# Patient Record
Sex: Male | Born: 1955
Health system: Southern US, Community
[De-identification: ages and names within clinical notes are randomized; demographics above are authoritative.]

## PROBLEM LIST (undated history)

## (undated) DIAGNOSIS — I255 Ischemic cardiomyopathy: Secondary | ICD-10-CM

## (undated) DIAGNOSIS — E785 Hyperlipidemia, unspecified: Secondary | ICD-10-CM

## (undated) DIAGNOSIS — Z95810 Presence of automatic (implantable) cardiac defibrillator: Secondary | ICD-10-CM

## (undated) DIAGNOSIS — I10 Essential (primary) hypertension: Secondary | ICD-10-CM

## (undated) DIAGNOSIS — H547 Unspecified visual loss: Secondary | ICD-10-CM

## (undated) DIAGNOSIS — I5022 Chronic systolic (congestive) heart failure: Secondary | ICD-10-CM

## (undated) DIAGNOSIS — I2510 Atherosclerotic heart disease of native coronary artery without angina pectoris: Secondary | ICD-10-CM

## (undated) DIAGNOSIS — F32A Depression, unspecified: Secondary | ICD-10-CM

## (undated) DIAGNOSIS — F329 Major depressive disorder, single episode, unspecified: Secondary | ICD-10-CM

## (undated) DIAGNOSIS — F819 Developmental disorder of scholastic skills, unspecified: Secondary | ICD-10-CM

## (undated) HISTORY — DX: Essential (primary) hypertension: I10

## (undated) HISTORY — DX: Ischemic cardiomyopathy: I25.5

## (undated) HISTORY — DX: Major depressive disorder, single episode, unspecified: F32.9

## (undated) HISTORY — PX: HAND SURGERY: SHX662

## (undated) HISTORY — DX: Atherosclerotic heart disease of native coronary artery without angina pectoris: I25.10

## (undated) HISTORY — DX: Depression, unspecified: F32.A

## (undated) HISTORY — DX: Hyperlipidemia, unspecified: E78.5

## (undated) HISTORY — DX: Chronic systolic (congestive) heart failure: I50.22

## (undated) HISTORY — DX: Unspecified visual loss: H54.7

## (undated) HISTORY — DX: Presence of automatic (implantable) cardiac defibrillator: Z95.810

## (undated) HISTORY — DX: Developmental disorder of scholastic skills, unspecified: F81.9

---

## 2009-03-07 HISTORY — PX: OTHER SURGICAL HISTORY: SHX169

## 2009-04-03 ENCOUNTER — Ambulatory Visit: Payer: Self-pay | Admitting: Cardiovascular Disease

## 2009-04-03 ENCOUNTER — Inpatient Hospital Stay (HOSPITAL_COMMUNITY): Admission: EM | Admit: 2009-04-03 | Discharge: 2009-04-07 | Payer: Self-pay | Admitting: Cardiology

## 2009-04-04 ENCOUNTER — Encounter: Payer: Self-pay | Admitting: Cardiology

## 2009-04-16 ENCOUNTER — Encounter: Payer: Self-pay | Admitting: Cardiology

## 2009-04-17 ENCOUNTER — Ambulatory Visit: Payer: Self-pay | Admitting: Cardiology

## 2009-04-17 DIAGNOSIS — E1110 Type 2 diabetes mellitus with ketoacidosis without coma: Secondary | ICD-10-CM | POA: Insufficient documentation

## 2009-04-17 DIAGNOSIS — I255 Ischemic cardiomyopathy: Secondary | ICD-10-CM

## 2009-04-19 LAB — CONVERTED CEMR LAB
BUN: 20 mg/dL (ref 6–23)
CO2: 28 meq/L (ref 19–32)
Calcium: 9.1 mg/dL (ref 8.4–10.5)
Chloride: 102 meq/L (ref 96–112)
Creatinine, Ser: 1 mg/dL (ref 0.4–1.5)
GFR calc non Af Amer: 82.93 mL/min (ref >60)
Glucose, Bld: 90 mg/dL (ref 70–99)
Potassium: 4.7 meq/L (ref 3.5–5.1)
Sodium: 136 meq/L (ref 135–145)

## 2009-04-23 ENCOUNTER — Telehealth (INDEPENDENT_AMBULATORY_CARE_PROVIDER_SITE_OTHER): Payer: Self-pay | Admitting: *Deleted

## 2009-04-24 ENCOUNTER — Ambulatory Visit: Payer: Self-pay | Admitting: Cardiology

## 2009-04-25 ENCOUNTER — Telehealth: Payer: Self-pay | Admitting: Cardiology

## 2009-05-05 ENCOUNTER — Encounter: Payer: Self-pay | Admitting: Cardiology

## 2009-05-06 ENCOUNTER — Ambulatory Visit: Payer: Self-pay

## 2009-05-06 ENCOUNTER — Ambulatory Visit: Payer: Self-pay | Admitting: Cardiology

## 2009-05-06 DIAGNOSIS — I10 Essential (primary) hypertension: Secondary | ICD-10-CM

## 2009-05-14 ENCOUNTER — Telehealth (INDEPENDENT_AMBULATORY_CARE_PROVIDER_SITE_OTHER): Payer: Self-pay | Admitting: *Deleted

## 2009-05-14 LAB — CONVERTED CEMR LAB
BUN: 15 mg/dL (ref 6–23)
CO2: 29 meq/L (ref 19–32)
Calcium: 9.4 mg/dL (ref 8.4–10.5)
Chloride: 107 meq/L (ref 96–112)
Creatinine, Ser: 0.9 mg/dL (ref 0.4–1.5)
GFR calc non Af Amer: 93.63 mL/min (ref >60)
Glucose, Bld: 134 mg/dL — ABNORMAL HIGH (ref 70–99)
Potassium: 4.6 meq/L (ref 3.5–5.1)
Sodium: 140 meq/L (ref 135–145)

## 2009-05-20 ENCOUNTER — Telehealth: Payer: Self-pay | Admitting: Cardiology

## 2009-05-20 ENCOUNTER — Ambulatory Visit: Payer: Self-pay | Admitting: Cardiology

## 2009-05-20 DIAGNOSIS — E785 Hyperlipidemia, unspecified: Secondary | ICD-10-CM

## 2009-05-30 ENCOUNTER — Encounter: Payer: Self-pay | Admitting: Cardiology

## 2009-06-06 ENCOUNTER — Ambulatory Visit: Payer: Self-pay | Admitting: Cardiology

## 2009-06-06 ENCOUNTER — Encounter: Payer: Self-pay | Admitting: Cardiology

## 2009-06-06 ENCOUNTER — Telehealth (INDEPENDENT_AMBULATORY_CARE_PROVIDER_SITE_OTHER): Payer: Self-pay | Admitting: *Deleted

## 2009-06-06 DIAGNOSIS — I2510 Atherosclerotic heart disease of native coronary artery without angina pectoris: Secondary | ICD-10-CM

## 2009-06-06 DIAGNOSIS — I5022 Chronic systolic (congestive) heart failure: Secondary | ICD-10-CM

## 2009-06-11 LAB — CONVERTED CEMR LAB
Alkaline Phosphatase: 43 units/L (ref 39–117)
BUN: 17 mg/dL (ref 6–23)
Bilirubin, Direct: 0 mg/dL (ref 0.0–0.3)
CO2: 28 meq/L (ref 19–32)
Chloride: 102 meq/L (ref 96–112)
Creatinine, Ser: 0.9 mg/dL (ref 0.4–1.5)
Glucose, Bld: 105 mg/dL — ABNORMAL HIGH (ref 70–99)
LDL Cholesterol: 35 mg/dL (ref 0–99)
Total Bilirubin: 0.7 mg/dL (ref 0.3–1.2)
Total CHOL/HDL Ratio: 2

## 2009-06-27 ENCOUNTER — Telehealth (INDEPENDENT_AMBULATORY_CARE_PROVIDER_SITE_OTHER): Payer: Self-pay | Admitting: *Deleted

## 2009-07-04 ENCOUNTER — Encounter: Payer: Self-pay | Admitting: Cardiology

## 2009-07-10 ENCOUNTER — Telehealth: Payer: Self-pay | Admitting: Cardiology

## 2009-07-17 ENCOUNTER — Telehealth (INDEPENDENT_AMBULATORY_CARE_PROVIDER_SITE_OTHER): Payer: Self-pay | Admitting: *Deleted

## 2009-07-18 ENCOUNTER — Encounter: Payer: Self-pay | Admitting: Cardiology

## 2009-07-22 ENCOUNTER — Telehealth: Payer: Self-pay | Admitting: Cardiology

## 2009-07-22 ENCOUNTER — Encounter: Payer: Self-pay | Admitting: Cardiology

## 2009-07-22 ENCOUNTER — Telehealth (INDEPENDENT_AMBULATORY_CARE_PROVIDER_SITE_OTHER): Payer: Self-pay | Admitting: *Deleted

## 2009-08-14 ENCOUNTER — Ambulatory Visit: Payer: Self-pay | Admitting: Cardiology

## 2009-09-20 ENCOUNTER — Telehealth (INDEPENDENT_AMBULATORY_CARE_PROVIDER_SITE_OTHER): Payer: Self-pay | Admitting: *Deleted

## 2009-10-10 ENCOUNTER — Telehealth: Payer: Self-pay | Admitting: Cardiology

## 2009-10-14 ENCOUNTER — Telehealth: Payer: Self-pay | Admitting: Cardiology

## 2009-10-14 ENCOUNTER — Encounter: Payer: Self-pay | Admitting: Cardiology

## 2009-10-17 ENCOUNTER — Telehealth: Payer: Self-pay | Admitting: Cardiology

## 2009-11-07 ENCOUNTER — Encounter: Payer: Self-pay | Admitting: Cardiology

## 2009-11-15 ENCOUNTER — Telehealth: Payer: Self-pay | Admitting: Cardiology

## 2009-11-25 ENCOUNTER — Telehealth (INDEPENDENT_AMBULATORY_CARE_PROVIDER_SITE_OTHER): Payer: Self-pay | Admitting: *Deleted

## 2009-11-25 ENCOUNTER — Ambulatory Visit: Payer: Self-pay | Admitting: Cardiology

## 2009-12-02 LAB — CONVERTED CEMR LAB
Bilirubin, Direct: 0.1 mg/dL (ref 0.0–0.3)
Cholesterol: 92 mg/dL (ref 0–200)
LDL Cholesterol: 29 mg/dL (ref 0–99)
Total Bilirubin: 0.6 mg/dL (ref 0.3–1.2)
Total CHOL/HDL Ratio: 2
Total Protein: 7 g/dL (ref 6.0–8.3)
VLDL: 19.2 mg/dL (ref 0.0–40.0)

## 2009-12-11 ENCOUNTER — Telehealth (INDEPENDENT_AMBULATORY_CARE_PROVIDER_SITE_OTHER): Payer: Self-pay | Admitting: *Deleted

## 2009-12-12 ENCOUNTER — Encounter: Payer: Self-pay | Admitting: Cardiology

## 2009-12-16 ENCOUNTER — Telehealth: Payer: Self-pay | Admitting: Cardiology

## 2009-12-18 ENCOUNTER — Encounter: Payer: Self-pay | Admitting: Cardiology

## 2010-02-24 ENCOUNTER — Telehealth (INDEPENDENT_AMBULATORY_CARE_PROVIDER_SITE_OTHER): Payer: Self-pay | Admitting: *Deleted

## 2010-02-24 ENCOUNTER — Ambulatory Visit: Payer: Self-pay | Admitting: Cardiology

## 2010-02-27 ENCOUNTER — Telehealth: Payer: Self-pay | Admitting: Cardiology

## 2010-03-13 ENCOUNTER — Telehealth: Payer: Self-pay | Admitting: Cardiology

## 2010-03-14 ENCOUNTER — Ambulatory Visit: Payer: Self-pay | Admitting: Cardiology

## 2010-03-14 LAB — CONVERTED CEMR LAB
BUN: 26 mg/dL — ABNORMAL HIGH (ref 6–23)
Chloride: 106 meq/L (ref 96–112)
Potassium: 4.6 meq/L (ref 3.5–5.1)
Pro B Natriuretic peptide (BNP): 45.4 pg/mL (ref 0.0–100.0)

## 2010-03-31 ENCOUNTER — Telehealth: Payer: Self-pay | Admitting: Cardiology

## 2010-04-21 ENCOUNTER — Telehealth: Payer: Self-pay | Admitting: Cardiology

## 2010-05-05 ENCOUNTER — Telehealth: Payer: Self-pay | Admitting: Cardiology

## 2010-05-29 ENCOUNTER — Encounter: Payer: Self-pay | Admitting: Cardiology

## 2010-05-29 ENCOUNTER — Ambulatory Visit (HOSPITAL_COMMUNITY): Admission: RE | Admit: 2010-05-29 | Discharge: 2010-05-29 | Payer: Self-pay | Admitting: Cardiology

## 2010-05-29 ENCOUNTER — Ambulatory Visit: Payer: Self-pay | Admitting: Internal Medicine

## 2010-05-29 ENCOUNTER — Ambulatory Visit: Payer: Self-pay | Admitting: Cardiology

## 2010-05-29 ENCOUNTER — Ambulatory Visit: Payer: Self-pay

## 2010-06-11 ENCOUNTER — Encounter (INDEPENDENT_AMBULATORY_CARE_PROVIDER_SITE_OTHER): Payer: Self-pay | Admitting: *Deleted

## 2010-06-17 ENCOUNTER — Encounter: Payer: Self-pay | Admitting: Cardiology

## 2010-06-23 ENCOUNTER — Telehealth: Payer: Self-pay | Admitting: Cardiology

## 2010-06-23 ENCOUNTER — Ambulatory Visit: Payer: Self-pay | Admitting: Cardiovascular Disease

## 2010-06-23 ENCOUNTER — Ambulatory Visit (HOSPITAL_COMMUNITY): Admission: RE | Admit: 2010-06-23 | Discharge: 2010-06-23 | Payer: Self-pay | Admitting: Cardiology

## 2010-06-27 ENCOUNTER — Ambulatory Visit: Payer: Self-pay | Admitting: Internal Medicine

## 2010-06-30 ENCOUNTER — Telehealth: Payer: Self-pay | Admitting: Internal Medicine

## 2010-07-01 ENCOUNTER — Encounter: Payer: Self-pay | Admitting: Cardiology

## 2010-07-01 ENCOUNTER — Encounter: Payer: Self-pay | Admitting: Internal Medicine

## 2010-07-01 ENCOUNTER — Ambulatory Visit (HOSPITAL_BASED_OUTPATIENT_CLINIC_OR_DEPARTMENT_OTHER): Admission: RE | Admit: 2010-07-01 | Discharge: 2010-07-01 | Payer: Self-pay | Admitting: Cardiology

## 2010-07-02 ENCOUNTER — Encounter: Payer: Self-pay | Admitting: Internal Medicine

## 2010-07-08 ENCOUNTER — Ambulatory Visit: Payer: Self-pay | Admitting: Internal Medicine

## 2010-07-09 ENCOUNTER — Ambulatory Visit: Payer: Self-pay | Admitting: Pulmonary Disease

## 2010-07-11 ENCOUNTER — Telehealth: Payer: Self-pay | Admitting: Cardiology

## 2010-07-11 ENCOUNTER — Ambulatory Visit: Payer: Self-pay | Admitting: Internal Medicine

## 2010-07-11 LAB — CONVERTED CEMR LAB
Basophils Relative: 0.3 % (ref 0.0–3.0)
CO2: 26 meq/L (ref 19–32)
Chloride: 102 meq/L (ref 96–112)
Creatinine, Ser: 0.8 mg/dL (ref 0.4–1.5)
Eosinophils Absolute: 0.2 10*3/uL (ref 0.0–0.7)
Hemoglobin: 13.9 g/dL (ref 13.0–17.0)
Lymphs Abs: 2.1 10*3/uL (ref 0.7–4.0)
MCHC: 34.2 g/dL (ref 30.0–36.0)
MCV: 96.4 fL (ref 78.0–100.0)
Monocytes Absolute: 0.5 10*3/uL (ref 0.1–1.0)
Neutro Abs: 3.4 10*3/uL (ref 1.4–7.7)
RBC: 4.22 M/uL (ref 4.22–5.81)

## 2010-07-14 ENCOUNTER — Telehealth: Payer: Self-pay | Admitting: Cardiology

## 2010-07-14 DIAGNOSIS — G4730 Sleep apnea, unspecified: Secondary | ICD-10-CM | POA: Insufficient documentation

## 2010-07-18 ENCOUNTER — Ambulatory Visit (HOSPITAL_COMMUNITY): Admission: RE | Admit: 2010-07-18 | Discharge: 2010-07-19 | Payer: Self-pay | Admitting: Internal Medicine

## 2010-07-18 ENCOUNTER — Ambulatory Visit: Payer: Self-pay | Admitting: Internal Medicine

## 2010-07-18 HISTORY — PX: OTHER SURGICAL HISTORY: SHX169

## 2010-07-22 ENCOUNTER — Telehealth: Payer: Self-pay | Admitting: Cardiology

## 2010-07-23 ENCOUNTER — Encounter: Payer: Self-pay | Admitting: Internal Medicine

## 2010-07-24 ENCOUNTER — Telehealth (INDEPENDENT_AMBULATORY_CARE_PROVIDER_SITE_OTHER): Payer: Self-pay | Admitting: *Deleted

## 2010-07-30 ENCOUNTER — Encounter: Payer: Self-pay | Admitting: Internal Medicine

## 2010-07-30 ENCOUNTER — Ambulatory Visit: Payer: Self-pay

## 2010-08-13 ENCOUNTER — Ambulatory Visit: Payer: Self-pay | Admitting: Pulmonary Disease

## 2010-08-13 DIAGNOSIS — I219 Acute myocardial infarction, unspecified: Secondary | ICD-10-CM | POA: Insufficient documentation

## 2010-08-13 DIAGNOSIS — G4733 Obstructive sleep apnea (adult) (pediatric): Secondary | ICD-10-CM

## 2010-08-28 ENCOUNTER — Telehealth: Payer: Self-pay | Admitting: Internal Medicine

## 2010-09-12 ENCOUNTER — Encounter: Payer: Self-pay | Admitting: Pulmonary Disease

## 2010-09-17 ENCOUNTER — Ambulatory Visit
Admission: RE | Admit: 2010-09-17 | Discharge: 2010-09-17 | Payer: Self-pay | Source: Home / Self Care | Attending: Cardiology | Admitting: Cardiology

## 2010-09-17 ENCOUNTER — Other Ambulatory Visit: Payer: Self-pay | Admitting: Cardiology

## 2010-09-17 ENCOUNTER — Encounter: Payer: Self-pay | Admitting: Cardiology

## 2010-09-17 ENCOUNTER — Encounter: Payer: Self-pay | Admitting: Internal Medicine

## 2010-09-17 LAB — BASIC METABOLIC PANEL
BUN: 16 mg/dL (ref 6–23)
CO2: 28 mEq/L (ref 19–32)
Calcium: 9.1 mg/dL (ref 8.4–10.5)
Chloride: 104 mEq/L (ref 96–112)
Creatinine, Ser: 0.7 mg/dL (ref 0.4–1.5)
GFR: 124.49 mL/min (ref >60.00)
Glucose, Bld: 164 mg/dL — ABNORMAL HIGH (ref 70–99)
Potassium: 4.8 mEq/L (ref 3.5–5.1)
Sodium: 140 mEq/L (ref 135–145)

## 2010-09-17 LAB — BRAIN NATRIURETIC PEPTIDE: Pro B Natriuretic peptide (BNP): 39.6 pg/mL (ref 0.0–100.0)

## 2010-09-25 ENCOUNTER — Telehealth (INDEPENDENT_AMBULATORY_CARE_PROVIDER_SITE_OTHER): Payer: Self-pay | Admitting: *Deleted

## 2010-09-25 ENCOUNTER — Telehealth: Payer: Self-pay | Admitting: Cardiology

## 2010-10-06 ENCOUNTER — Ambulatory Visit
Admission: RE | Admit: 2010-10-06 | Discharge: 2010-10-06 | Payer: Self-pay | Source: Home / Self Care | Attending: Pulmonary Disease | Admitting: Pulmonary Disease

## 2010-10-06 ENCOUNTER — Telehealth (INDEPENDENT_AMBULATORY_CARE_PROVIDER_SITE_OTHER): Payer: Self-pay | Admitting: *Deleted

## 2010-10-07 NOTE — Progress Notes (Signed)
Summary: req to speak to nurse  Phone Note Call from Patient Call back at [phone]   Caller: Spouse Reason for Call: Talk to Nurse Summary of Call: req to speak to nurse Initial call taken by: Migdalia Dk,  October 14, 2009 10:17 AM  Follow-up for Phone Call        aware Plavix has been ordered thru [facility] and should be here soon. Follow-up by: Charolotte Capuchin, RN,  October 14, 2009 10:46 AM

## 2010-10-07 NOTE — Assessment & Plan Note (Signed)
Summary: 3 month rov echo at 9:30   Visit Type:  3 months follow up Primary Latasia Silberstein:  Dr. Andrey Campanile, [facility])  CC:  Some chest pains.  History of Present Illness: 55 yo presents for followup after anterior MI and development of ischemic cardiomyopathy.  Patient came to [facility] 7/10 with an acute anterior MI.  He had a totally occluded proximal LAD.  This was opened with a Xience DES. Repeat echo today showed EF 35-40% with apical and anterior/anterolatera/anteroseptal hypokinesis.  He is stable symptomatically.  He has some exertional dyspnea that tends to be brought on by carrying a heavy bag of groceries into his house or walking more than about 1/4 mile and does not occur with less significant exertion.  He has occasional quite atypical chest pain.   He does report fatigue, daytime sleepiness, and snoring.  He gasps and stops breathing on occasion in his sleep.    Labs (9/10): BNP 103, HDL 44, LDL 35 Labs (10/10): K 4.1, creatinine 0.75 Labs (3/11): LDL 29, HDL 44, LFTs normal Labs (1/02): K 4.7, creatinine 0.88 Labs (7/11): K 4.6, creatinine 0.7, BNP 45  ECG: NSR, old ASMI  Current Medications (verified): 1)  Aspirin 325 Mg Tabs (Aspirin) .... Once Daily 2)  Coreg 12.5 Mg Tabs (Carvedilol) .... One and One-Half Twice A Day 3)  Plavix 75 Mg Tabs (Clopidogrel Bisulfate) .... Take One Tablet Once Daily 4)  Enalapril Maleate 10 Mg Tabs (Enalapril Maleate) .... One Tablet Twice A Day 5)  Furosemide 40 Mg Tabs (Furosemide) .... (1/2) Every Day 6)  Glipizide 10 Mg Tabs (Glipizide) .... Take One Tablet Two Times A Day 7)  Nitroglycerin 0.4 Mg Subl (Nitroglycerin) .... As Needed 8)  Crestor 40 Mg Tabs (Rosuvastatin Calcium) .... One Daily 9)  Spironolactone 25 Mg Tabs (Spironolactone) .... Once Daily 10)  Metformin Hcl 500 Mg Tabs (Metformin Hcl) .... 2 Daily 11)  Flaxseed Oil 1200 Mg Caps (Flaxseed (Linseed)) .... Take One Capsule Once Daily 12)  Sertraline Hcl 50 Mg Tabs  (Sertraline Hcl) .... Take One Tablet At Bedtime  Allergies (verified): No Known Drug Allergies  Past History:  Past Medical History: 1.  CAD: Anterior STEMI 04/03/09.  3.0 x 15 mm Xience DES to totally occluded proximal LAD.  No obstructive disease elsewhere.   2.  Ischemic CMP: Echo (04/04/09) with EF 25-30%, anteroseptal, anterior, anterolateral, and apical akinesis.  Followup echo (9/10) with EF 35-40%, apical and anteroseptal hypokinesis, mild MR, grade II diastolic dysfunction.  3.  Diabetes mellitus 4.  Learning disability 5.  Hyperlipidemia: myalgias with Lipitor 6.  Depression: Seeing a psychiatrist in [place] 7.  Suspect OSA  Family History: Reviewed history from 04/24/2009 and no changes required. Father died with a history of diabetes, but no history of coronary artery disease.  Mother had some heart problems-unspecified Brother died of an MI at age 83  Social History: Reviewed history from 08/14/2009 and no changes required. Tobacco Use - No.  Alcohol Use - no Drug Use - no Lives in [place].   Review of Systems       All systems reviewed and negative except as per HPI.   Vital Signs:  Patient profile:   55 year old male Height:      68 inches Weight:      219 pounds BMI:     33.42 Pulse rate:   55 / minute Pulse rhythm:   regular Resp:     18 per minute BP sitting:  112 / 80  (left arm) Cuff size:   large  Vitals Entered By: Vikki Ports (May 29, 2010 10:30 AM)  Physical Exam  General:  Well developed, well nourished, in no acute distress. Neck:  Neck thick, no JVD. No masses, thyromegaly or abnormal cervical nodes. Lungs:  Clear bilaterally to auscultation and percussion. Heart:  Non-displaced PMI, chest non-tender; regular rate and rhythm, S1, S2 without murmurs, rubs or gallops. Carotid upstroke normal, no bruit.  Pedals normal pulses. No edema, no varicosities. Abdomen:  Bowel sounds positive; abdomen soft and non-tender without masses,  organomegaly, or hernias noted. No hepatosplenomegaly. Extremities:  No clubbing or cyanosis. Neurologic:  Alert and oriented x 3. Psych:  Normal affect.   Impression & Recommendations:  Problem # 1:  CHRONIC SYSTOLIC HEART FAILURE (ICD-428.22) Euvolemic with NYHA class II symptoms.  Continue current Lasix, Coreg, and spironolactone.  Will increase enalapril to 15 mg two times a day.  BMET in 2 wks.  EF on echo today was 35-40%.  He is borderline for ICD.  He now has insurance so will be able to get coverage for cardiac MRI.  Will arrange MRI to quantify EF, if < 35% will suggest ICD.   Problem # 2:  CORONARY ATHEROSCLEROSIS NATIVE CORONARY ARTERY (ICD-414.01) Atypical chest pain only.  - Continue ASA, Plavix, ACEI, statin, Coreg.  - Lipids/LFTs when he gets his BMET in 2 wks.  Goal LDL < 70.   Problem # 3:  POSSIBLE OSA Patient reports daytime sleepiness and fatigue.  His wife states that he snores loudly, gasps, and stops breathing while asleep.  Will arrange for sleep study.   Other Orders: Cardiac MRI (Cardiac MRI) Misc. Referral (Misc. Ref)  Patient Instructions: 1)  Your physician has recommended you make the following change in your medication:  2)  Increase Enalapril to 15mg  twice a day--this will be one and one-half 10mg  tablets twice a day. 3)  Your physician recommends that you return for a FASTING lipid profile/liver profile/BMP in 2 weeks---414.01  428.22-you have the order. 4)  Your physician has recommended that you have a sleep study.  This test records several body functions during sleep, including:  brain activity, eye movement, oxygen and carbon dioxide blood levels, heart rate and rhythm, breathing rate and rhythm, the flow of air through your mouth and nose, snoring, body muscle movements, and chest and belly movement. 5)  Your physician has requested that you have a cardiac MRI.  Cardiac MRI uses a computer to create images of your heart as it's beating, producing  both still and moving pictures of your heart and major blood vessels. For further information please visit  [email or web address]  Please follow the instruction sheet given to you today for more information. 6)  Your physician wants you to follow-up in: 4 months with Dr Shirlee Latch.  You will receive a reminder letter in the mail two months in advance. If you don't receive a letter, please call our office to schedule the follow-up appointment. Prescriptions: ENALAPRIL MALEATE 10 MG TABS (ENALAPRIL MALEATE) one and one- half  tablet twice a day  #90 x 6   Entered by:   Katina Dung, RN, BSN   Authorized by:   Marca Ancona, MD   Signed by:   Katina Dung, RN, BSN on 05/29/2010   Method used:   Electronically to        [facility]* (retail)       [address]  [place], [place]  [address]       Ph: [phone]       Fax: [phone]   RxID:   [id number]

## 2010-10-07 NOTE — Progress Notes (Signed)
Summary: discuss meds  Phone Note Call from Patient Call back at Home Phone [phone]   Caller: Patient Reason for Call: Talk to Nurse Details for Reason: Discuss meds.  Summary of Call: Will have dental surg. in the near future.  Need to discuss steps to come off blood thinners. Initial call taken by: Omar Person,  July 11, 2010 10:52 AM     Appended Document: discuss meds May stop Plavix peri-op, then restart.  Continue ASA 81 mg daily if possible.   Appended Document: discuss meds I talked with pt by telephone and gave him Dr Alford Highland recommendations--pt verbalized understanding

## 2010-10-07 NOTE — Progress Notes (Signed)
  pt signed ROI while in Office for records to be faxed to [facility] Health & Human Services sent to [employer]. Cala Bradford Mesiemore  February 24, 2010 9:51 AM

## 2010-10-07 NOTE — Assessment & Plan Note (Signed)
Summary: eval for icd per mclean/sl   Visit Type:  Initial Consult Referring Provider:  Dr Shirlee Latch Primary Provider:  Dr. Andrey Campanile, [facility])   History of Present Illness: Zachary Berry is a pleasant 55 yo WM with a h/o CAD s/p PCI, Ischemic CM (EF 34%) and NYHA Class II/III CHF who presents for EP consultation regarding risk stratefication for sudden death.  He reports being in good health until 7/10 when he had an acute anterior MI.  He had a totally occluded proximal LAD.  This was opened with a Xience DES. Repeat echo subsequently showed EF 35-40% with apical and anterior/anterolatera/anteroseptal hypokinesis.  MRI 06/23/10 revealed full thickness scar involving distal anterior wall, septum and apex with 2/3 rd thickness scar involving the mid and basal anterior wall.  His EF by MRI is 34%. Despite his ischemic CM, he appears to be doing reasonably well.  He does reports exertional dyspnea that tends to be brought on by carrying a heavy bag of groceries into his house or walking more than about 1/4 mile and does not occur with less significant exertion.  He has occasional quite atypical chest pain.   He does report fatigue, daytime sleepiness, and snoring.  He gasps and stops breathing on occasion in his sleep.  He has a sleep study scheduled.      Current Medications (verified): 1)  Aspirin 325 Mg Tabs (Aspirin) .... Once Daily 2)  Coreg 12.5 Mg Tabs (Carvedilol) .... One and One-Half Twice A Day 3)  Plavix 75 Mg Tabs (Clopidogrel Bisulfate) .... Take One Tablet Once Daily 4)  Enalapril Maleate 10 Mg Tabs (Enalapril Maleate) .... One and One- Half  Tablet Twice A Day 5)  Furosemide 40 Mg Tabs (Furosemide) .... (1/2) Every Day 6)  Glipizide 10 Mg Tabs (Glipizide) .... Take One Tablet Two Times A Day 7)  Nitroglycerin 0.4 Mg Subl (Nitroglycerin) .... As Needed 8)  Crestor 40 Mg Tabs (Rosuvastatin Calcium) .... One Daily 9)  Spironolactone 25 Mg Tabs (Spironolactone) .... Once  Daily 10)  Metformin Hcl 500 Mg Tabs (Metformin Hcl) .... 2 Daily 11)  Flaxseed Oil 1200 Mg Caps (Flaxseed (Linseed)) .... Take One Capsule Once Daily 12)  Sertraline Hcl 50 Mg Tabs (Sertraline Hcl) .... Take One Tablet At Bedtime  Allergies (verified): No Known Drug Allergies  Past History:  Past Medical History: 1.  CAD: Anterior STEMI 04/03/09.  3.0 x 15 mm Xience DES to totally occluded proximal LAD.  No obstructive disease elsewhere.   2.  Ischemic CMP: Echo (04/04/09) with EF 25-30%, anteroseptal, anterior, anterolateral, and apical akinesis.  Followup echo (9/10) with EF 35-40%, apical and anteroseptal hypokinesis, mild Zachary, grade II diastolic dysfunction.  3.  Diabetes mellitus 4.  Learning disability 5.  Hyperlipidemia: myalgias with Lipitor 6.  Depression: Seeing a psychiatrist in [place] 7.  Suspect OSA (awaiting sleep study) 8.  Very poor vision  Past Surgical History: PCI LAD 7/10 Hand Surgery  Family History: Father died with a history of diabetes, but no history of coronary artery disease.  Mother had some heart problems-unspecified Brother died of an MI at age 38.  Another brother had CABG at age 57  Social History: Tobacco Use - No.  Alcohol Use - no Drug Use - no Lives in [place].   Disabled due  CAD.  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  Review of Systems       All systems are reviewed and negative except  as listed in the HPI.   Vital Signs:  Patient profile:   55 year old male Height:      68 inches Weight:      225 pounds BMI:     34.33 Pulse rate:   65 / minute BP sitting:   102 / 60  (left arm)  Vitals Entered By: Laurance Flatten CMA (June 27, 2010 2:19 PM)  Physical Exam  General:  Well developed, well nourished, in no acute distress. Head:  normocephalic and atraumatic Eyes:  PERRLA/EOM intact; conjunctiva and lids normal. Mouth:  Teeth, gums and palate normal. Oral mucosa normal. Neck:   supple, JVP 8cm Lungs:  Clear bilaterally to auscultation and percussion. Heart:  Non-displaced PMI, chest non-tender; regular rate and rhythm, S1, S2 without murmurs, rubs or gallops. Carotid upstroke normal, no bruit. Normal abdominal aortic size, no bruits. Femorals normal pulses, no bruits. Pedals normal pulses. No edema, no varicosities. Abdomen:  Bowel sounds positive; abdomen soft and non-tender without masses, organomegaly, or hernias noted. No hepatosplenomegaly. Msk:  Back normal, normal gait. Muscle strength and tone normal. Pulses:  pulses normal in all 4 extremities Extremities:  No clubbing or cyanosis. Neurologic:  Alert and oriented x 3. Skin:  Intact without lesions or rashes. Cervical Nodes:  no significant adenopathy Psych:  Normal affect.   EKG  Procedure date:  05/29/2010  Findings:      sinus bradycardia 55 bpm, PR 172, QRS 94, QTc 410, anterior infarction  Impression & Recommendations:  Problem # 1:  CHRONIC SYSTOLIC HEART FAILURE (ICD-428.22) The patient has an ischemic CM (EF 34%), NYHA Class II/III CHF, and CAD.  He meets MADIT II/ SCD-HeFT criteria for ICD implantation for primary prevention of sudden death.  Risks, benefits, alternatives to ICD implantation were discussed in detail with the patient today.  He understands that the risks include but are not limited to bleeding, infection, pneumothorax, perforation, tamponade, vascular damage, renal failure, MI, stroke, death, inappropriate shocks, and lead dislodgement.  The patient wishes to further contemplate ICD implantation and will contact our office if he decides to proceed.  He is left handed and wishes to be able to shoot a gun with the butt of the gun over his L side.  Consideration would therefore be given to R sided device placement.  Given sinus bradycardia, I would plan dual chamber ICD implantation if he chooses to proceed.  Problem # 2:  ISCHEMIC CARDIOMYOPATHY (ICD-414.8) stable no changes  today  Problem # 3:  HYPERTENSION (ICD-401.9) stable  Patient Instructions: 1)  The patient will contact our office if he decided to have an ICD implanted.  He will otherwise continue to follow-up with Dr Shirlee Latch.

## 2010-10-07 NOTE — Progress Notes (Signed)
Summary: Plavix ready for pickup   Phone Note Outgoing Call   Call placed by: Katina Dung, RN, BSN,  April 21, 2010 10:22 AM Call placed to: Patient Summary of Call: Plavix ready for pick-up   Follow-up for Phone Call        received Plavix 75mg   #90 tablets from [facility]--pt aware at front desk ready for pick-up Katina Dung, RN, BSN  April 21, 2010 10:23 AM

## 2010-10-07 NOTE — Progress Notes (Signed)
Summary: deined for disability  Phone Note Call from Patient Call back at Home Phone [phone]   Caller: Patient Reason for Call: Talk to Nurse Summary of Call: per pt called, pt wanted anne to know he was  denied for his disability.  Initial call taken by: Lorne Skeens,  February 27, 2010 3:54 PM  Follow-up for Phone Call        left message to call back. Dossie Arbour, RN, BSN  February 27, 2010 3:58 PM  returning call, Migdalia Dk  February 27, 2010 4:02 PM   I spoke with the pt. He just wanted Thurston Hole to know he was denied for Social Security Disability. I explained to the pt we would forward this message to [place]. Follow-up by: Sherri Rad, RN, BSN,  February 27, 2010 4:14 PM  Additional Follow-up for Phone Call Additional follow up Details #1::        talked with pt-he was denied for Social Security disability  but is still waiting to hear about Medicaid and completed paperwork and sent into [facility] billing for help with his medical bills

## 2010-10-07 NOTE — Medication Information (Signed)
Summary: Plavix  Plavix   Imported By: Marylou Mccoy 10/31/2009 16:58:09  _____________________________________________________________________  External Attachment:    Type:   Image     Comment:   External Document

## 2010-10-07 NOTE — Procedures (Signed)
Summary: wound ck 10 day/mt   Current Medications (verified): 1)  Aspirin 325 Mg Tabs (Aspirin) .... Once Daily 2)  Coreg 12.5 Mg Tabs (Carvedilol) .... One and One-Half Twice A Day 3)  Plavix 75 Mg Tabs (Clopidogrel Bisulfate) .... Take One Tablet Once Daily 4)  Enalapril Maleate 10 Mg Tabs (Enalapril Maleate) .... One and One- Half  Tablet Twice A Day 5)  Furosemide 40 Mg Tabs (Furosemide) .... (1/2) Every Day 6)  Glipizide 10 Mg Tabs (Glipizide) .... Take One Tablet Two Times A Day 7)  Nitroglycerin 0.4 Mg Subl (Nitroglycerin) .... As Needed 8)  Crestor 40 Mg Tabs (Rosuvastatin Calcium) .... One Daily 9)  Spironolactone 25 Mg Tabs (Spironolactone) .... Once Daily 10)  Metformin Hcl1000 Mg Tabs (Metformin Hcl) .... One By Mouth Two Times A Day 11)  Flaxseed Oil 1200 Mg Caps (Flaxseed (Linseed)) .... Take One Capsule Once Daily 12)  Sertraline Hcl 50 Mg Tabs (Sertraline Hcl) .... Take One Tablet At Bedtime  Allergies (verified): No Known Drug Allergies   ICD Specifications Following MD:  Hillis Range, MD     ICD Vendor:  [employer]     ICD Model Number:  [id number]     ICD Serial Number:  [id number] H ICD DOI:  07/18/2010     ICD Implanting MD:  Hillis Range, MD  Lead 1:    Location: RA     DOI: 07/18/2010     Model #: 4098     Serial #: [id number]     Status: active Lead 2:    Location: RV     DOI: 07/18/2010     Model #: 6213     Serial #: [id number]     Status: active  Indications::  ICM   ICD Follow Up Remote Check?  No Battery Voltage:  3.21 V     Charge Time:  8.4 seconds     Underlying rhythm:  SR ICD Dependent:  No       ICD Device Measurements Atrium:  Amplitude: 3.6 mV, Impedance: 513 ohms, Threshold: 0.5 V at 0.4 msec Right Ventricle:  Amplitude: 15.6 mV, Impedance: 532 ohms, Threshold: 0.75 V at 0.4 msec Shock Impedance: 54/64 ohms   Episodes MS Episodes:  0     Percent Mode Switch:  0     Coumadin:  No Shock:  0     ATP:  0     Nonsustained:  0     Atrial Pacing:   19.7%     Ventricular Pacing:  0.2%  Brady Parameters Mode DDD+     Lower Rate Limit:  60     Upper Rate Limit 130 PAV 180     Sensed AV Delay:  150  Tachy Zones VF:  200     Next Cardiology Appt Due:  10/20/2010 Tech Comments:  Steri strips removed, no redness or edema noted.   No parameter changes.  Device funtion normal.  ROV 10/20/10 with Dr. Elsie Saas, LPN  July 30, 2010 4:37 PM

## 2010-10-07 NOTE — Cardiovascular Report (Signed)
Summary: Pre Op Orders   Pre Op Orders   Imported By: Roderic Ovens 07/21/2010 14:31:59  _____________________________________________________________________  External Attachment:    Type:   Image     Comment:   External Document

## 2010-10-07 NOTE — Progress Notes (Signed)
Summary: Plavix from [facility]  Phone Note Outgoing Call   Call placed by: Katina Dung, RN, BSN,  October 17, 2009 11:19 AM Call placed to: Patient Summary of Call: plavix   Follow-up for Phone Call        LM with Mariana Kaufman for pt to let him know Plavix 75mg  #90 from [facility] left at front desk for him to pickup Katina Dung, RN, BSN  October 17, 2009 11:20 AM

## 2010-10-07 NOTE — Assessment & Plan Note (Signed)
Summary: 3 month rov/sl   Primary Provider:  Dr. Andrey Campanile, [facility])  CC:  3 month rov.  Pt needs reorder of Plavix and needs paperwork updated for Health services..  History of Present Illness: 55 yo presents for followup after anterior MI and development of ischemic cardiomyopathy.  Patient came to [facility] 7/10 with an acute anterior MI.  He had a totally occluded proximal LAD.  This was opened with a Xience DES. Repeat echo in 9/10 showed EF 35-40% with apical and anteroseptal hypokinesis.  He has finished cardiac rehab in [place].  He is stable symptomatically and actually is having less chest pain now.  He gets some mild left-sided chest tightness radiating to his back maybe once a week (less than before).  This tends to be brought on by carrying a heavy bag of groceries into his house and does not occur with less significant exertion.  No chest pain at rest.  He is short of breath after walking up a flight of steps or after walking 1/4 mile.  He is using a treadmill for exercise for 10 minutes every other day.    Labs (9/10): BNP 103, HDL 44, LDL 35 Labs (10/10): K 4.1, creatinine 0.75 Labs (3/11): LDL 29, HDL 44, LFTs normal Labs (1/61): K 4.7, creatinine 0.88  Current Medications (verified): 1)  Aspirin 325 Mg Tabs (Aspirin) .... Once Daily 2)  Coreg 12.5 Mg Tabs (Carvedilol) .... One Tablet Two Times A Day 3)  Plavix 75 Mg Tabs (Clopidogrel Bisulfate) .... Take One Tablet Once Daily 4)  Enalapril Maleate 5 Mg Tabs (Enalapril Maleate) .... One and One-Half Tablets Twice A Day 5)  Furosemide 40 Mg Tabs (Furosemide) .... (1/2) Every Day 6)  Glipizide 10 Mg Tabs (Glipizide) .... Take One Tablet Two Times A Day 7)  Nitroglycerin 0.4 Mg Subl (Nitroglycerin) .... As Needed 8)  Crestor 40 Mg Tabs (Rosuvastatin Calcium) .... One Daily 9)  Spironolactone 25 Mg Tabs (Spironolactone) .... Once Daily 10)  Metformin Hcl 500 Mg Tabs (Metformin Hcl) .... 2 Daily 11)  Flaxseed Oil  1200 Mg Caps (Flaxseed (Linseed)) .... Take One Capsule Once Daily 12)  Sertraline Hcl 50 Mg Tabs (Sertraline Hcl) .... Take One Tablet At Bedtime-Pt Not Taking Though Directed. 13)  Bactrim Ds 800-160 Mg Tabs (Sulfamethoxazole-Trimethoprim) .... Take One Tablet Two Times A Day  Allergies (verified): No Known Drug Allergies  Past History:  Past Medical History: 1.  CAD: Anterior STEMI 04/03/09.  3.0 x 15 mm Xience DES to totally occluded proximal LAD.  No obstructive disease elsewhere.   2.  Ischemic CMP: Echo (04/04/09) with EF 25-30%, anteroseptal, anterior, anterolateral, and apical akinesis.  Followup echo (9/10) with EF 35-40%, apical and anteroseptal hypokinesis, mild MR, grade II diastolic dysfunction.  3.  Diabetes mellitus 4.  Learning disability 5.  Hyperlipidemia: myalgias with Lipitor 6.  Depression: Seeing a psychiatrist in [place]  Family History: Reviewed history from 04/24/2009 and no changes required. Father died with a history of diabetes, but no history of coronary artery disease.  Mother had some heart problems-unspecified Brother died of an MI at age 63  Social History: Reviewed history from 08/14/2009 and no changes required. Tobacco Use - No.  Alcohol Use - no Drug Use - no Lives in [place].   Review of Systems       All systems reviewed and negative except as per HPI.   Vital Signs:  Patient profile:   55 year old male Height:  68 inches Weight:      218 pounds BMI:     33.27 Pulse rhythm:   regular BP sitting:   130 / 82  (left arm) Cuff size:   regular  Vitals Entered By: Judithe Modest CMA (February 24, 2010 8:50 AM)  Physical Exam  General:  Well developed, well nourished, in no acute distress. Neck:  Neck supple, no JVD. No masses, thyromegaly or abnormal cervical nodes. Lungs:  Clear bilaterally to auscultation and percussion. Heart:  Non-displaced PMI, chest non-tender; regular rate and rhythm, S1, S2 without murmurs, rubs or gallops.  Carotid upstroke normal, no bruit.  Pedals normal pulses. No edema, no varicosities. Abdomen:  Bowel sounds positive; abdomen soft and non-tender without masses, organomegaly, or hernias noted. No hepatosplenomegaly. Extremities:  No clubbing or cyanosis. Neurologic:  Alert and oriented x 3. Psych:  Normal affect.   Impression & Recommendations:  Problem # 1:  CORONARY ATHEROSCLEROSIS NATIVE CORONARY ARTERY (ICD-414.01) Patient has mild anginal-type pain with more heavy exertion.  This is actually improved from the past.  I will be increasing his Coreg today to raise his anginal threshold.  - Continue ASA, Plavix, ACEI, statin - Increasing Coreg to 18.75 mg two times a day - If pain pattern worsens, will need myoview.   Problem # 2:  CHRONIC SYSTOLIC HEART FAILURE (ICD-428.22) Euvolemic with NYHA class II-III symptoms.  He feels a little better since last medication increase at prior appointment.  Today, I will increase enalapril to 10 mg two times a day and Coreg to 18.75 mg two times a day.  Continue spironolactone.  BMET and BNP in 2 wks.  He will followup in 3 months with echo before followup to reassess EF (? ICD candidate).  Will not do a cardiac MRI due to lack of insurance coverage.   Problem # 3:  HYPERLIPIDEMIA-MIXED (ICD-272.4)  Lipids at goal in 3/11. Needs repeat at next appointment.   His updated medication list for this problem includes:    Crestor 40 Mg Tabs (Rosuvastatin calcium) ..... One daily  Other Orders: Echocardiogram (Echo)  Patient Instructions: 1)  Your physician has recommended you make the following change in your medication:  2)  Increase Enalapril to 10mg  twice a day 3)  Increase Coreg(carvedilol) to 18.75mg  twice a day--this will be one and one-half of a 12.5mg  tablet twice a day 4)  Your physician recommends that you have lab in 2 weeks--BMP/BNP-you have the order. 5)  Your physician has requested that you have an echocardiogram.  Echocardiography is  a painless test that uses sound waves to create images of your heart. It provides your doctor with information about the size and shape of your heart and how well your heart's chambers and valves are working.  This procedure takes approximately one hour. There are no restrictions for this procedure. IN 3 MONTHS-you have have this done the day you see Dr Shirlee Latch in 3 months 6)  Your physician recommends that you schedule a follow-up appointment in: 3 months with Dr Shirlee Latch. Prescriptions: ENALAPRIL MALEATE 10 MG TABS (ENALAPRIL MALEATE) one tablet twice a day  #60 x 11   Entered by:   Katina Dung, RN, BSN   Authorized by:   Marca Ancona, MD   Signed by:   Katina Dung, RN, BSN on 02/24/2010   Method used:   Electronically to        [facility]* (retail)       [address]  [place], [place]  [address]       Ph: [phone]       Fax: [phone]   RxID:   [id number] COREG 12.5 MG TABS (CARVEDILOL) one and one-half twice a day  #90 x 11   Entered by:   Katina Dung, RN, BSN   Authorized by:   Marca Ancona, MD   Signed by:   Katina Dung, RN, BSN on 02/24/2010   Method used:   Electronically to        [facility]* (retail)       [address]       [place], [place]  [address]       Ph: [phone]       Fax: [phone]   RxID:   [id number]

## 2010-10-07 NOTE — Letter (Signed)
Summary: Disability Determination Services  Disability Determination Services   Imported By: Kassie Mends 12/06/2009 09:27:08  _____________________________________________________________________  External Attachment:    Type:   Image     Comment:   External Document

## 2010-10-07 NOTE — Progress Notes (Signed)
Summary: question about defib  Phone Note Call from Patient Call back at Home Phone [phone]   Caller: Patient Summary of Call: Pt have questions about defib Initial call taken by: Judie Grieve,  July 24, 2010 3:54 PM  Follow-up for Phone Call        pt was concerned about bruising where icd was implanted on 07-18-10.  pt was reassured and verified device check on 07-30-10 @ 1630. pt aware. Vella Kohler  July 24, 2010 4:47 PM

## 2010-10-07 NOTE — Progress Notes (Signed)
Summary: verify medication  Phone Note From Pharmacy Call back at [phone]   Caller: [facility]* Summary of Call: Pharmacy calling to verify medication Initial call taken by: Judie Grieve,  February 27, 2010 2:21 PM  Follow-up for Phone Call        I called and spoke with Scarlette at the pharmacy. She needed to clarify both the enalapril and carvedilol instructions. The pt had told her he thought he should be taking 4 tabs total a day on the carvediolol, and he requesed an old refill on the enalapril 5 mg tabs after we sent in his new rx for the 10mg  tabs. I have updated the pharmacy on the what the pt should currently be taking for both. Follow-up by: Sherri Rad, RN, BSN,  February 27, 2010 3:30 PM

## 2010-10-07 NOTE — Progress Notes (Signed)
Summary: lab work  Phone Note Call from Patient Call back at [facility] [phone]   Caller: Patient Reason for Call: Talk to Nurse Summary of Call: request to speak to nurse about blood work Initial call taken by: Migdalia Dk,  March 13, 2010 10:29 AM  Follow-up for Phone Call        talked with pt by telephone--pt will come to [facility] for BMP/BNP tomorrow

## 2010-10-07 NOTE — Progress Notes (Signed)
Summary: question about ICD Implatation  Phone Note Call from Patient Call back at Home Phone [phone]   Caller: Patient Summary of Call: Pt question about a ICD implatation Initial call taken by: Judie Grieve,  June 30, 2010 3:48 PM  Follow-up for Phone Call        07/11/10 labs pre procedure at 9:00 Dennis Bast, RN, BSN  June 30, 2010 5:29 PM ICD impalnt on 07/18/10 Dennis Bast, RN, BSN  June 30, 2010 5:30 PM

## 2010-10-07 NOTE — Letter (Signed)
Summary: order for BMP/BNP in 2 weeks  [facility], Main Office  [address]   [place], [place] [address]   Phone: [phone]  Fax: [phone]        February 24, 2010 MRN: [id number]    Zachary Berry [address] [place], [place]  [address] DOB [birth year]   BMP and BNP in 2 weeks---414.01 428.22 v58.69 please fax results to [phone] Dr Marca Ancona        Sincerely,     [name]  This letter has been electronically signed by your physician.

## 2010-10-07 NOTE — Medication Information (Signed)
Summary: [employer] Prescription Refill  [employer] Prescription Refill   Imported By: Roderic Ovens 01/07/2010 11:50:37  _____________________________________________________________________  External Attachment:    Type:   Image     Comment:   External Document

## 2010-10-07 NOTE — Progress Notes (Signed)
Summary: Pt needs his Plavix ordered  Phone Note Call from Patient Call back at Home Phone [phone]   Caller: Patient Summary of Call: Pt needs his Plavix ordered. Initial call taken by: Judie Grieve,  October 10, 2009 1:59 PM  Follow-up for Phone Call       Follow-up by: Judithe Modest CMA,  October 11, 2009 9:04 AM    Prescriptions: PLAVIX 75 MG TABS (CLOPIDOGREL BISULFATE) take one tablet once daily  #30 x 6   Entered by:   Judithe Modest CMA   Authorized by:   Marca Ancona, MD   Signed by:   Judithe Modest CMA on 10/11/2009   Method used:   Electronically to        [facility]* (retail)       [address]       [place], [place]  [address]       Ph: [phone]       Fax: [phone]   RxID:   [id number]   Appended Document: Pt needs his Plavix ordered Plavix ordered from [facility].  Medication will arrive in 7-10 business days.

## 2010-10-07 NOTE — Progress Notes (Signed)
Summary: reorder Plavix from [facility]  Phone Note Outgoing Call   Call placed by: Katina Dung, RN, BSN,  December 16, 2009 5:14 PM Call placed to: Patient Summary of Call: reorder Plavix  Follow-up for Phone Call        pt requesting reorder Plavix from [facility]--I reordered Plavix today from [facility] [phone] Susie--will be here  in 7-10 days     Appended Document: reorder Plavix from [facility] Plavix 75mg  90 days received from [facility]--pt aware med here and ready to be picked up

## 2010-10-07 NOTE — Letter (Signed)
Summary: [facility] - Walk-In Patient Form  [facility] - Walk-In Patient Form   Imported By: Marylou Mccoy 02/12/2010 13:53:08  _____________________________________________________________________  External Attachment:    Type:   Image     Comment:   External Document

## 2010-10-07 NOTE — Progress Notes (Signed)
  Pt Signed ROI.Marland KitchenMarland KitchenSent to [place] for Completion [facility]  November 25, 2009 10:21 AM

## 2010-10-07 NOTE — Letter (Signed)
Summary: Appointment - Cardiac MRI  [facility], Main Office  [address]   [place], [place] [address]   Phone: [phone]  Fax: [phone]      June 11, 2010 MRN: [id number]   Zachary Berry [address] [place], [place]  [address]   Dear Mr. Doorn,   We have scheduled the above patient for an appointment for a Cardiac MRI on 06-23-2010 at 9:00 a.m.  Please refer to the below information for the location and instructions for this test:  Location:     [facility]       [address]       [place], [place]  [address] Instructions:    Wilmon Arms at [facility] Outpatient Registration 45 minutes prior to your appointment time.  This will ensure you are in the Radiology Department 30 minutes prior to your appointment.    There are no restrictions for this test you may eat and take medications as usual.  If you need to reschedule this appointment please call at the number listed above.  Sincerely,      Lorne Skeens  [facility] Scheduling Team

## 2010-10-07 NOTE — Progress Notes (Signed)
  Recieved Request for Records from DDS forwarded to [facility]. Cala Bradford Mesiemore  September 20, 2009 1:40 PM

## 2010-10-07 NOTE — Progress Notes (Signed)
Summary: RX REFILL  Phone Note Refill Request   Refills Requested: Medication #1:  PLAVIX 75 MG TABS take one tablet once daily [facility] DRUG# [phone]   Method Requested: Telephone to Pharmacy Initial call taken by: Roe Coombs,  July 22, 2010 11:41 AM  Follow-up for Phone Call       Follow-up by: Marrion Coy, CNA,  July 22, 2010 12:32 PM    Prescriptions: PLAVIX 75 MG TABS (CLOPIDOGREL BISULFATE) take one tablet once daily  #30 x 6   Entered by:   Marrion Coy, CNA   Authorized by:   Marca Ancona, MD   Signed by:   Marrion Coy, CNA on 07/22/2010   Method used:   Electronically to        [facility]* (retail)       [address]       [place], [place]  [address]       Ph: [phone]       Fax: [phone]   RxID:   [id number]

## 2010-10-07 NOTE — Medication Information (Signed)
Summary: Tax adviser   Imported By: Kassie Mends 09/23/2009 08:22:22  _____________________________________________________________________  External Attachment:    Type:   Image     Comment:   External Document

## 2010-10-07 NOTE — Assessment & Plan Note (Signed)
Summary: PER CHECK OUT/SF   Primary Provider:  Dr. Andrey Campanile, [facility]  CC:  follow up/ pt states he is feeling tired and needs something for depression.  He finds that sometimes he still has some chest discomfort.Marland Kitchen  History of Present Illness: 55 yo presents for followup after anterior MI and development of ischemic cardiomyopathy.  Patient came to [facility] 7/10 with an acute anterior MI.  He had a totally occluded proximal LAD.  This was opened with a Xience DES. Repeat echo in 9/10 showed EF 35-40% with apical and anteroseptal hypokinesis.  He has finished cardiac rehab in [place].  He is walking for 25-30 minutes a day.  Mild shortness of breath by the end of his walk and if he carries a heavy load (walking up front steps with groceries).  He has had some on and off pain in his left upper back and left lateral chest, not related to exertion. Gets this pain daily. It is mild.  He had myalgias with Lipitor and has switched to Crestor.  Finally, patient reports feeling depressed most of the time.  He is going to see a psychiatrist in the next week.   Labs (9/10): BNP 103, HDL 44, LDL 35 Labs (10/10): K 4.1, creatinine 0.75 Labs (3/11): LDL 29, HDL 44, LFTs normal  Current Medications (verified): 1)  Aspirin 325 Mg Tabs (Aspirin) .... Once Daily 2)  Coreg 12.5 Mg Tabs (Carvedilol) .... One Tablet Two Times A Day 3)  Plavix 75 Mg Tabs (Clopidogrel Bisulfate) .... Take One Tablet Once Daily 4)  Enalapril Maleate 5 Mg Tabs (Enalapril Maleate) .... Take One Tablet Two Times A Day 5)  Furosemide 40 Mg Tabs (Furosemide) .... (1/2) Every Day 6)  Glipizide 10 Mg Tabs (Glipizide) .... Take One Tablet Two Times A Day 7)  Nitroglycerin 0.4 Mg Subl (Nitroglycerin) .... As Needed 8)  Crestor 40 Mg Tabs (Rosuvastatin Calcium) .... One Daily 9)  Spironolactone 25 Mg Tabs (Spironolactone) .... Once Daily 10)  Metformin Hcl 500 Mg Tabs (Metformin Hcl) .... 2 Daily  Allergies (verified): No Known Drug  Allergies  Past History:  Past Medical History: 1.  CAD: Anterior STEMI 04/03/09.  3.0 x 15 mm Xience DES to totally occluded proximal LAD.  No obstructive disease elsewhere.   2.  Ischemic CMP: Echo (04/04/09) with EF 25-30%, anteroseptal, anterior, anterolateral, and apical akinesis.  Followup echo (9/10) with EF 35-40%, apical and anteroseptal hypokinesis, mild MR, grade II diastolic dysfunction.  3.  Diabetes mellitus 4.  Learning disability 5.  Hyperlipidemia: myalgias with Lipitor  Family History: Reviewed history from 04/24/2009 and no changes required. Father died with a history of diabetes, but no history of coronary artery disease.  Mother had some heart problems-unspecified Brother died of an MI at age 3  Social History: Reviewed history from 08/14/2009 and no changes required. Tobacco Use - No.  Alcohol Use - no Drug Use - no Lives in [place].   Review of Systems       All systems reviewed and negative except as per HPI.   Vital Signs:  Patient profile:   55 year old male Height:      68 inches Weight:      219 pounds BMI:     33.42 O2 Sat:      97 % on Room air Pulse rate:   62 / minute Pulse rhythm:   regular BP sitting:   114 / 76  (left arm) Cuff size:   large  Vitals Entered By: Judithe Modest CMA (November 25, 2009 9:03 AM)  O2 Flow:  Room air  Physical Exam  General:  Well developed, well nourished, in no acute distress. Neck:  Neck supple, no JVD. No masses, thyromegaly or abnormal cervical nodes. Lungs:  Clear bilaterally to auscultation and percussion. Heart:  Non-displaced PMI, chest non-tender; regular rate and rhythm, S1, S2 without murmurs, rubs or gallops. Carotid upstroke normal, no bruit.  Pedals normal pulses. No edema, no varicosities. Abdomen:  Bowel sounds positive; abdomen soft and non-tender without masses, organomegaly, or hernias noted. No hepatosplenomegaly. Extremities:  No clubbing or cyanosis. Neurologic:  Alert and oriented x  3. Psych:  Normal affect.   Impression & Recommendations:  Problem # 1:  CORONARY ATHEROSCLEROSIS NATIVE CORONARY ARTERY (ICD-414.01) Has some atypical back and chest pain, never exertional.  Continue ASA, Plavix, statin, beta blocker, ACEI.    Problem # 2:  CHRONIC SYSTOLIC HEART FAILURE (ICD-428.22) Euvolemic on exam.  NYHA class II symptoms.  Increase enalapril to 7.5 mg bid with BMET in 2 wks.  Continue current doses of spironolactone and Coreg.  When he gets his Medicaid coverage, I would like him to have a cardiac MRI to quantitate cardiac function as he is on the borderline for ICD (EF 35-40% on last echo).   Problem # 3:  HYPERLIPIDEMIA-MIXED (ICD-272.4) Tolerating Crestor without myalgias. Excellent lipids.   Other Orders: TLB-Lipid Panel (80061-LIPID) TLB-Hepatic/Liver Function Pnl (80076-HEPATIC)  Patient Instructions: 1)  Your physician has recommended you make the following change in your medication:  2)  Increase Enalapril to 7.5mg  twice a day--this will be one and one-half 5mg  tablets twice a day 3)  Your physician recommends that you have  a FASTING lipid profile/liver profiel today 428.22 414.01  4)  Lab in 2 weeks--BMP -you have the order --please ask them to fax the results to [phone]  5)  Your physician recommends that you schedule a follow-up appointment in: 3 months with Dr Marca Ancona Prescriptions: ENALAPRIL MALEATE 5 MG TABS (ENALAPRIL MALEATE) one and one-half tablets twice a day  #90 x 6   Entered by:   Katina Dung, RN, BSN   Authorized by:   Marca Ancona, MD   Signed by:   Katina Dung, RN, BSN on 11/25/2009   Method used:   Electronically to        [facility]* (retail)       [address]       [place], [place]  [address]       Ph: [phone]       Fax: [phone]   RxID:   [id number]

## 2010-10-07 NOTE — Progress Notes (Signed)
Summary: Disability was denied  Phone Note Call from Patient Call back at Home Phone [phone]   Caller: Patient Summary of Call: Pt disability was denied  only wants to talk to  [facility] Initial call taken by: Judie Grieve,  November 15, 2009 10:16 AM  Follow-up for Phone Call        pt wife calling, told her that her husband had already called Migdalia Dk  November 19, 2009 4:41 PM  LMVM for pt Katina Dung, RN, BSN  November 19, 2009 7:23 PM  talked with patient--disability denied--pt has appealed disablity--waiting to get date for appeal to be heard

## 2010-10-07 NOTE — Miscellaneous (Signed)
Summary: Device preload  Clinical Lists Changes  Observations: Added new observation of ICD INDICATN: ICM (07/23/2010 11:53) Added new observation of ICDLEADSTAT2: active (07/23/2010 11:53) Added new observation of ICDLEADSER2: [id number] (07/23/2010 11:53) Added new observation of ICDLEADMOD2: 4098  (07/23/2010 11:53) Added new observation of ICDLEADLOC2: RV  (07/23/2010 11:53) Added new observation of ICDLEADSTAT1: active  (07/23/2010 11:53) Added new observation of ICDLEADSER1: [id number]  (07/23/2010 11:53) Added new observation of ICDLEADMOD1: 5076  (07/23/2010 11:53) Added new observation of ICDLEADLOC1: RA  (07/23/2010 11:53) Added new observation of ICD IMP MD: Hillis Range, MD  (07/23/2010 11:53) Added new observation of ICDLEADDOI2: 07/18/2010  (07/23/2010 11:53) Added new observation of ICDLEADDOI1: 07/18/2010  (07/23/2010 11:53) Added new observation of ICD IMPL DTE: 07/18/2010  (07/23/2010 11:53) Added new observation of ICD SERL#: [id number] H  (07/23/2010 11:53) Added new observation of ICD MODL#: [id number]  (07/23/2010 11:53) Added new observation of ICDMANUFACTR: [employer]  (07/23/2010 11:53) Added new observation of ICD MD: Hillis Range, MD  (07/23/2010 11:53)       ICD Specifications Following MD:  Hillis Range, MD     ICD Vendor:  [employer]     ICD Model Number:  [id number]     ICD Serial Number:  [id number] ICD DOI:  07/18/2010     ICD Implanting MD:  Hillis Range, MD  Lead 1:    Location: RA     DOI: 07/18/2010     Model #: [id number]     Serial #: [id number]     Status: active Lead 2:    Location: RV     DOI: 07/18/2010     Model #: 3664     Serial #: [id number]     Status: active  Indications::  ICM

## 2010-10-07 NOTE — Progress Notes (Signed)
Summary: TEST RESULTS  Phone Note Call from Patient Call back at Home Phone [phone]   Caller: Patient Reason for Call: Lab or Test Results Summary of Call: PT RETURNING [facility] RE TEST RESULTS. Initial call taken by: Roe Coombs,  June 23, 2010 11:14 AM  Follow-up for Phone Call        pt given lab results, he states he drinks a lot of V8 tom. juice and he will cut back Meredith Staggers, RN  June 23, 2010 11:21 AM

## 2010-10-07 NOTE — Progress Notes (Signed)
  Recieved Request form DDS forwarded to [facility]  December 11, 2009 2:34 PM    Appended Document:  Recieved 2nd request from DDS forwarded to [facility]

## 2010-10-07 NOTE — Progress Notes (Signed)
Summary: pt received his medicare/ medicade  Phone Note Call from Patient Call back at [facility] Phone [phone]   Caller: Patient Reason for Call: Talk to Nurse Summary of Call: pt was told to call when he receive his  medicare/medicade.  Initial call taken by: Lorne Skeens,  May 05, 2010 10:19 AM  Follow-up for Phone Call        left message for pt that he should get a call from billing to get insurance information into our system

## 2010-10-07 NOTE — Letter (Signed)
Summary: Implantable Device Instructions  Architectural technologist, Main Office  [address]   [place], [place] [address]   Phone: [phone]  Fax: [phone]      Implantable Device Instructions  You are scheduled for:  _____ Implantable Cardioverter Defibrillator   on 07/08/10 with Dr. Johney Frame.  1.  Please arrive at the Short Stay Center at [facility] at 12:30pm on the day of your procedure.  2.  Do not eat or drink after midnight  the night before your procedure.  3.  Complete lab work on 07/11/10 at 9:00am. .  You do not have to be fasting.  4.  Do NOT take these medications for the morning of procedure:  Furosemide, Glipizide,and Metformin.    5.  Plan for an overnight stay.  Bring your insurance cards and a list of your medications.  6.  Wash your chest and neck with antibacterial soap (any brand) the evening before and the morning of your procedure.  Rinse well.  7.  Education material received:               ICD _____            *If you have ANY questions after you get home, please call the office [phone]. Anselm Pancoast  *Every attempt is made to prevent procedures from being rescheduled.  Due to the nauture of Electrophysiology, rescheduling can happen.  The physician is always aware and directs the staff when this occurs.    Appended Document: Implantable Device Instructions Check in at 10:30am for a 12:30 procedure  pt aware of time  wrong time on instruction sheet

## 2010-10-07 NOTE — Progress Notes (Signed)
Summary: referral to pulmonary  Phone Note Outgoing Call   Call placed by: Katina Dung, RN, BSN,  July 14, 2010 2:30 PM Call placed to: Patient Summary of Call: sleep study results  Follow-up for Phone Call        Dr Shirlee Latch reviewed sleep study done 07/01/10--he recommended referral to pulmonary for CPAP--I discussed with pt and he verbalized understanding  New Problems: SLEEP APNEA (ICD-780.57)   New Problems: SLEEP APNEA (ICD-780.57)

## 2010-10-07 NOTE — Progress Notes (Signed)
Summary: assitance for plavix/pt rtn your call  Phone Note Call from Patient Call back at Home Phone [phone]   Caller: Patient  Reason for Call: Talk to Nurse Summary of Call: per pt calling, pt out of plavix. pt get assistance through the drug company.  Initial call taken by: Lorne Skeens,  March 31, 2010 12:57 PM  Follow-up for Phone Call        according to [facility] the pt application is expired. pt aware he will need to bring proof of income so we can resend application Deliah Goody, RN  March 31, 2010 2:39 PM--LMTCB Katina Dung, RN, BSN  April 01, 2010 11:54 AM   pt rtn you call you can reach him at the same number Omer Jack  April 01, 2010 12:06 PM  talked with pt by telephone--he requested that I send him the application for Plavix completed by Dr Lanier Clam he would send the application with his proof of income into [place]-he states he has 20 or so Plavix --he verbalizes the importance of not running out of Plavix Katina Dung, RN, BSN  April 01, 2010 1:49 PM  completed application(except for pt portion and income documentation)  mailed to pt Katina Dung, RN, BSN  April 01, 2010 6:49 PM

## 2010-10-07 NOTE — Progress Notes (Signed)
  Walk in Patient Form Recieved " Results Are from Diabetes Mgnt" forwarded to [facility]  November 25, 2009 9:23 AM

## 2010-10-07 NOTE — Progress Notes (Signed)
Summary: PT ONLY HAS MEDCAID  Phone Note Call from Patient Call back at Home Phone [phone]   Caller: Patient Summary of Call: PT RETURNING CALL PT ONLY HAVE MEDCAID Initial call taken by: Judie Grieve,  May 05, 2010 4:58 PM  Follow-up for Phone Call        I talked with Steward Drone in pro fee billing--she will call pt and get info from him

## 2010-10-09 NOTE — Assessment & Plan Note (Signed)
Summary: per check out   Visit Type:  Follow-up Referring Provider:  Marca Ancona MD Primary Provider:  Dr. Katrinka Blazing, [facility])  CC:  some shortness of breath.  History of Present Illness: 55 yo with history of CAD and ischemic CMP returns for followup after recent ICD placement.  Lately, patient has been stable symptomatically. No significant chest pain.  He gets short of breath after walking about 100 yards.  He is walking some on a treadmill for exercise.  He is using CPAP and thinks that it helps.  His depression seems to have improved somewhat with sertraline.    Optivol was checked, showing that thoracic impedance was trending down but was still well below threshold.   Las (10/11): LDL 32, HDL 42 Labs (11/11): K 4.4, creatinine 0.8  Current Medications (verified): 1)  Aspirin 325 Mg Tabs (Aspirin) .... Once Daily 2)  Coreg 12.5 Mg Tabs (Carvedilol) .... One and One-Half Twice A Day 3)  Plavix 75 Mg Tabs (Clopidogrel Bisulfate) .... Take One Tablet Once Daily 4)  Enalapril Maleate 10 Mg Tabs (Enalapril Maleate) .... One and One- Half  Tablet Twice A Day 5)  Furosemide 40 Mg Tabs (Furosemide) .... (1/2) Every Day 6)  Glipizide 10 Mg Tabs (Glipizide) .... Take One Tablet Two Times A Day 7)  Nitroglycerin 0.4 Mg Subl (Nitroglycerin) .... As Needed 8)  Crestor 40 Mg Tabs (Rosuvastatin Calcium) .... One Daily 9)  Spironolactone 25 Mg Tabs (Spironolactone) .... Once Daily 10)  Metformin Hcl1000 Mg Tabs (Metformin Hcl) .... One By Mouth Two Times A Day 11)  Flaxseed Oil 1200 Mg Caps (Flaxseed (Linseed)) .... Take One Capsule Once Daily 12)  Sertraline Hcl 100 Mg Tabs (Sertraline Hcl) .... 2 Once Daily  Allergies (verified): No Known Drug Allergies  Past History:  Past Medical History: 1.  CAD: Anterior STEMI 04/03/09.  3.0 x 15 mm Xience DES to totally occluded proximal LAD.  No obstructive disease elsewhere.   2.  Ischemic CMP: Echo (04/04/09) with EF 25-30%,  anteroseptal, anterior, anterolateral, and apical akinesis.  Followup echo (9/10) with EF 35-40%, apical and anteroseptal hypokinesis, mild MR, grade II diastolic dysfunction.  Cardiac MRI (10/11): EF 34%, apical anterior, apical septal, and true apex full thickness scar, basal to mid anterior wall with 2/3 thickness scar.  Medtronic dual chamber ICD with Optivol was placed 11/11.  3.  Diabetes mellitus 4.  Learning disability 5.  Hyperlipidemia: myalgias with Lipitor 6.  Depression: Seeing a psychiatrist in [place] 7.  Suspect OSA (awaiting sleep study) 8.  Very poor vision Myocardial Infarction  Family History: Reviewed history from 08/13/2010 and no changes required. Father died with a history of diabetes,  but no history of coronary artery disease.   Mother had some heart problems-unspecified Brother died of an MI at age 45.   Another brother had CABG at age 65 stomach cancer--uncle  Social History: Reviewed history from 08/13/2010 and no changes required. Tobacco Use - No.  Alcohol Use - no Drug Use - no Lives in [place].   Disabled due  CAD.  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  Review of Systems       All systems reviewed and negative except as per HPI.   Vital Signs:  Patient profile:   55 year old male Height:      68 inches Weight:      229 pounds BMI:     34.95 Pulse rate:  65 / minute BP sitting:   108 / 54  (left arm) Cuff size:   large  Vitals Entered By: Micki Riley CNA (September 17, 2010 9:21 AM)  Physical Exam  General:  obese male in nad Mouth:  post nasal drip.   Neck:  Neck thick, no JVD. No masses, thyromegaly or abnormal cervical nodes. Lungs:  Clear bilaterally to auscultation and percussion. Heart:  Non-displaced PMI, chest non-tender; regular rate and rhythm, S1, S2 without murmurs, rubs or gallops. Carotid upstroke normal, no bruit. Pedals normal pulses. No edema, no  varicosities. Abdomen:  Bowel sounds positive; abdomen soft and non-tender without masses, organomegaly, or hernias noted. No hepatosplenomegaly. Extremities:  No clubbing or cyanosis. Neurologic:  Alert and oriented x 3. Psych:  Normal affect.    ICD Specifications Following MD:  Hillis Range, MD     ICD Vendor:  Medtronic     ICD Model Number:  [id number]     ICD Serial Number:  [id number] ICD DOI:  07/18/2010     ICD Implanting MD:  Hillis Range, MD  Lead 1:    Location: RA     DOI: 07/18/2010     Model #: [id number]     Serial #: [id number]     Status: active Lead 2:    Location: RV     DOI: 07/18/2010     Model #: [id number]     Serial #: [id number]     Status: active  Indications::  ICM   ICD Follow Up ICD Dependent:  No      Episodes Coumadin:  No  Brady Parameters Mode DDD+     Lower Rate Limit:  60     Upper Rate Limit 130 PAV 180     Sensed AV Delay:  150  Tachy Zones VF:  200     Impression & Recommendations:  Problem # 1:  CORONARY ATHEROSCLEROSIS NATIVE CORONARY ARTERY (ICD-414.01) No ischemic symptoms.  S/p anterior MI.  Continue ASA, Plavix, beta blocker, ACEI, and statin.  Ok to decrease ASA dose to 81 mg daily.   Problem # 2:  CHRONIC SYSTOLIC HEART FAILURE (ICD-428.22) NYHA class II-III symptoms.  Optivol shows thoracic impedance is decreasing but is still well below the threshold.  Does not appear volume overloaded on exam.  Continue current dose of Lasix but needs to be very careful to limit salt intake.  Continue current Coreg, enalapril, and spironolactone.  BMET/BNP today.   Problem # 3:  OBSTRUCTIVE SLEEP APNEA (ICD-327.23) Continue CPAP.   Problem # 4:  HYPERLIPIDEMIA-MIXED (ICD-272.4) Goal LDL < 70.   Other Orders: EKG w/ Interpretation (93000) TLB-BMP (Basic Metabolic Panel-BMET) (80048-METABOL) TLB-BNP (B-Natriuretic Peptide) (83880-BNPR)  Patient Instructions: 1)  Labwork today: bmet/bnp (414.01;428.22). 2)  Decrease Aspirin to 81mg  once daily. 3)   Your physician wants you to follow-up in: 4 months.  You will receive a reminder letter in the mail two months in advance. If you don't receive a letter, please call our office to schedule the follow-up appointment.

## 2010-10-09 NOTE — Progress Notes (Signed)
Summary: rx refill  Phone Note Refill Request Message from:  Patient on August 28, 2010 1:46 PM  Refills Requested: Medication #1:  CRESTOR 40 MG TABS one daily  Method Requested: Telephone to Pharmacy Initial call taken by: Roe Coombs,  August 28, 2010 1:46 PM Caller: Patient    Prescriptions: CRESTOR 40 MG TABS (ROSUVASTATIN CALCIUM) one daily  #30 x 5   Entered by:   Laurance Flatten CMA   Authorized by:   Hillis Range, MD   Signed by:   Laurance Flatten CMA on 08/28/2010   Method used:   Electronically to        [facility] (retail)       [address]       [place], [place]  [address]       Ph: [phone]       Fax: [phone]   RxID:   [id number]

## 2010-10-09 NOTE — Procedures (Signed)
Summary: Cardiology Device Clinic   Current Medications (verified): 1)  Aspirin 325 Mg Tabs (Aspirin) .... Once Daily 2)  Coreg 12.5 Mg Tabs (Carvedilol) .... One and One-Half Twice A Day 3)  Plavix 75 Mg Tabs (Clopidogrel Bisulfate) .... Take One Tablet Once Daily 4)  Enalapril Maleate 10 Mg Tabs (Enalapril Maleate) .... One and One- Half  Tablet Twice A Day 5)  Furosemide 40 Mg Tabs (Furosemide) .... (1/2) Every Day 6)  Glipizide 10 Mg Tabs (Glipizide) .... Take One Tablet Two Times A Day 7)  Nitroglycerin 0.4 Mg Subl (Nitroglycerin) .... As Needed 8)  Crestor 40 Mg Tabs (Rosuvastatin Calcium) .... One Daily 9)  Spironolactone 25 Mg Tabs (Spironolactone) .... Once Daily 10)  Metformin Hcl1000 Mg Tabs (Metformin Hcl) .... One By Mouth Two Times A Day 11)  Flaxseed Oil 1200 Mg Caps (Flaxseed (Linseed)) .... Take One Capsule Once Daily 12)  Sertraline Hcl 100 Mg Tabs (Sertraline Hcl) .... 2 Once Daily  Allergies (verified): No Known Drug Allergies   ICD Specifications Following MD:  Hillis Range, MD     ICD Vendor:  Medtronic     ICD Model Number:  [id number]     ICD Serial Number:  [id number] ICD DOI:  07/18/2010     ICD Implanting MD:  Hillis Range, MD  Lead 1:    Location: RA     DOI: 07/18/2010     Model #: [id number]     Serial #: [id number]     Status: active Lead 2:    Location: RV     DOI: 07/18/2010     Model #: [id number]     Serial #: [id number]     Status: active  Indications::  ICM   ICD Follow Up Battery Voltage:  3.22 V     Charge Time:  8.4 seconds     Underlying rhythm:  SR ICD Dependent:  No       ICD Device Measurements Atrium:  Impedance: 532 ohms,  Right Ventricle:  Impedance: 570 ohms,  Shock Impedance: 55/67 ohms   Episodes MS Episodes:  0     Coumadin:  No Shock:  0     ATP:  0     Nonsustained:  0     Atrial Therapies:  0 Atrial Pacing:  29.4%     Ventricular Pacing:  <0.1%  Brady Parameters Mode MVP     Lower Rate Limit:  60     Upper Rate Limit 130 PAV 180      Sensed AV Delay:  150  Tachy Zones VF:  200     Next Cardiology Appt Due:  10/20/2010 Tech Comments:  INTERROGATION ONLY---PT SEEING DR MCLEAN.  NO EPISODES SINCE LAST CHECK.  OPTIVOL ELEVATED A LITTLE.  PT HAVING SOME SOB. DEMONSTRATED TONE FOR PT AND PT AWARE TO CALL OFFICE IF HEARD.   ROV 10-20-10 W/JA. Vella Kohler  September 17, 2010 9:59 AM

## 2010-10-09 NOTE — Assessment & Plan Note (Signed)
Summary: consult for moderate osa   Visit Type:  Initial Consult Copy to:  Marca Ancona MD Primary Raegyn Renda/Referring Emry Barbato:  Dr. Katrinka Blazing, [facility])  CC:  sleep consult. pt states he wakes up multiple times at night and snores. .  History of Present Illness: The pt is a 55y/o male who I have been asked to see for management of osa.  He recently had an npsg which showed moderate osa with AHI 21/hr and desat to as low as 84%.  He was titrated on cpap as part of a split night study, and found to have optimal pressure of 12cm with medium quattro ffm.  The pt has been noted to have loud snoring, as well as an abnormal breathing pattern during sleep.  He goes to bed at 9-75mn, and arises at 6am to start his day.  He has frequent awakenings, and is not rested in the am's upon arising.  He does get sleepy during the day with periods of inactivity such as watching tv, and does take naps during the day.  He denies any issues with sleepiness while driving.  His weight is up about 15 pounds over the last 2 years.   Current Medications (verified): 1)  Aspirin 325 Mg Tabs (Aspirin) .... Once Daily 2)  Coreg 12.5 Mg Tabs (Carvedilol) .... One and One-Half Twice A Day 3)  Plavix 75 Mg Tabs (Clopidogrel Bisulfate) .... Take One Tablet Once Daily 4)  Enalapril Maleate 10 Mg Tabs (Enalapril Maleate) .... One and One- Half  Tablet Twice A Day 5)  Furosemide 40 Mg Tabs (Furosemide) .... (1/2) Every Day 6)  Glipizide 10 Mg Tabs (Glipizide) .... Take One Tablet Two Times A Day 7)  Nitroglycerin 0.4 Mg Subl (Nitroglycerin) .... As Needed 8)  Crestor 40 Mg Tabs (Rosuvastatin Calcium) .... One Daily 9)  Spironolactone 25 Mg Tabs (Spironolactone) .... Once Daily 10)  Metformin Hcl1000 Mg Tabs (Metformin Hcl) .... One By Mouth Two Times A Day 11)  Flaxseed Oil 1200 Mg Caps (Flaxseed (Linseed)) .... Take One Capsule Once Daily 12)  Sertraline Hcl 100 Mg Tabs (Sertraline Hcl) .... 2 Once Daily  Allergies  (verified): No Known Drug Allergies  Past History:  Past Medical History: 1.  CAD: Anterior STEMI 04/03/09.  3.0 x 15 mm Xience DES to totally occluded proximal LAD.  No obstructive disease elsewhere.   2.  Ischemic CMP: Echo (04/04/09) with EF 25-30%, anteroseptal, anterior, anterolateral, and apical akinesis.  Followup echo (9/10) with EF 35-40%, apical and anteroseptal hypokinesis, mild MR, grade II diastolic dysfunction.  3.  Diabetes mellitus 4.  Learning disability 5.  Hyperlipidemia: myalgias with Lipitor 6.  Depression: Seeing a psychiatrist in [place] 7.  Suspect OSA (awaiting sleep study) 8.  Very poor vision Myocardial Infarction  Past Surgical History: PCI LAD 7/10 Hand Surgery stent placement defibillater  Family History: Reviewed history from 06/27/2010 and no changes required. Father died with a history of diabetes,  but no history of coronary artery disease.   Mother had some heart problems-unspecified Brother died of an MI at age 56.   Another brother had CABG at age 49 stomach cancer--uncle  Social History: Reviewed history from 06/27/2010 and no changes required. Tobacco Use - No.  Alcohol Use - no Drug Use - no Lives in [place].   Disabled due  CAD.  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  Review of Systems       The patient  complains of shortness of breath with activity, shortness of breath at rest, chest pain, irregular heartbeats, weight change, headaches, anxiety, depression, and joint stiffness or pain.  The patient denies productive cough, non-productive cough, coughing up blood, acid heartburn, indigestion, loss of appetite, abdominal pain, difficulty swallowing, sore throat, tooth/dental problems, nasal congestion/difficulty breathing through nose, sneezing, itching, ear ache, hand/feet swelling, rash, change in color of mucus, and fever.    Vital Signs:  Patient profile:   55 year old  male Height:      68 inches Weight:      232 pounds O2 Sat:      97 % on Room air Temp:     98.1 degrees F oral Pulse rate:   67 / minute BP sitting:   116 / 70  (left arm) Cuff size:   large  Vitals Entered By: Carver Fila (August 13, 2010 8:57 AM)  O2 Flow:  Room air CC: sleep consult. pt states he wakes up multiple times at night, snores.  Comments meds and allergies updated Phone number updated Carver Fila  August 13, 2010 8:55 AM    Physical Exam  General:  obese male in nad Eyes:  PERRLA and EOMI.   Nose:  patent without discharge Mouth:  no significant abnl of palate or uvula no exudates Neck:  large neck, no jvd, LN, TMG Lungs:  clear to auscultation Heart:  rrr, no mrg Abdomen:  soft and nontender, bs+ Extremities:  no significant edema, no cyanosis pulses intact distally Neurologic:  alert and oriented, moves all 4.   Impression & Recommendations:  Problem # 1:  OBSTRUCTIVE SLEEP APNEA (ICD-327.23) the pt has been found to have moderate osa by his sleep study, and is clearly symptomatic with disrupted sleep and EDS.  He also has underlying CV disease that may be adversely affected by his SDB.  I have had a long discussion with the pt about sleep apnea, including its impact on QOL and CV health.  Treatment options for this degree of osa can include surgery and dental appliance, but  I think cpap while working on weight loss gives him the best chance of success.  The pt is agreeable to trying cpap.  Medications Added to Medication List This Visit: 1)  Sertraline Hcl 100 Mg Tabs (Sertraline hcl) .... 2 once daily  Other Orders: Consultation Level IV (04540) DME Referral (DME)  Patient Instructions: 1)  will start on cpap...please call if issues with tolerance 2)  work on weight loss 3)  followup with me in 5 weeks.

## 2010-10-09 NOTE — Progress Notes (Signed)
Summary: Records Request  Faxed Phone Note (with clearance for Plavix) to Dr. Theora Gianotti Office ([phone]) per Zachary Berry  September 25, 2010 11:38 AM

## 2010-10-09 NOTE — Progress Notes (Signed)
Summary: Need surgical clearance for dental work  Phone Note Call from Patient Call back at [employer] [phone]   Caller: Patient Reason for Call: Talk to Nurse Summary of Call: need a call regarding a surgical clearance, cutting teeth out from gum pt is having to be put to sleep, call Dr.Brown at [phone] Initial call taken by: Judie Grieve,  September 25, 2010 8:21 AM     Appended Document: Need surgical clearance for dental work He is stable post-MI in 2010.  He can hold Plavix 5 days before procedure if needed.  No further cardiac testing needed.   Appended Document: Need surgical clearance for dental work I discussed with pt--pt states dental procedure is to be done in Dr Theora Gianotti office

## 2010-10-09 NOTE — Cardiovascular Report (Signed)
Summary: Office Visit   Office Visit   Imported By: Roderic Ovens 09/29/2010 11:02:09  _____________________________________________________________________  External Attachment:    Type:   Image     Comment:   External Document

## 2010-10-09 NOTE — Cardiovascular Report (Signed)
Summary: Office Visit   Office Visit   Imported By: Roderic Ovens 08/18/2010 13:19:37  _____________________________________________________________________  External Attachment:    Type:   Image     Comment:   External Document

## 2010-10-09 NOTE — Letter (Signed)
Summary: CMN/[facility]  CMN/[facility]   Imported By: Lester Ashley 09/19/2010 08:13:58  _____________________________________________________________________  External Attachment:    Type:   Image     Comment:   External Document

## 2010-10-15 NOTE — Progress Notes (Signed)
  Phone Note Other Incoming   Request: Send information Summary of Call: Patient completed a [facility] medical release for his Zachary Berry to receive copies of his records. Request forwarded to [facility].

## 2010-10-20 ENCOUNTER — Encounter (INDEPENDENT_AMBULATORY_CARE_PROVIDER_SITE_OTHER): Payer: Medicaid Other | Admitting: Internal Medicine

## 2010-10-20 ENCOUNTER — Encounter: Payer: Self-pay | Admitting: Internal Medicine

## 2010-10-20 DIAGNOSIS — I2510 Atherosclerotic heart disease of native coronary artery without angina pectoris: Secondary | ICD-10-CM

## 2010-10-20 DIAGNOSIS — I2589 Other forms of chronic ischemic heart disease: Secondary | ICD-10-CM

## 2010-10-20 DIAGNOSIS — I5022 Chronic systolic (congestive) heart failure: Secondary | ICD-10-CM

## 2010-10-23 NOTE — Assessment & Plan Note (Signed)
Summary: rov for osa   Copy to:  Marca Ancona MD Primary Provider/Referring Provider:  Dr. Katrinka Blazing, [facility])  CC:  f/u appt on OSA. Pt states he has not been wearing his cpap machine for 1 week d/t recent dental surgery.  Pt states prior to dental surgery and he had been wearing cpap machine every night.  States mask will occ leak. Marland Kitchen  History of Present Illness: the pt comes in today for f/u of his osa.  He was started on cpap at the last visit, and did wear complianly until he had to have major oral surgery.  He felt it did help his sleep and daytime alertness.  He had significant mouth opening with the nasal mask, so changed over to full face mask.  He was not aware he could turn up heater in order to improve his dryness.  Since having oral surgery, has not worn his cpap, but intends to restart once given the clearance by his surgeon.    Medications Prior to Update: 1)  Aspirin 81 Mg Tbec (Aspirin) .... Take One Tablet By Mouth Daily 2)  Coreg 12.5 Mg Tabs (Carvedilol) .... One and One-Half Twice A Day 3)  Plavix 75 Mg Tabs (Clopidogrel Bisulfate) .... Take One Tablet Once Daily 4)  Enalapril Maleate 10 Mg Tabs (Enalapril Maleate) .... One and One- Half  Tablet Twice A Day 5)  Furosemide 40 Mg Tabs (Furosemide) .... (1/2) Every Day 6)  Glipizide 10 Mg Tabs (Glipizide) .... Take One Tablet Two Times A Day 7)  Nitroglycerin 0.4 Mg Subl (Nitroglycerin) .... As Needed 8)  Crestor 40 Mg Tabs (Rosuvastatin Calcium) .... One Daily 9)  Spironolactone 25 Mg Tabs (Spironolactone) .... Once Daily 10)  Metformin Hcl1000 Mg Tabs (Metformin Hcl) .... One By Mouth Two Times A Day 11)  Flaxseed Oil 1200 Mg Caps (Flaxseed (Linseed)) .... Take One Capsule Once Daily 12)  Sertraline Hcl 100 Mg Tabs (Sertraline Hcl) .... 2 Once Daily  Allergies (verified): No Known Drug Allergies  Past History:  Past medical, surgical, family and social histories (including risk factors) reviewed, and no  changes noted (except as noted below).  Past Medical History: Reviewed history from 09/17/2010 and no changes required. 1.  CAD: Anterior STEMI 04/03/09.  3.0 x 15 mm Xience DES to totally occluded proximal LAD.  No obstructive disease elsewhere.   2.  Ischemic CMP: Echo (04/04/09) with EF 25-30%, anteroseptal, anterior, anterolateral, and apical akinesis.  Followup echo (9/10) with EF 35-40%, apical and anteroseptal hypokinesis, mild MR, grade II diastolic dysfunction.  Cardiac MRI (10/11): EF 34%, apical anterior, apical septal, and true apex full thickness scar, basal to mid anterior wall with 2/3 thickness scar.  Medtronic dual chamber ICD with Optivol was placed 11/11.  3.  Diabetes mellitus 4.  Learning disability 5.  Hyperlipidemia: myalgias with Lipitor 6.  Depression: Seeing a psychiatrist in [place] 7.  Suspect OSA (awaiting sleep study) 8.  Very poor vision Myocardial Infarction  Past Surgical History: Reviewed history from 08/13/2010 and no changes required. PCI LAD 7/10 Hand Surgery stent placement defibillater  Family History: Reviewed history from 08/13/2010 and no changes required. Father died with a history of diabetes,  but no history of coronary artery disease.   Mother had some heart problems-unspecified Brother died of an MI at age 65.   Another brother had CABG at age 71 stomach cancer--uncle  Social History: Reviewed history from 08/13/2010 and no changes required. Tobacco Use - No.  Alcohol  Use - no Drug Use - no Lives in [place].   Disabled due  CAD.  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  Review of Systems       The patient complains of shortness of breath with activity, shortness of breath at rest, indigestion, weight change, tooth/dental problems, headaches, and depression.  The patient denies productive cough, non-productive cough, coughing up blood, chest pain, irregular heartbeats, acid  heartburn, loss of appetite, abdominal pain, difficulty swallowing, sore throat, nasal congestion/difficulty breathing through nose, sneezing, itching, ear ache, anxiety, hand/feet swelling, joint stiffness or pain, rash, change in color of mucus, and fever.    Vital Signs:  Patient profile:   55 year old male Height:      68 inches Weight:      228.13 pounds BMI:     34.81 O2 Sat:      97 % on Room air Temp:     98.2 degrees F oral Pulse rate:   59 / minute BP sitting:   108 / 60  (right arm) Cuff size:   large  Vitals Entered By: Arman Filter LPN (October 06, 2010 9:16 AM)  O2 Flow:  Room air CC: f/u appt on OSA. Pt states he has not been wearing his cpap machine for 1 week d/t recent dental surgery.  Pt states prior to dental surgery, he had been wearing cpap machine every night.  States mask will occ leak.  Comments Medications reviewed with patient Arman Filter LPN  October 06, 2010 9:22 AM    Physical Exam  General:  obese male in nad Nose:  no skin breakdown or pressure necrosis from cpap mask bruising on face from recent dental surgery. Heart:  rrr Extremities:  no edema or cyanosis  Neurologic:  awake, but appears mildly sleepy. moves all 4.   Impression & Recommendations:  Problem # 1:  OBSTRUCTIVE SLEEP APNEA (ICD-327.23) the pt was doing well with cpap prior to his dental surgery.  He intends to restart once given the ok by his surgeon.  He felt he slept better, and had improved daytime alertness.  He had minor mask and humidity issues that he is currently working thru successfully.  I have explained that we have yet to optimize pressure for him, and would like to do this via the "auto mode" on his machine.   Care Plan:  At this point, will arrange for the patient's machine to be changed over to auto mode for 2 weeks to optimize their pressure.  I will review the downloaded data once sent by dme, and also evaluate for compliance, leaks, and residual osa.  I will  call the patient and dme to discuss the results, and have the patient's machine set appropriately.  This will serve as the pt's cpap pressure titration.  Other Orders: Est. Patient Level IV (81191) DME Referral (DME)  Patient Instructions: 1)  stick with a full face mask, not nasal.  This will give you the right pressure even if your mouth comes open during the night 2)  turn up the heater on your humidifier to get more moisture 3)  give your mouth another 2 weeks to heal before getting back on cpap. 4)  will send an order to your medical equipment company asking them to put your machine on the automatic mode so we can optimize pressure for you 5)  work on weight loss 6)  followup with me in 6mos, but will call  you with your optimal pressure.

## 2010-10-29 NOTE — Assessment & Plan Note (Signed)
Summary: 3 mth eph/mt/per after hours rs per pt call/mj/kwb   Visit Type:  Follow-up Referring Provider:  Marca Ancona MD Primary Provider:  Dr. Katrinka Blazing, [facility])  CC:   .  History of Present Illness: The patient presents today for routine electrophysiology followup. He reports doing very well since having his ICD implanted for primary prevention for sudden death. The patient denies symptoms of palpitations, chest pain, shortness of breath, orthopnea, PND, lower extremity edema, dizziness, presyncope, syncope, or neurologic sequela. The patient is tolerating medications without difficulties and is otherwise without complaint today.   Problems Prior to Update: 1)  Obstructive Sleep Apnea  (ICD-327.23) 2)  Hx of Myocardial Infarction  (ICD-410.90) 3)  Sleep Apnea  (ICD-780.57) 4)  Encounter For Long-term Use of Other Medications  (ICD-V58.69) 5)  Coronary Atherosclerosis Native Coronary Artery  (ICD-414.01) 6)  Chronic Systolic Heart Failure  (ICD-428.22) 7)  Hyperlipidemia-mixed  (ICD-272.4) 8)  Hypertension  (ICD-401.9) 9)  CHF  (ICD-428.0) 10)  Cad  (ICD-414.00) 11)  Ischemic Cardiomyopathy  (ICD-414.8) 12)  Diabetes Mellitus, Type II, Uncontrolled, With Ketoacidosis  (ICD-250.10)  Current Medications (verified): 1)  Aspirin 81 Mg Tbec (Aspirin) .... Take One Tablet By Mouth Daily 2)  Coreg 12.5 Mg Tabs (Carvedilol) .... One and One-Half Twice A Day 3)  Plavix 75 Mg Tabs (Clopidogrel Bisulfate) .... Take One Tablet Once Daily 4)  Enalapril Maleate 10 Mg Tabs (Enalapril Maleate) .... One and One- Half  Tablet Twice A Day 5)  Furosemide 40 Mg Tabs (Furosemide) .... (1/2) Every Day 6)  Glipizide 10 Mg Tabs (Glipizide) .... Take One Tablet Two Times A Day 7)  Nitroglycerin 0.4 Mg Subl (Nitroglycerin) .... As Needed 8)  Crestor 40 Mg Tabs (Rosuvastatin Calcium) .... One Daily 9)  Spironolactone 25 Mg Tabs (Spironolactone) .... Once Daily 10)  Metformin Hcl 1000 Mg Tabs  (Metformin Hcl) .... Take 1 Tablet By Mouth Two Times A Day 11)  Flaxseed Oil 1200 Mg Caps (Flaxseed (Linseed)) .... Take One Capsule Once Daily 12)  Sertraline Hcl 100 Mg Tabs (Sertraline Hcl) .... 2 Once Daily  Allergies (verified): No Known Drug Allergies  Past History:  Family History: Last updated: 15-Aug-2010 Father died with a history of diabetes,  but no history of coronary artery disease.   Mother had some heart problems-unspecified Brother died of an MI at age 54.   Another brother had CABG at age 49 stomach cancer--uncle  Past Medical History: Reviewed history from 09/17/2010 and no changes required. 1.  CAD: Anterior STEMI 04/03/09.  3.0 x 15 mm Xience DES to totally occluded proximal LAD.  No obstructive disease elsewhere.   2.  Ischemic CMP: Echo (04/04/09) with EF 25-30%, anteroseptal, anterior, anterolateral, and apical akinesis.  Followup echo (9/10) with EF 35-40%, apical and anteroseptal hypokinesis, mild MR, grade II diastolic dysfunction.  Cardiac MRI (10/11): EF 34%, apical anterior, apical septal, and true apex full thickness scar, basal to mid anterior wall with 2/3 thickness scar.  Medtronic dual chamber ICD with Optivol was placed 11/11.  3.  Diabetes mellitus 4.  Learning disability 5.  Hyperlipidemia: myalgias with Lipitor 6.  Depression: Seeing a psychiatrist in [place] 7.  Suspect OSA (awaiting sleep study) 8.  Very poor vision Myocardial Infarction  Past Surgical History: PCI LAD 7/10 Hand Surgery stent placement ICD implantation (MDT) for primary prevention by JA  Social History: Reviewed history from 08/15/10 and no changes required. Tobacco Use - No.  Alcohol Use - no Drug Use -  no Lives in [place].   Disabled due  CAD.  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  Vital Signs:  Patient profile:   55 year old male Height:      68 inches Weight:      230.50 pounds BMI:     35.17 Pulse  rate:   65 / minute Pulse rhythm:   regular Resp:     18 per minute BP sitting:   110 / 78  (left arm) Cuff size:   large  Vitals Entered By: Vikki Ports (October 20, 2010 9:47 AM)  Physical Exam  General:  Well developed, well nourished, in no acute distress. Head:  normocephalic and atraumatic Eyes:  PERRLA/EOM intact; conjunctiva and lids normal. Mouth:  Teeth, gums and palate normal. Oral mucosa normal. Neck:  supple Chest Wall:  R sided ICD pocket is well healed, non tender,  full ROM in the R arm Lungs:  Clear bilaterally to auscultation and percussion. Heart:  Non-displaced PMI, chest non-tender; regular rate and rhythm, S1, S2 without murmurs, rubs or gallops. Carotid upstroke normal, no bruit. Normal abdominal aortic size, no bruits. Femorals normal pulses, no bruits. Pedals normal pulses. No edema, no varicosities. Abdomen:  Bowel sounds positive; abdomen soft and non-tender without masses, organomegaly, or hernias noted. No hepatosplenomegaly. Msk:  Back normal, normal gait. Muscle strength and tone normal. Extremities:  No clubbing or cyanosis. Neurologic:  Alert and oriented x 3. Skin:  Intact without lesions or rashes. Psych:  Normal affect.   EKG  Procedure date:  10/20/2010  Findings:      sinus rhythm 64 bpm, septal infact, otherwise normal ekg   ICD Specifications Following MD:  Hillis Range, MD     ICD Vendor:  Medtronic     ICD Model Number:  [id number]     ICD Serial Number:  [id number] H ICD DOI:  07/18/2010     ICD Implanting MD:  Hillis Range, MD  Lead 1:    Location: RA     DOI: 07/18/2010     Model #: [id number]     Serial #: [id number]     Status: active Lead 2:    Location: RV     DOI: 07/18/2010     Model #: 6578     Serial #: [id number]     Status: active  Indications::  ICM   ICD Follow Up ICD Dependent:  No      Episodes Coumadin:  No  Brady Parameters Mode MVP     Lower Rate Limit:  60     Upper Rate Limit 130 PAV 180     Sensed AV Delay:   150  Tachy Zones VF:  200     MD Comments:  see scanned report  Impression & Recommendations:  Problem # 1:  CHRONIC SYSTOLIC HEART FAILURE (ICD-428.22) stable without significant volume overload on exam normal ICD function see scanned report  Problem # 2:  CORONARY ATHEROSCLEROSIS NATIVE CORONARY ARTERY (ICD-414.01) stable without symptoms of ischemia no changes today  Problem # 3:  HYPERTENSION (ICD-401.9) stable  Problem # 4:  HYPERLIPIDEMIA-MIXED (ICD-272.4) stable, per Dr Shirlee Latch His updated medication list for this problem includes:    Crestor 40 Mg Tabs (Rosuvastatin calcium) ..... One daily  Other Orders: EKG w/ Interpretation (93000)  Patient Instructions: 1)  Your physician recommends that you schedule a follow-up appointment in: 3 months with pacer clinic 2)  Your physician wants you to follow-up in:  12 months with Dr.Saryah Loper  You will receive a reminder letter in the mail two months in advance. If you don't receive a letter, please call our office to schedule the follow-up appointment.

## 2010-11-04 NOTE — Cardiovascular Report (Signed)
Summary: Office Visit   Office Visit   Imported By: Roderic Ovens 10/31/2010 15:15:31  _____________________________________________________________________  External Attachment:    Type:   Image     Comment:   External Document

## 2010-11-17 IMAGING — CR DG CHEST 2V
2 series · 2 of 2 positions shown · non-contrast
Comparison: 07/18/2010

CLINICAL DATA: Left ventricular dysfunction and pacemaker
placement.

CHEST - 2 VIEW

[w chest pa]
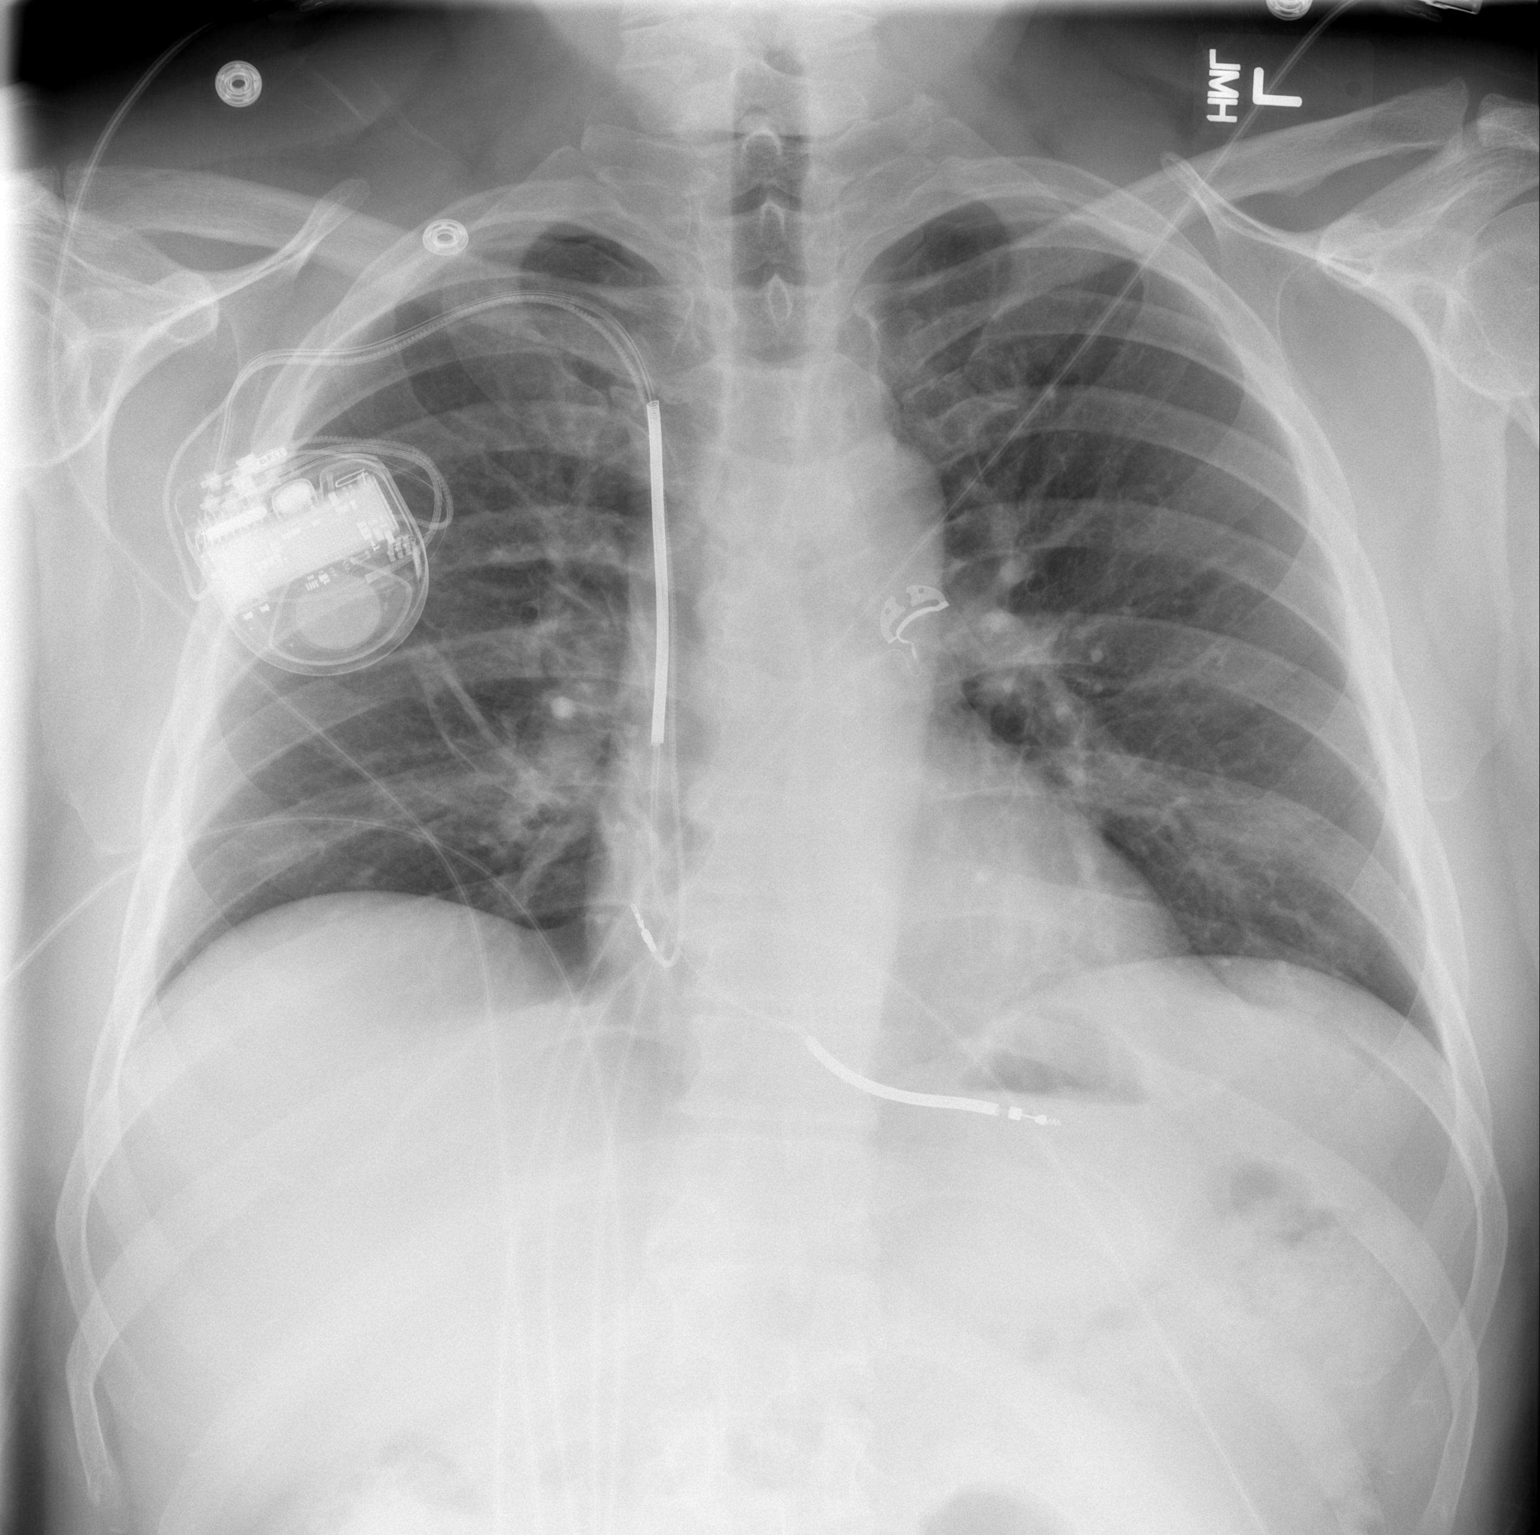

[w chest lat]
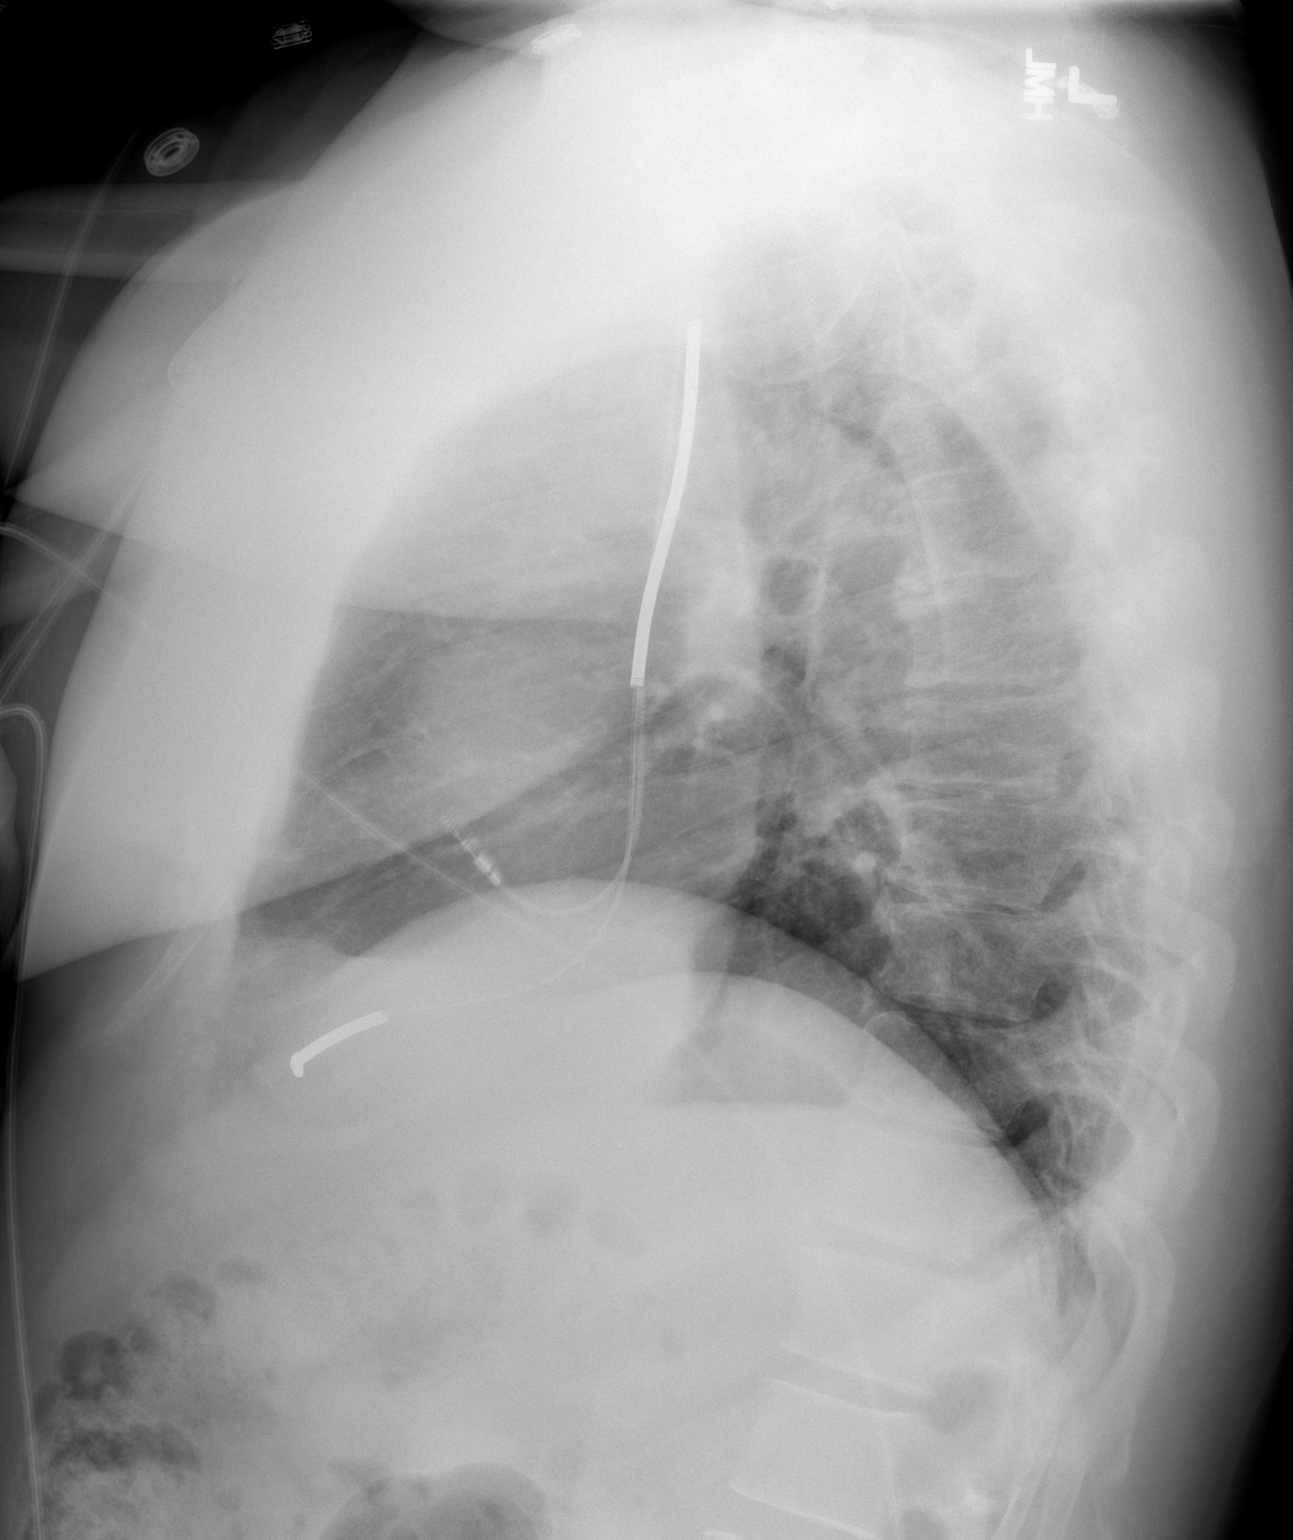

[2 of 2 positions shown; findings below may reference images not displayed]

FINDINGS: Placement of a right cardiac ICD.  Leads in the region of
the right atrium and right ventricle.  Both lungs are clear without
a pneumothorax.  Heart and mediastinum are stable.  Trachea is
midline.  No evidence for pleural effusions.  Mild degenerative
changes in the thoracic spine.  Linear densities in the right hilum
are probably related to atelectasis.
IMPRESSION: Placement of a right cardiac ICD without complicating features.

No acute chest findings.

## 2010-11-18 LAB — GLUCOSE, CAPILLARY
Glucose-Capillary: 187 mg/dL — ABNORMAL HIGH (ref 70–99)
Glucose-Capillary: 322 mg/dL — ABNORMAL HIGH (ref 70–99)
Glucose-Capillary: 376 mg/dL — ABNORMAL HIGH (ref 70–99)

## 2010-11-18 LAB — SURGICAL PCR SCREEN
MRSA, PCR: NEGATIVE
Staphylococcus aureus: POSITIVE — AB

## 2010-12-02 ENCOUNTER — Other Ambulatory Visit: Payer: Self-pay | Admitting: Pulmonary Disease

## 2010-12-02 DIAGNOSIS — G4733 Obstructive sleep apnea (adult) (pediatric): Secondary | ICD-10-CM

## 2010-12-13 LAB — CBC
HCT: 41.4 % (ref 39.0–52.0)
Hemoglobin: 14.2 g/dL (ref 13.0–17.0)
MCHC: 34.2 g/dL (ref 30.0–36.0)
Platelets: 266 10*3/uL (ref 150–400)
RDW: 13.6 % (ref 11.5–15.5)

## 2010-12-13 LAB — BASIC METABOLIC PANEL
BUN: 21 mg/dL (ref 6–23)
CO2: 28 mEq/L (ref 19–32)
GFR calc non Af Amer: >60 mL/min (ref >60)
Glucose, Bld: 134 mg/dL — ABNORMAL HIGH (ref 70–99)
Potassium: 3.8 mEq/L (ref 3.5–5.1)
Sodium: 135 mEq/L (ref 135–145)

## 2010-12-14 LAB — COMPREHENSIVE METABOLIC PANEL
AST: 140 U/L — ABNORMAL HIGH (ref 0–37)
Albumin: 3.6 g/dL (ref 3.5–5.2)
Alkaline Phosphatase: 63 U/L (ref 39–117)
BUN: 21 mg/dL (ref 6–23)
Chloride: 100 mEq/L (ref 96–112)
GFR calc Af Amer: >60 mL/min (ref >60)
Potassium: 3.6 mEq/L (ref 3.5–5.1)
Total Bilirubin: 1 mg/dL (ref 0.3–1.2)

## 2010-12-14 LAB — GLUCOSE, CAPILLARY
Glucose-Capillary: 164 mg/dL — ABNORMAL HIGH (ref 70–99)
Glucose-Capillary: 166 mg/dL — ABNORMAL HIGH (ref 70–99)
Glucose-Capillary: 167 mg/dL — ABNORMAL HIGH (ref 70–99)
Glucose-Capillary: 177 mg/dL — ABNORMAL HIGH (ref 70–99)
Glucose-Capillary: 217 mg/dL — ABNORMAL HIGH (ref 70–99)
Glucose-Capillary: 235 mg/dL — ABNORMAL HIGH (ref 70–99)

## 2010-12-14 LAB — LIPID PANEL
Cholesterol: 287 mg/dL — ABNORMAL HIGH (ref 0–200)
Total CHOL/HDL Ratio: 4.9 RATIO

## 2010-12-14 LAB — BASIC METABOLIC PANEL
BUN: 13 mg/dL (ref 6–23)
BUN: 14 mg/dL (ref 6–23)
CO2: 21 mEq/L (ref 19–32)
Calcium: 8.3 mg/dL — ABNORMAL LOW (ref 8.4–10.5)
Calcium: 8.9 mg/dL (ref 8.4–10.5)
Chloride: 99 mEq/L (ref 96–112)
Creatinine, Ser: 1.18 mg/dL (ref 0.4–1.5)
GFR calc Af Amer: >60 mL/min (ref >60)
GFR calc Af Amer: >60 mL/min (ref >60)
GFR calc non Af Amer: >60 mL/min (ref >60)
GFR calc non Af Amer: >60 mL/min (ref >60)
Glucose, Bld: 327 mg/dL — ABNORMAL HIGH (ref 70–99)
Potassium: 4.2 mEq/L (ref 3.5–5.1)
Sodium: 133 mEq/L — ABNORMAL LOW (ref 135–145)

## 2010-12-14 LAB — CARDIAC PANEL(CRET KIN+CKTOT+MB+TROPI)
CK, MB: 308.1 ng/mL — ABNORMAL HIGH (ref 0.3–4.0)
Relative Index: 6 — ABNORMAL HIGH (ref 0.0–2.5)
Relative Index: 7.4 — ABNORMAL HIGH (ref 0.0–2.5)
Total CK: 4040 U/L — ABNORMAL HIGH (ref 7–232)
Total CK: 5176 U/L — ABNORMAL HIGH (ref 7–232)
Troponin I: >100 ng/mL (ref 0.00–0.06)
Troponin I: >100 ng/mL (ref 0.00–0.06)
Troponin I: >100 ng/mL (ref 0.00–0.06)

## 2010-12-14 LAB — CBC
HCT: 43.3 % (ref 39.0–52.0)
HCT: 43.7 % (ref 39.0–52.0)
Hemoglobin: 14.9 g/dL (ref 13.0–17.0)
MCHC: 33.6 g/dL (ref 30.0–36.0)
MCV: 92.9 fL (ref 78.0–100.0)
MCV: 94.2 fL (ref 78.0–100.0)
Platelets: 251 10*3/uL (ref 150–400)
Platelets: 282 10*3/uL (ref 150–400)
Platelets: 307 10*3/uL (ref 150–400)
RBC: 4.64 MIL/uL (ref 4.22–5.81)
RBC: 4.69 MIL/uL (ref 4.22–5.81)
RBC: 5.07 MIL/uL (ref 4.22–5.81)
RDW: 13.6 % (ref 11.5–15.5)
RDW: 13.8 % (ref 11.5–15.5)
RDW: 14.1 % (ref 11.5–15.5)
WBC: 10.1 10*3/uL (ref 4.0–10.5)
WBC: 9.9 10*3/uL (ref 4.0–10.5)

## 2010-12-14 LAB — HEMOGLOBIN A1C: Hgb A1c MFr Bld: 10 % — ABNORMAL HIGH (ref 4.6–6.1)

## 2011-01-19 ENCOUNTER — Ambulatory Visit (INDEPENDENT_AMBULATORY_CARE_PROVIDER_SITE_OTHER): Payer: Medicaid Other | Admitting: *Deleted

## 2011-01-19 DIAGNOSIS — I2589 Other forms of chronic ischemic heart disease: Secondary | ICD-10-CM

## 2011-01-19 DIAGNOSIS — I509 Heart failure, unspecified: Secondary | ICD-10-CM

## 2011-01-20 NOTE — H&P (Signed)
Zachary Berry, Zachary Berry NO.:  [id number]   MEDICAL RECORD NO.:  [id number]          PATIENT TYPE:  INP   LOCATION:  2913                         FACILITY:  [facility]   PHYSICIAN:  Marca Ancona, MD      DATE OF BIRTH:  09-28-1955   DATE OF ADMISSION:  04/03/2009  DATE OF DISCHARGE:                              HISTORY & PHYSICAL   PRIMARY CARE PHYSICIAN:  Dr. Steva Ready in [place], [place].   PRIMARY CARDIOLOGIST:  None.   CHIEF COMPLAINT:  Possible ST-elevation myocardial infarction/chest  pain.   HISTORY OF PRESENT ILLNESS:  Zachary Berry is a 55 year old male with no  previous history of coronary artery disease.  Today at approximately  8:30, he had onset of substernal chest pain.  It was associated with  shortness of breath, diaphoresis, and radiation to the left.  When his  symptoms did not resolve, he went to [facility].  He took 2 full  strength chewable aspirins at home.  At [facility], his EKG was  abnormal, and he was treated with sublingual nitroglycerin as well as IV  morphine, heparin, and Lipitor.  He had hypotension with the sublingual  nitroglycerin, but this was treated with fluid resuscitation  successfully.  His EKG remains abnormal, and they transferred him to  [facility] for further evaluation and urgent catheterization.  His chest  pain at its worse with 8/10 and currently it is 5/10 after treatment by  the ER staff in [place].   PAST MEDICAL HISTORY:  Zachary Berry denies a history of diabetes,  hypertension, or hyperlipidemia.  He denies history of tobacco use.  He  does have family history of coronary artery disease.   PAST SURGICAL HISTORY:  Hand surgery.   SOCIAL HISTORY:  He lives in [place], [place].  He denies any  history of alcohol, tobacco, or drug abuse.   FAMILY HISTORY:  His father died with a history of diabetes, but no  history of coronary artery disease.  His mother had some heart problems,  and  his brother died of an MI at age 13.   REVIEW OF SYSTEMS:  He has rare reflux symptoms which he treats with  over-the-counter antacids successfully.  He denies hematemesis,  hemoptysis, or melena.  He has had no recent illnesses, fevers, or  chills.  He has occasional musculoskeletal aches and pains.  He denies  any previous episodes of chest pain as well as any syncope or  presyncope, palpitations, or ischemic symptoms.  Full 14-point review of  systems is otherwise negative.   PHYSICAL EXAM:  GENERAL:  He is a well-developed, slightly obese white  male in mild to moderate distress.  HEENT:  Essentially normal.  NECK:  There is no lymphadenopathy, thyromegaly, bruit, or JVD noted.  CV:  His heart is regular in rate and rhythm with an S1 and S2, and no  significant murmur, rub, or gallop is noted.  Distal pulses are intact  in all four extremities and no femoral bruits are appreciated.  LUNGS:  Essentially clear to auscultation bilaterally.  SKIN:  No rashes or lesions are  noted.  ABDOMEN:  Soft and nontender with active bowel sounds.  EXTREMITIES:  There is no cyanosis, clubbing, or edema noted.  MUSCULOSKELETAL:  There is no joint deformity or effusions, and no spine  or CVA tenderness.  NEURO:  He is alert and oriented.  Cranial nerves II to XII grossly  intact.   LABORATORY DATA:  Laboratory values (performed at [facility])  sodium 138, potassium 3.6, chloride 101, CO2 23, BUN 16, creatinine 0.9,  glucose 241, hemoglobin 16, hematocrit 45.9, WBCs 8.9, platelets 305.  INR 1.0, PTT 22.7.   ALLERGIES:  No known drug allergies.   MEDICATIONS:  No home medications.   EKG:  Sinus rhythm rate 76 with inferolateral ST depression and possible  posterior ST elevation.   IMPRESSION:  Possible ST elevation myocardial infarction:  Zachary Berry  was seen today by Dr. Shirlee Latch.  With ongoing chest pain and a  significantly abnormal EKG, urgent cardiac catheterization is indicated.   The risks and benefits were discussed with the patient who indicates  understanding and agrees to proceed.  Further evaluation and treatment  will depend on the results of the catheterization.  We will also follow  his blood sugars closely with an elevated blood sugar and check a lipid  profile as well as a TSH.       Theodore Demark, PA-C      Marca Ancona, MD  Electronically Signed    RB/MEDQ  D:  04/03/2009  T:  04/04/2009  Job:  [id number]

## 2011-01-20 NOTE — Cardiovascular Report (Signed)
Zachary Berry, PASSOW NO.:  [id number]   MEDICAL RECORD NO.:  [id number]          PATIENT TYPE:  INP   LOCATION:  2913                         FACILITY:  [facility]   PHYSICIAN:  Marca Ancona, MD      DATE OF BIRTH:  1956-04-12   DATE OF PROCEDURE:  04/03/2009  DATE OF DISCHARGE:                            CARDIAC CATHETERIZATION   PROCEDURE:  1. Left heart catheterization.  2. Coronary angiography.  3. Left ventriculography.   INDICATIONS:  Myocardial infarction in a 55 year old patient with no  significant risk factors other than premature coronary artery disease in  his brother.   PROCEDURE NOTE:  After informed consent was obtained, the right groin  was sterilely prepped and draped.  Lidocaine 1% was used locally to  anesthetize the right groin area.  A 6-French arterial sheath was placed  in the right common femoral artery using a Seldinger technique.  The  left coronary artery was engaged using the JL-4 catheter.  The right  coronary artery was engaged using the JR-4 catheter and the left  ventricle was entered using the angled pigtail catheter.  There were no  complications.   FINDINGS:  1. Left ventriculography EF was estimated to be about 30%.  The      anterior wall was akinetic.  The apex was dyskinetic.  The inferior      wall was hyperkinetic.  2. Hemodynamics, aorta 88/66, LV 90/25.  3. RCA:  The right coronary artery is dominant.  There are diffuse      luminal irregularities but no significant stenosis.  4. Left main: There is about 20% diffuse left main stenosis.  5. Left circumflex system:  There was a large ramus with about 20%      ostial stenosis.  There was a small circumflex with a single obtuse      marginal which had about 30% ostial stenosis.  6. LAD system.  The LAD was totally occluded proximally.   ASSESSMENT AND PLAN:  There is an acute proximal left anterior  descending occlusion.  The anterior wall is akinetic.  We will plan  for  urgent percutaneous coronary intervention.      Marca Ancona, MD  Electronically Signed     DM/MEDQ  D:  04/03/2009  T:  04/04/2009  Job:  [id number]

## 2011-01-20 NOTE — Discharge Summary (Signed)
NAMESARGENT, MANKEY            ACCOUNT NO.:  [id number]   MEDICAL RECORD NO.:  [id number]          PATIENT TYPE:  INP   LOCATION:  3711                         FACILITY:  [facility]   PHYSICIAN:  Madolyn Frieze. Jens Som, MD, FACCDATE OF BIRTH:  1955/10/24   DATE OF ADMISSION:  04/03/2009  DATE OF DISCHARGE:  04/07/2009                               DISCHARGE SUMMARY   PRIMARY CARDIOLOGIST:  Marca Ancona, MD   PRIMARY CARE Jakorey Mcconathy:  Sharyne Peach, MD   DISCHARGE DIAGNOSIS:  Acute anterior myocardial infarction.   SECONDARY DIAGNOSES:  1. Coronary artery disease, status post successful percutaneous      coronary intervention and stenting of the proximal left anterior      descending, placement of 3.0 x 15-mm XIENCE V drug-eluting stent      this admission.  2. Acute systolic congestive heart failure.  3. Ischemic cardiomyopathy and ejection fraction of 25-30% by      echocardiogram this admission.  4. Newly-diagnosed type 2 diabetes mellitus.   ALLERGIES:  No known drug allergies.   PROCEDURE:  As left heart cardiac catheterization, performed on April 03, 2009, revealing an occluded proximal LAD and otherwise nonobstructive  disease.  EF was 30% with anterior akinesis and apical dyskinesis.  The  LAD was successfully stented with a 3.0 x 15-mm XIENCE V drug-eluting  stent.  A 2-D echocardiogram, performed on April 04, 2009, showing an EF  of 25-30% with akinesis of the anteroseptal, anterior, anterolateral,  and apical walls.  There was hypokinesis of the lateral myocardium.  The  left atrium was mildly dilated.   HISTORY OF PRESENT ILLNESS:  A 55 year old male without prior cardiac  history.  He was on no medications at home.  Approximately 8:30 in the  morning on the day of admission, he had acute onset of substernal chest  discomfort associated with dyspnea, diaphoresis and radiation to left  arm.  The patient was presented to the [facility] where he was  given two aspirin  and sublingual nitroglycerin followed by IV morphine,  heparin, and Lipitor.  ECG was abnormal with inferolateral ST-T segment  depression.  The patient was transferred urgently to [facility].  He was  seen emergency department with ongoing chest pain and decision was made  to pursue cardiac catheterization.   HOSPITAL COURSE:  The patient underwent urgent left heart cardiac  catheterization revealing an occluded proximal LAD, but otherwise  nonobstructive disease, an EF of 30% with anterior akinesis and apical  dyskinesis.  Films were reviewed and decision was made to pursue PCI of  the LAD.  This was performed successfully with placement of a 3.0 x 15-  mm XIENCE drug-eluting stent.  The patient tolerated the procedure well  and postprocedure, he was transferred to the coronary intensive care  unit.  Shortly thereafter, he developed acute dyspnea with coughing.  The patient desaturated and was felt to be an acute pulmonary edema.  He  was initiated on intravenous Lasix.  Chest x-ray at that time showed  mixed interstitial airspace disease due to edema or pneumonia.   The patient was maintained on  intravenous Lasix and was felt to have  continued volume overload.  He was placed on beta-blocker and low-dose  ACE inhibitor therapy and subsequently spironolactone in the setting of  heart failure with acute coronary syndrome.  A 2-D echocardiogram was  performed on April 04, 2009, and although did show significantly reduced  LV function with EF of 25-30% with multiple wall motion abnormalities.  There was no identifiable LV thrombus.   With continued diuresis, the patient had clinical and symptomatic  improvement.  His Lasix dose was changed to p.o. Lasix on April 06, 2009.   Mr. Mines has been found to be hyperglycemic throughout his  admission.  He had no prior documented history of diabetes.  His  hemoglobin A1c was 10.0.  He has been initiated on glipizide therapy and  while  hospitalized, he also on Lantus.  We have recommended a followup  with his primary care Alyanah Elliott for additional diabetes evaluation and  management.   Mr. Roessner was transferred out to the floor and has been ambulating  without recurrent symptoms or limitations.  He has been seen by Cardiac  Rehab and referral for outpatient Cardiac Rehab has been made.  He will  be discharged home today in good condition.   DISCHARGE LABS:  Hemoglobin 14.2, hematocrit 41.4, WBC 8.9, and  platelets 266.  Sodium 135, potassium 3.8, chloride 100, CO2 28, BUN 21,  creatinine 0.98, glucose 134, total bilirubin 1.0, alkaline phosphatase  63, AST 140, ALT 67, total protein 6.8, albumin 3.6, calcium 8.9,  hemoglobin A1c 10.0, CK 2819, MB 181.1, troponin I greater than 100,  total cholesterol 287, triglycerides 200, HDL 58, LDL 189.  TSH 0.497.   DISPOSITION:  The patient will be discharged home today in good  condition.   FOLLOWUP PLANS AND APPOINTMENTS:  We will arrange for followup with Dr.  Shirlee Latch in approximately 2 weeks.  We have asked him to follow up with  Dr. Steva Ready within the next week for further evaluation and management of  diabetes.  The patient will also require repeat basic metabolic panel.  The patient will need followup lipids and LFTs in approximately 6-8  weeks as we had been initiated statin therapy.  Finally, he will need  followup 2-D echocardiogram in approximately 12 weeks to evaluate LV  function and determine his candidacy for ICD placement.   DISCHARGE MEDICATIONS:  1. Aspirin 325 mg daily.  2. Coreg 6.25 mg b.i.d.  3. Plavix 75 mg daily.  4. Enalapril 2.5 mg b.i.d.  5. Lasix 20 mg every other day.  6. Glipizide 10 mg b.i.d.  7. Nitroglycerin 0.4 mg sublingual p.r.n. chest pain.  8. Rosuvastatin 40 mg daily.  9. Spironolactone 25 mg daily.   OUTSTANDING LABS AND STUDIES:  None.  Followup BMET this week.   DURATION OF DISCHARGE ENCOUNTER:  Forty five minutes including  physician  time.      Nicolasa Ducking, ANP      Madolyn Frieze. Jens Som, MD, [facility]  Electronically Signed    CB/MEDQ  D:  04/07/2009  T:  04/07/2009  Job:  [id number]   cc:   Sharyne Peach

## 2011-03-03 ENCOUNTER — Other Ambulatory Visit: Payer: Self-pay | Admitting: *Deleted

## 2011-03-03 MED ORDER — CLOPIDOGREL BISULFATE 75 MG PO TABS: 75.0000 mg | ORAL_TABLET | Freq: Every day | ORAL | Status: DC

## 2011-03-04 ENCOUNTER — Other Ambulatory Visit: Payer: Self-pay | Admitting: *Deleted

## 2011-03-04 MED ORDER — CLOPIDOGREL BISULFATE 75 MG PO TABS: 75.0000 mg | ORAL_TABLET | Freq: Every day | ORAL | Status: DC

## 2011-03-09 ENCOUNTER — Telehealth: Payer: Self-pay | Admitting: Cardiology

## 2011-03-09 ENCOUNTER — Other Ambulatory Visit: Payer: Self-pay | Admitting: *Deleted

## 2011-03-09 MED ORDER — ENALAPRIL MALEATE 10 MG PO TABS: ORAL_TABLET | ORAL | Status: DC

## 2011-03-09 NOTE — Telephone Encounter (Signed)
Enalapril 10 mg was requested Friday per pt, not called in yet and needs asap, [facility]

## 2011-03-14 ENCOUNTER — Other Ambulatory Visit: Payer: Self-pay | Admitting: Physician Assistant

## 2011-03-14 NOTE — Telephone Encounter (Signed)
Patient called in for refills on Crestor. CHL not available to me earlier today.  Therefore, I refilled x 1 month at [facility] ([phone]).

## 2011-03-16 ENCOUNTER — Other Ambulatory Visit: Payer: Self-pay | Admitting: *Deleted

## 2011-03-16 MED ORDER — ROSUVASTATIN CALCIUM 40 MG PO TABS: 40.0000 mg | ORAL_TABLET | Freq: Every day | ORAL | Status: DC

## 2011-03-23 ENCOUNTER — Telehealth: Payer: Self-pay | Admitting: Cardiology

## 2011-03-23 MED ORDER — CARVEDILOL 12.5 MG PO TABS: ORAL_TABLET | ORAL | Status: DC

## 2011-03-23 NOTE — Telephone Encounter (Signed)
Pharm. Calling to check on status of carvedilol RX. Called in refill request several times last week, haven't heard back.

## 2011-03-25 ENCOUNTER — Other Ambulatory Visit: Payer: Self-pay | Admitting: *Deleted

## 2011-03-25 ENCOUNTER — Telehealth: Payer: Self-pay | Admitting: Internal Medicine

## 2011-03-25 NOTE — Telephone Encounter (Signed)
Refill all medication .  [facility]. Fax #-[phone]

## 2011-03-26 MED ORDER — ENALAPRIL MALEATE 10 MG PO TABS: ORAL_TABLET | ORAL | Status: DC

## 2011-03-26 MED ORDER — CLOPIDOGREL BISULFATE 75 MG PO TABS: 75.0000 mg | ORAL_TABLET | Freq: Every day | ORAL | Status: DC

## 2011-03-26 MED ORDER — FUROSEMIDE 40 MG PO TABS: 20.0000 mg | ORAL_TABLET | Freq: Every day | ORAL | Status: DC

## 2011-03-26 MED ORDER — CARVEDILOL 12.5 MG PO TABS: ORAL_TABLET | ORAL | Status: DC

## 2011-03-26 MED ORDER — SPIRONOLACTONE 100 MG PO TABS: 100.0000 mg | ORAL_TABLET | Freq: Every day | ORAL | Status: DC

## 2011-03-26 MED ORDER — ROSUVASTATIN CALCIUM 40 MG PO TABS: 40.0000 mg | ORAL_TABLET | Freq: Every day | ORAL | Status: DC

## 2011-03-26 NOTE — Telephone Encounter (Signed)
All cardiac medications sent to [facility].

## 2011-04-03 ENCOUNTER — Encounter: Payer: Self-pay | Admitting: Pulmonary Disease

## 2011-04-06 ENCOUNTER — Ambulatory Visit: Payer: Self-pay | Admitting: Pulmonary Disease

## 2011-04-09 ENCOUNTER — Other Ambulatory Visit: Payer: Self-pay | Admitting: Cardiovascular Disease

## 2011-04-09 ENCOUNTER — Ambulatory Visit (INDEPENDENT_AMBULATORY_CARE_PROVIDER_SITE_OTHER): Payer: Medicaid Other | Admitting: Pulmonary Disease

## 2011-04-09 ENCOUNTER — Encounter: Payer: Self-pay | Admitting: Pulmonary Disease

## 2011-04-09 VITALS — BP 110/72 | HR 70 | Temp 98.1°F | Ht 68.0 in | Wt 225.6 lb

## 2011-04-09 DIAGNOSIS — G4733 Obstructive sleep apnea (adult) (pediatric): Secondary | ICD-10-CM

## 2011-04-09 NOTE — Patient Instructions (Signed)
Will send an order to [facility] so they can make sure your machine is set on 12. Work on weight loss followup with me in one year if doing well.

## 2011-04-09 NOTE — Progress Notes (Signed)
  Subjective:    Patient ID: Zachary Berry, male    DOB: 07-May-1956, 55 y.o.   MRN: [id number]  HPI The pt comes in today for f/u of his known osa.  He is wearing cpap compliantly, and is having no issues with mask fit or pressure.  He has seen improvement in his symptomatology, and is trying to work on weight reduction.    Review of Systems  Constitutional: Negative for fever and unexpected weight change.  HENT: Negative for ear pain, nosebleeds, congestion, sore throat, rhinorrhea, sneezing, trouble swallowing, dental problem, postnasal drip and sinus pressure.   Eyes: Negative for redness and itching.  Respiratory: Negative for cough, chest tightness, shortness of breath and wheezing.   Cardiovascular: Negative for palpitations and leg swelling.  Gastrointestinal: Negative for nausea and vomiting.  Genitourinary: Negative for dysuria.  Musculoskeletal: Negative for joint swelling.  Skin: Negative for rash.  Neurological: Negative for headaches.  Hematological: Does not bruise/bleed easily.  Psychiatric/Behavioral: Negative for dysphoric mood. The patient is not nervous/anxious.        Objective:   Physical Exam Obese male in nad No skin breakdown or pressure necrosis from cpap mask LE with no significant edema, no cyanosis Alert, does not appear sleepy, moves all 4        Assessment & Plan:

## 2011-04-14 NOTE — Assessment & Plan Note (Signed)
The pt is doing well with cpap, and reports no issues with mask fit or pressure.  He feels he is sleeping better with improved daytime alertness.  I have asked him to keep up with supplies and mask changes, and to work on weight loss.  He will f/u with me in one year.

## 2011-04-22 ENCOUNTER — Encounter: Payer: Self-pay | Admitting: Internal Medicine

## 2011-04-22 ENCOUNTER — Ambulatory Visit (INDEPENDENT_AMBULATORY_CARE_PROVIDER_SITE_OTHER): Payer: Medicaid Other | Admitting: *Deleted

## 2011-04-22 DIAGNOSIS — I509 Heart failure, unspecified: Secondary | ICD-10-CM

## 2011-04-22 DIAGNOSIS — I5022 Chronic systolic (congestive) heart failure: Secondary | ICD-10-CM

## 2011-04-22 DIAGNOSIS — I428 Other cardiomyopathies: Secondary | ICD-10-CM

## 2011-04-22 LAB — ICD DEVICE OBSERVATION
AL AMPLITUDE: 3.3 mv
BATTERY VOLTAGE: 3.2 V
RV LEAD AMPLITUDE: 17.5 mv
RV LEAD IMPEDENCE ICD: 513 Ohm
TZAT-0001ATACH: 2
TZAT-0001ATACH: 3
TZAT-0001FASTVT: 1
TZAT-0001SLOWVT: 1
TZAT-0002FASTVT: NEGATIVE
TZAT-0012ATACH: 150 ms
TZAT-0012ATACH: 150 ms
TZAT-0012SLOWVT: 170 ms
TZAT-0018ATACH: NEGATIVE
TZAT-0018FASTVT: NEGATIVE
TZAT-0020ATACH: 1.5 ms
TZAT-0020ATACH: 1.5 ms
TZON-0003ATACH: 350 ms
TZON-0003VSLOWVT: 340 ms
TZON-0004SLOWVT: 16
TZON-0004VSLOWVT: 32
TZON-0005SLOWVT: 12
TZST-0001ATACH: 4
TZST-0001ATACH: 6
TZST-0001FASTVT: 4
TZST-0001FASTVT: 6
TZST-0001SLOWVT: 2
TZST-0001SLOWVT: 3
TZST-0001SLOWVT: 4
TZST-0002FASTVT: NEGATIVE
TZST-0002FASTVT: NEGATIVE
TZST-0002SLOWVT: NEGATIVE
TZST-0002SLOWVT: NEGATIVE

## 2011-04-22 NOTE — Progress Notes (Signed)
icd check in clinic  

## 2011-05-27 ENCOUNTER — Ambulatory Visit: Payer: Medicaid Other | Admitting: Cardiology

## 2011-07-06 ENCOUNTER — Other Ambulatory Visit: Payer: Self-pay | Admitting: *Deleted

## 2011-07-06 MED ORDER — SPIRONOLACTONE 100 MG PO TABS: 100.0000 mg | ORAL_TABLET | Freq: Every day | ORAL | Status: DC

## 2011-07-20 ENCOUNTER — Other Ambulatory Visit: Payer: Self-pay | Admitting: *Deleted

## 2011-07-20 MED ORDER — ENALAPRIL MALEATE 10 MG PO TABS: ORAL_TABLET | ORAL | Status: DC

## 2011-07-20 MED ORDER — FUROSEMIDE 40 MG PO TABS: 20.0000 mg | ORAL_TABLET | Freq: Every day | ORAL | Status: DC

## 2011-07-22 ENCOUNTER — Ambulatory Visit (INDEPENDENT_AMBULATORY_CARE_PROVIDER_SITE_OTHER): Payer: Medicaid Other | Admitting: *Deleted

## 2011-07-22 ENCOUNTER — Encounter: Payer: Self-pay | Admitting: Cardiology

## 2011-07-22 ENCOUNTER — Ambulatory Visit (INDEPENDENT_AMBULATORY_CARE_PROVIDER_SITE_OTHER): Payer: Medicaid Other | Admitting: Cardiology

## 2011-07-22 DIAGNOSIS — E785 Hyperlipidemia, unspecified: Secondary | ICD-10-CM

## 2011-07-22 DIAGNOSIS — I509 Heart failure, unspecified: Secondary | ICD-10-CM

## 2011-07-22 DIAGNOSIS — R0602 Shortness of breath: Secondary | ICD-10-CM

## 2011-07-22 DIAGNOSIS — I2510 Atherosclerotic heart disease of native coronary artery without angina pectoris: Secondary | ICD-10-CM

## 2011-07-22 DIAGNOSIS — I5022 Chronic systolic (congestive) heart failure: Secondary | ICD-10-CM

## 2011-07-22 DIAGNOSIS — I428 Other cardiomyopathies: Secondary | ICD-10-CM

## 2011-07-22 LAB — LIPID PANEL
Cholesterol: 83 mg/dL (ref 0–200)
HDL: 35.2 mg/dL — ABNORMAL LOW (ref >39.00)
VLDL: 39.8 mg/dL (ref 0.0–40.0)

## 2011-07-22 LAB — ICD DEVICE OBSERVATION
AL AMPLITUDE: 3.5 mv
AL THRESHOLD: 0.625 V
BAMS-0001: 170 {beats}/min
FVT: 0
PACEART VT: 0
RV LEAD AMPLITUDE: 19.375 mv
RV LEAD THRESHOLD: 0.5 V
TOT-0001: 1
TZAT-0001ATACH: 3
TZAT-0001FASTVT: 1
TZAT-0002ATACH: NEGATIVE
TZAT-0012ATACH: 150 ms
TZAT-0012ATACH: 150 ms
TZAT-0012FASTVT: 170 ms
TZAT-0012SLOWVT: 170 ms
TZAT-0018FASTVT: NEGATIVE
TZAT-0018SLOWVT: NEGATIVE
TZAT-0019ATACH: 6 V
TZAT-0019FASTVT: 8 V
TZAT-0020ATACH: 1.5 ms
TZAT-0020ATACH: 1.5 ms
TZAT-0020ATACH: 1.5 ms
TZAT-0020FASTVT: 1.5 ms
TZAT-0020SLOWVT: 1.5 ms
TZON-0003ATACH: 350 ms
TZON-0003SLOWVT: 360 ms
TZON-0003VSLOWVT: 340 ms
TZON-0004SLOWVT: 16
TZST-0001ATACH: 4
TZST-0001ATACH: 5
TZST-0001ATACH: 6
TZST-0001FASTVT: 2
TZST-0001FASTVT: 6
TZST-0001SLOWVT: 2
TZST-0001SLOWVT: 4
TZST-0001SLOWVT: 5
TZST-0001SLOWVT: 6
TZST-0002ATACH: NEGATIVE
TZST-0002ATACH: NEGATIVE
TZST-0002FASTVT: NEGATIVE
TZST-0002FASTVT: NEGATIVE
TZST-0002FASTVT: NEGATIVE
TZST-0002SLOWVT: NEGATIVE
TZST-0002SLOWVT: NEGATIVE
VF: 0

## 2011-07-22 LAB — BASIC METABOLIC PANEL
BUN: 22 mg/dL (ref 6–23)
Calcium: 10.3 mg/dL (ref 8.4–10.5)
Creatinine, Ser: 1 mg/dL (ref 0.4–1.5)
GFR: 84.17 mL/min (ref >60.00)
Glucose, Bld: 236 mg/dL — ABNORMAL HIGH (ref 70–99)

## 2011-07-22 LAB — BRAIN NATRIURETIC PEPTIDE: Pro B Natriuretic peptide (BNP): 17 pg/mL (ref 0.0–100.0)

## 2011-07-22 MED ORDER — CARVEDILOL 25 MG PO TABS: 25.0000 mg | ORAL_TABLET | Freq: Two times a day (BID) | ORAL | Status: DC

## 2011-07-22 MED ORDER — SPIRONOLACTONE 25 MG PO TABS: 25.0000 mg | ORAL_TABLET | Freq: Every day | ORAL | Status: DC

## 2011-07-22 NOTE — Patient Instructions (Signed)
Decrease spironolactone to 25mg  daily.  Increase coreg(carvedilol) to 25mg  twice a day. You can take two 12.5mg  tablets twice a day.   Your physician recommends that you have  a FASTING lipid profile /BMP/BNP 428.22 414.01 today.  Your physician recommends that you return for lab work in: 3 months when you see Dr Johney Frame. BMET 428.22 414.01  Your physician wants you to follow-up in: 6 months with Dr Shirlee Latch. (May 2013). You will receive a reminder letter in the mail two months in advance. If you don't receive a letter, please call our office to schedule the follow-up appointment.

## 2011-07-22 NOTE — Progress Notes (Signed)
ICD check with ICM 

## 2011-07-23 NOTE — Assessment & Plan Note (Signed)
Euvolemic on exam and by Optivol.  NYHA class II-III symptoms.    - Continue enalapril and Lasix at current doses - Increase Coreg to 25 mg bid - I am not sure why he is taking spironolactone 100 mg daily.  Decrease spironolactone to 25 mg daily and check BMET for K level today.  - I encouraged him to try to get more regular exercise.  Ideally, walking for 30 minutes 5-6 days a week.

## 2011-07-23 NOTE — Progress Notes (Signed)
PCP: Dr. Alben Spittle at [facility] in [place]  55 yo with history of CAD and ischemic CMP returns for followup.  He has had mild left lateral nonexertional chest pain since his MI in 2010.  This pattern has continued unchanged.  He fatigues easily.  He can walk about 100 yards before getting short of breath.  He occasionally walks at [facility] for exercise.  Patient is on spironolactone 100 mg daily.  I am not sure how he got on this or how long he has been taking this.  I had him on spironolactone 25 mg daily when I last saw him.  He has lost 14 lbs since last appointment (has been working on diet).  Patient is also now using CPAP.   Optivol was assessed today suggesting stable volume status (below threshold).   ECG: NSR, lateral T wave flattening, poor anterior R wave progression.   Las (10/11): LDL 32, HDL 42  Labs (11/11): K 4.4, creatinine 0.8  Labs (1/12): K 4.8, creatinine 0.7, BNP 40  Allergies (verified):  No Known Drug Allergies   Past Medical History:  1. CAD: Anterior STEMI 04/03/09. 3.0 x 15 mm Xience DES to totally occluded proximal LAD. No obstructive disease elsewhere.  2. Ischemic CMP: Echo (04/04/09) with EF 25-30%, anteroseptal, anterior, anterolateral, and apical akinesis. Followup echo (9/10) with EF 35-40%, apical and anteroseptal hypokinesis, mild MR, grade II diastolic dysfunction. Cardiac MRI (10/11): EF 34%, apical anterior, apical septal, and true apex full thickness scar, basal to mid anterior wall with 2/3 thickness scar. Medtronic dual chamber ICD with Optivol was placed 11/11.  3. Diabetes mellitus  4. Learning disability  5. Hyperlipidemia: myalgias with Lipitor  6. Depression: Seeing a psychiatrist in [place]  7. OSA on CPAP 8. Very poor vision   Family History:  Father died with a history of diabetes,  but no history of coronary artery disease.  Mother had some heart problems-unspecified  Brother died of an MI at age 65.  Another brother had CABG at age 21    stomach cancer--uncle   Social History:  Tobacco Use - No.  Alcohol Use - no  Drug Use - no  Lives in [place]. Disabled due CAD. 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  Review of Systems  All systems reviewed and negative except as per HPI.   Current Outpatient Prescriptions  Medication Sig Dispense Refill  . aspirin 81 MG tablet Take 81 mg by mouth daily.        . clopidogrel (PLAVIX) 75 MG tablet Take 1 tablet (75 mg total) by mouth daily.  90 tablet  3  . enalapril (VASOTEC) 10 MG tablet 1 1/2 po bid  180 tablet  3  . Flaxseed, Linseed, (FLAXSEED OIL) 1200 MG CAPS Take by mouth daily.       . furosemide (LASIX) 40 MG tablet Take 0.5 tablets (20 mg total) by mouth daily.  90 tablet  3  . glipiZIDE (GLUCOTROL) 10 MG tablet Take 10 mg by mouth 2 (two) times daily before a meal.        . metFORMIN (GLUCOPHAGE) 1000 MG tablet Take 1,000 mg by mouth 2 (two) times daily with a meal.        . nitroGLYCERIN (NITROSTAT) 0.4 MG SL tablet Place 0.4 mg under the tongue every 5 (five) minutes as needed.        . rosuvastatin (CRESTOR) 40 MG tablet Take 1 tablet (  40 mg total) by mouth daily.  90 tablet  1  . sertraline (ZOLOFT) 100 MG tablet Take 200 mg by mouth daily.       . carvedilol (COREG) 25 MG tablet Take 1 tablet (25 mg total) by mouth 2 (two) times daily.  180 tablet  3  . spironolactone (ALDACTONE) 25 MG tablet Take 1 tablet (25 mg total) by mouth daily.  30 tablet  6    BP 118/74  Pulse 61  Ht 5\' 8"  (1.727 m)  Wt 97.523 kg (215 lb)  BMI 32.69 kg/m2 General: NAD Neck: Thick, no JVD, no thyromegaly or thyroid nodule.  Lungs: Clear to auscultation bilaterally with normal respiratory effort. CV: Nondisplaced PMI.  Heart regular S1/S2, no S3/S4, no murmur.  No peripheral edema.  No carotid bruit.  Normal pedal pulses.  Abdomen: Soft, nontender, no hepatosplenomegaly, no distention.  Neurologic: Alert and oriented x 3.   Psych: Normal affect. Extremities: No clubbing or cyanosis.

## 2011-07-23 NOTE — Assessment & Plan Note (Signed)
Atypical chest pain.  I think this is probably noncardiac.  He has had the same symptoms long-term with no change.  Continue ASA 81.

## 2011-07-23 NOTE — Assessment & Plan Note (Signed)
Check lipids today, goal LDL < 70.  

## 2011-08-05 ENCOUNTER — Other Ambulatory Visit: Payer: Self-pay | Admitting: Internal Medicine

## 2011-08-05 MED ORDER — ROSUVASTATIN CALCIUM 40 MG PO TABS: 40.0000 mg | ORAL_TABLET | Freq: Every day | ORAL | Status: DC

## 2011-08-05 NOTE — Telephone Encounter (Signed)
New Msg: Pt calling stating that he is now out of medication. Please call this rx in asap.

## 2011-08-05 NOTE — Telephone Encounter (Signed)
Pt calling back also needing carvedilol 25 mg, he said dosage was changed to two tabs twice a day, uses [facility] on [address], pt out needs asap

## 2011-08-10 ENCOUNTER — Other Ambulatory Visit: Payer: Self-pay | Admitting: *Deleted

## 2011-08-10 NOTE — Telephone Encounter (Signed)
Called MEDICAID and spoke with Vernona Rieger.  Crestor 40 MG was approved for 1 year starting today.  Caralee Ates, CMA   Called [facility] and informed the pharmacist that the Crestor was approved, Rx is know ready for pick up.  Caralee Ates, CMA   Called and informed the pt his Rx was approved and ready for pick up.  Caralee Ates, CMA

## 2011-08-13 ENCOUNTER — Telehealth: Payer: Self-pay | Admitting: Cardiology

## 2011-08-13 NOTE — Telephone Encounter (Signed)
New msg Pt was calling about authorization of carvedilol Please call him back

## 2011-08-24 MED ORDER — CARVEDILOL 25 MG PO TABS: 25.0000 mg | ORAL_TABLET | Freq: Two times a day (BID) | ORAL | Status: DC

## 2011-08-24 NOTE — Telephone Encounter (Signed)
I verified with Hector Brunswick at [facility] that they received the prescription and it has been approved

## 2011-08-24 NOTE — Telephone Encounter (Signed)
Pt requesting refill of coreg 25mg  twice a day.

## 2011-08-26 ENCOUNTER — Encounter: Payer: Self-pay | Admitting: Internal Medicine

## 2011-10-30 ENCOUNTER — Other Ambulatory Visit: Payer: Medicaid Other

## 2011-10-30 ENCOUNTER — Encounter: Payer: Medicaid Other | Admitting: Internal Medicine

## 2011-11-11 ENCOUNTER — Telehealth: Payer: Self-pay | Admitting: Internal Medicine

## 2011-11-11 NOTE — Telephone Encounter (Signed)
Pt wanted to let Thurston Hole know his disabilty went through

## 2011-11-11 NOTE — Telephone Encounter (Signed)
Pt wants lab work that is drawn on 3/11 faxed to Dr. Max Fickle at [facility] phone # [phone]  Fax# [phone]. Pt goes there for diabetes and they need his cholesterol and patient is trying to keep from getting stuck twice.

## 2011-11-11 NOTE — Telephone Encounter (Signed)
Will forward to Katina Dung for review tomorrow since she is not even here today.

## 2011-11-16 ENCOUNTER — Other Ambulatory Visit (INDEPENDENT_AMBULATORY_CARE_PROVIDER_SITE_OTHER): Payer: Medicare Other

## 2011-11-16 DIAGNOSIS — I2510 Atherosclerotic heart disease of native coronary artery without angina pectoris: Secondary | ICD-10-CM

## 2011-11-16 DIAGNOSIS — I5022 Chronic systolic (congestive) heart failure: Secondary | ICD-10-CM

## 2011-11-16 LAB — BASIC METABOLIC PANEL
BUN: 19 mg/dL (ref 6–23)
Calcium: 8.9 mg/dL (ref 8.4–10.5)
Creatinine, Ser: 0.8 mg/dL (ref 0.4–1.5)
GFR: 101.84 mL/min (ref >60.00)
Glucose, Bld: 234 mg/dL — ABNORMAL HIGH (ref 70–99)

## 2011-11-18 NOTE — Telephone Encounter (Signed)
This has been done and pt is aware.  

## 2011-12-03 ENCOUNTER — Encounter: Payer: Self-pay | Admitting: Internal Medicine

## 2011-12-03 ENCOUNTER — Ambulatory Visit (INDEPENDENT_AMBULATORY_CARE_PROVIDER_SITE_OTHER): Payer: Medicare Other | Admitting: Internal Medicine

## 2011-12-03 VITALS — BP 102/63 | HR 62 | Resp 18 | Ht 67.0 in | Wt 218.0 lb

## 2011-12-03 DIAGNOSIS — I428 Other cardiomyopathies: Secondary | ICD-10-CM

## 2011-12-03 DIAGNOSIS — I2589 Other forms of chronic ischemic heart disease: Secondary | ICD-10-CM

## 2011-12-03 DIAGNOSIS — I5022 Chronic systolic (congestive) heart failure: Secondary | ICD-10-CM

## 2011-12-03 LAB — ICD DEVICE OBSERVATION
AL AMPLITUDE: 3.125 mv
AL IMPEDENCE ICD: 532 Ohm
AL THRESHOLD: 0.75 V
ATRIAL PACING ICD: 19.87 pct
BAMS-0001: 170 {beats}/min
BATTERY VOLTAGE: 3.1781 V
DEV-0020ICD: NEGATIVE
FVT: 0
RV LEAD THRESHOLD: 0.625 V
TZAT-0001ATACH: 1
TZAT-0002ATACH: NEGATIVE
TZAT-0002ATACH: NEGATIVE
TZAT-0002ATACH: NEGATIVE
TZAT-0002SLOWVT: NEGATIVE
TZAT-0012FASTVT: 170 ms
TZAT-0012SLOWVT: 170 ms
TZAT-0018ATACH: NEGATIVE
TZAT-0018ATACH: NEGATIVE
TZAT-0018FASTVT: NEGATIVE
TZAT-0018SLOWVT: NEGATIVE
TZAT-0019ATACH: 6 V
TZAT-0019ATACH: 6 V
TZAT-0019ATACH: 6 V
TZAT-0019FASTVT: 8 V
TZAT-0019SLOWVT: 8 V
TZAT-0020ATACH: 1.5 ms
TZAT-0020ATACH: 1.5 ms
TZAT-0020FASTVT: 1.5 ms
TZAT-0020SLOWVT: 1.5 ms
TZON-0003SLOWVT: 360 ms
TZST-0001ATACH: 5
TZST-0001FASTVT: 2
TZST-0001FASTVT: 5
TZST-0001FASTVT: 6
TZST-0001SLOWVT: 5
TZST-0002ATACH: NEGATIVE
TZST-0002ATACH: NEGATIVE
TZST-0002FASTVT: NEGATIVE
TZST-0002FASTVT: NEGATIVE
TZST-0002FASTVT: NEGATIVE
TZST-0002SLOWVT: NEGATIVE
TZST-0002SLOWVT: NEGATIVE
TZST-0002SLOWVT: NEGATIVE
VENTRICULAR PACING ICD: 0.04 pct
VF: 0

## 2011-12-03 NOTE — Patient Instructions (Signed)
Your physician recommends that you schedule a follow-up appointment in: 3 months in device clinic and 12 months with Dr Allred  

## 2011-12-03 NOTE — Assessment & Plan Note (Signed)
Normal ICD function See Pace Art report No arrhythmias, optivol is not elevated, no significant V pacing No changes today  Device office checks every 3 months Follow-up with me in 1 year

## 2011-12-03 NOTE — Progress Notes (Signed)
Primary Cardiologist:  Dr Shirlee Latch  The patient presents today for routine electrophysiology followup.  Since last being seen in our clinic, the patient reports doing very well.  Today, he denies symptoms of palpitations, chest pain, shortness of breath, orthopnea, PND, lower extremity edema, dizziness, presyncope, syncope, or neurologic sequela.  The patient feels that he is tolerating medications without difficulties and is otherwise without complaint today.   Past Medical History  Diagnosis Date  . CAD (coronary artery disease)   . Diabetes mellitus   . Learning disability   . Hyperlipidemia   . Poor vision   . Myocardial infarct   . Depression    Past Surgical History  Procedure Date  . Pci lad 7/10  . Hand surgery   . Icd implant 07/18/10    MDT ICD implanted by Dr Johney Frame    Current Outpatient Prescriptions  Medication Sig Dispense Refill  . aspirin 81 MG tablet Take 81 mg by mouth daily.        . carvedilol (COREG) 25 MG tablet Take 1 tablet (25 mg total) by mouth 2 (two) times daily.  60 tablet  6  . clopidogrel (PLAVIX) 75 MG tablet Take 1 tablet (75 mg total) by mouth daily.  90 tablet  3  . enalapril (VASOTEC) 10 MG tablet 1 1/2 po bid  180 tablet  3  . furosemide (LASIX) 40 MG tablet Take 0.5 tablets (20 mg total) by mouth daily.  90 tablet  3  . glipiZIDE (GLUCOTROL) 10 MG tablet Take 10 mg by mouth 2 (two) times daily before a meal.        . metFORMIN (GLUCOPHAGE) 1000 MG tablet Take 1,000 mg by mouth 2 (two) times daily with a meal.        . nitroGLYCERIN (NITROSTAT) 0.4 MG SL tablet Place 0.4 mg under the tongue every 5 (five) minutes as needed.        . rosuvastatin (CRESTOR) 40 MG tablet Take 1 tablet (40 mg total) by mouth daily.  90 tablet  1  . sertraline (ZOLOFT) 100 MG tablet Take 200 mg by mouth daily.       Marland Kitchen spironolactone (ALDACTONE) 25 MG tablet Take 1 tablet (25 mg total) by mouth daily.  30 tablet  6  . Flaxseed, Linseed, (FLAXSEED OIL) 1200 MG CAPS Take  by mouth daily.         No Known Allergies  History   Social History  . Marital Status: Married    Spouse Name: N/A    Number of Children: N/A  . Years of Education: N/A   Occupational History  . Disabled     due to CAD   Social History Main Topics  . Smoking status: Never Smoker   . Smokeless tobacco: Never Used   Comment: former passive smoker  . Alcohol Use: No  . Drug Use: No  . Sexually Active: Not on file   Other Topics Concern  . Not on file   Social History Narrative  . No narrative on file    Family History  Problem Relation Age of Onset  . Diabetes Father   . Heart disease Mother   . Heart attack Brother 45  . Stomach cancer      uncle    ROS-  All systems are reviewed and are negative except as outlined in the HPI above   Physical Exam: Filed Vitals:   12/03/11 0920  BP: 102/63  Pulse: 62  Resp: 18  Height:  5\' 7"  (1.702 m)  Weight: 218 lb (98.884 kg)    GEN- The patient is well appearing, alert and oriented x 3 today.   Head- normocephalic, atraumatic Eyes-  Sclera clear, conjunctiva pink Ears- hearing intact Oropharynx- clear Neck- supple, no JVP Lymph- no cervical lymphadenopathy Lungs- Clear to ausculation bilaterally, normal work of breathing Chest- ICD pocket is well healed Heart- Regular rate and rhythm, no murmurs, rubs or gallops, PMI not laterally displaced GI- soft, NT, ND, + BS Extremities- no clubbing, cyanosis, or edema MS- no significant deformity or atrophy Skin- no rash or lesion Psych- euthymic mood, full affect Neuro- strength and sensation are intact  ICD interrogation- reviewed in detail today,  See PACEART report  Assessment and Plan:

## 2012-03-28 ENCOUNTER — Ambulatory Visit (INDEPENDENT_AMBULATORY_CARE_PROVIDER_SITE_OTHER): Payer: Medicare Other | Admitting: *Deleted

## 2012-03-28 ENCOUNTER — Encounter: Payer: Self-pay | Admitting: Internal Medicine

## 2012-03-28 DIAGNOSIS — I5022 Chronic systolic (congestive) heart failure: Secondary | ICD-10-CM

## 2012-03-28 DIAGNOSIS — I428 Other cardiomyopathies: Secondary | ICD-10-CM

## 2012-03-28 LAB — ICD DEVICE OBSERVATION
AL IMPEDENCE ICD: 532 Ohm
AL THRESHOLD: 1 V
BAMS-0001: 170 {beats}/min
DEV-0020ICD: NEGATIVE
RV LEAD IMPEDENCE ICD: 494 Ohm
RV LEAD THRESHOLD: 0.5 V
TZAT-0001ATACH: 1
TZAT-0001ATACH: 3
TZAT-0002ATACH: NEGATIVE
TZAT-0002ATACH: NEGATIVE
TZAT-0002SLOWVT: NEGATIVE
TZAT-0012ATACH: 150 ms
TZAT-0018ATACH: NEGATIVE
TZAT-0018ATACH: NEGATIVE
TZAT-0018SLOWVT: NEGATIVE
TZAT-0019ATACH: 6 V
TZAT-0019FASTVT: 8 V
TZAT-0019SLOWVT: 8 V
TZAT-0020FASTVT: 1.5 ms
TZAT-0020SLOWVT: 1.5 ms
TZON-0003SLOWVT: 360 ms
TZON-0004VSLOWVT: 32
TZON-0005SLOWVT: 12
TZST-0001ATACH: 5
TZST-0001ATACH: 6
TZST-0001FASTVT: 2
TZST-0001FASTVT: 3
TZST-0001FASTVT: 5
TZST-0001SLOWVT: 5
TZST-0001SLOWVT: 6
TZST-0002ATACH: NEGATIVE
TZST-0002ATACH: NEGATIVE
TZST-0002ATACH: NEGATIVE
TZST-0002FASTVT: NEGATIVE
TZST-0002FASTVT: NEGATIVE
TZST-0002FASTVT: NEGATIVE
TZST-0002SLOWVT: NEGATIVE
TZST-0002SLOWVT: NEGATIVE
TZST-0002SLOWVT: NEGATIVE

## 2012-03-28 NOTE — Progress Notes (Signed)
Pt seen in clinic for follow up of ICD.  No complaints of chest pain, shortness of breath, dizziness, palpitations, or shocks.  Device functioning normally at this time.  For full details, see PaceArt report.  No programming changes made today.  Pt interested in [place], enrolled.    Gypsy Balsam, RN, BSN 03/28/2012 8:30 AM

## 2012-03-31 ENCOUNTER — Other Ambulatory Visit: Payer: Self-pay | Admitting: *Deleted

## 2012-03-31 MED ORDER — CLOPIDOGREL BISULFATE 75 MG PO TABS: 75.0000 mg | ORAL_TABLET | Freq: Every day | ORAL | Status: DC

## 2012-04-01 ENCOUNTER — Other Ambulatory Visit: Payer: Self-pay

## 2012-04-01 MED ORDER — ENALAPRIL MALEATE 10 MG PO TABS: ORAL_TABLET | ORAL | Status: DC

## 2012-04-08 ENCOUNTER — Other Ambulatory Visit: Payer: Self-pay

## 2012-04-08 MED ORDER — ROSUVASTATIN CALCIUM 40 MG PO TABS: 40.0000 mg | ORAL_TABLET | Freq: Every day | ORAL | Status: DC

## 2012-04-08 MED ORDER — CARVEDILOL 25 MG PO TABS: 25.0000 mg | ORAL_TABLET | Freq: Two times a day (BID) | ORAL | Status: DC

## 2012-04-20 ENCOUNTER — Ambulatory Visit: Payer: Medicare Other | Admitting: Pulmonary Disease

## 2012-05-11 ENCOUNTER — Other Ambulatory Visit: Payer: Self-pay | Admitting: *Deleted

## 2012-05-11 MED ORDER — ROSUVASTATIN CALCIUM 40 MG PO TABS: 40.0000 mg | ORAL_TABLET | Freq: Every day | ORAL | Status: DC

## 2012-05-18 ENCOUNTER — Other Ambulatory Visit: Payer: Self-pay

## 2012-05-18 MED ORDER — ROSUVASTATIN CALCIUM 40 MG PO TABS: 40.0000 mg | ORAL_TABLET | Freq: Every day | ORAL | Status: DC

## 2012-05-23 ENCOUNTER — Encounter: Payer: Self-pay | Admitting: *Deleted

## 2012-05-27 NOTE — Telephone Encounter (Signed)
This encounter was created in error - please disregard.

## 2012-06-02 ENCOUNTER — Ambulatory Visit: Payer: Medicare Other | Admitting: Pulmonary Disease

## 2012-06-03 ENCOUNTER — Ambulatory Visit: Payer: Medicare Other | Admitting: Pulmonary Disease

## 2012-06-24 ENCOUNTER — Encounter: Payer: Self-pay | Admitting: Pulmonary Disease

## 2012-06-24 ENCOUNTER — Ambulatory Visit (INDEPENDENT_AMBULATORY_CARE_PROVIDER_SITE_OTHER): Payer: Medicare Other | Admitting: Pulmonary Disease

## 2012-06-24 VITALS — BP 104/70 | HR 68 | Temp 97.6°F | Ht 68.0 in | Wt 214.0 lb

## 2012-06-24 DIAGNOSIS — G4733 Obstructive sleep apnea (adult) (pediatric): Secondary | ICD-10-CM

## 2012-06-24 NOTE — Assessment & Plan Note (Signed)
The patient is doing very well from a sleep apnea standpoint, and has even lost significant weight since the last visit.  I have asked him to keep up with his mask changes and supplies, and to keep working on weight loss.  He will followup with me in one year is doing well.  I have counseled him on an antihistamine for his postnasal drip and nasal symptoms, and also instructed him on good sleep hygiene to help with some of his sleep issues.

## 2012-06-24 NOTE — Progress Notes (Signed)
  Subjective:    Patient ID: KODEE RAVERT, male    DOB: 02-23-1956, 56 y.o.   MRN: [id number]  HPI Patient comes in today for followup of his known obstructive sleep apnea.  He is wearing CPAP compliantly, and is having no issues with his mask fit or pressure.  He is having some humidity issues, and I have reviewed with him how to make adjustments to this.  He also describes some sleep hygiene issues with napping during the day and staying up late at night watching movies.  I have reviewed good sleep hygiene with him.  He has actually lost 11 pounds since last visit, and I have encouraged him to continue doing so.   Review of Systems  Constitutional: Negative for fever and unexpected weight change.  HENT: Positive for congestion, rhinorrhea and sinus pressure. Negative for ear pain, nosebleeds, sore throat, sneezing, trouble swallowing, dental problem and postnasal drip.   Eyes: Negative for redness and itching.  Respiratory: Positive for cough ( AM ONLY ) and shortness of breath. Negative for chest tightness and wheezing.   Cardiovascular: Negative for palpitations and leg swelling.  Gastrointestinal: Negative for nausea and vomiting.  Genitourinary: Negative for dysuria.  Musculoskeletal: Negative for joint swelling.  Skin: Negative for rash.  Neurological: Negative for headaches.  Hematological: Bruises/bleeds easily.  Psychiatric/Behavioral: Positive for dysphoric mood. The patient is nervous/anxious.        Objective:   Physical Exam Overweight male in no acute distress Nose without purulence or discharge noted No skin breakdown or pressure necrosis from the CPAP mask Lower extremities with mild edema, no cyanosis Alert and oriented, moves all 4 extremities.  Does not appear to be sleepy.       Assessment & Plan:

## 2012-06-24 NOTE — Patient Instructions (Addendum)
Stay on cpap, and keep up with supplies and mask changes Turn heat up on humidifier for increased moisture, and down for less moisture Can try zyrtec 10mg  over the counter and take at bedtime for a week or 2 to see if nasal symptoms improve. Continue working on weight loss.  You are doing well.  followup with me in a year if doing well.

## 2012-07-04 ENCOUNTER — Ambulatory Visit (INDEPENDENT_AMBULATORY_CARE_PROVIDER_SITE_OTHER): Payer: Medicare Other | Admitting: *Deleted

## 2012-07-04 ENCOUNTER — Encounter: Payer: Self-pay | Admitting: Internal Medicine

## 2012-07-04 DIAGNOSIS — I428 Other cardiomyopathies: Secondary | ICD-10-CM

## 2012-07-04 DIAGNOSIS — I5022 Chronic systolic (congestive) heart failure: Secondary | ICD-10-CM

## 2012-07-11 LAB — REMOTE ICD DEVICE
AL IMPEDENCE ICD: 532 Ohm
CHARGE TIME: 9.639 s
DEV-0020ICD: NEGATIVE
PACEART VT: 0
TOT-0001: 1
TOT-0002: 0
TOT-0006: 20111111000000
TZAT-0001ATACH: 2
TZAT-0001ATACH: 3
TZAT-0001FASTVT: 1
TZAT-0001SLOWVT: 1
TZAT-0002ATACH: NEGATIVE
TZAT-0002FASTVT: NEGATIVE
TZAT-0012ATACH: 150 ms
TZAT-0012ATACH: 150 ms
TZAT-0012ATACH: 150 ms
TZAT-0018ATACH: NEGATIVE
TZAT-0018SLOWVT: NEGATIVE
TZAT-0020ATACH: 1.5 ms
TZON-0003ATACH: 350 ms
TZON-0003VSLOWVT: 340 ms
TZON-0004SLOWVT: 16
TZON-0004VSLOWVT: 32
TZON-0005SLOWVT: 12
TZST-0001ATACH: 6
TZST-0001FASTVT: 3
TZST-0001FASTVT: 4
TZST-0001SLOWVT: 2
TZST-0001SLOWVT: 4
TZST-0001SLOWVT: 6
TZST-0002ATACH: NEGATIVE
TZST-0002FASTVT: NEGATIVE
TZST-0002FASTVT: NEGATIVE
TZST-0002FASTVT: NEGATIVE
TZST-0002SLOWVT: NEGATIVE
TZST-0002SLOWVT: NEGATIVE
VENTRICULAR PACING ICD: 0.04 pct

## 2012-08-02 ENCOUNTER — Encounter: Payer: Self-pay | Admitting: *Deleted

## 2012-08-10 ENCOUNTER — Other Ambulatory Visit: Payer: Self-pay | Admitting: *Deleted

## 2012-08-10 MED ORDER — FUROSEMIDE 40 MG PO TABS: 20.0000 mg | ORAL_TABLET | Freq: Every day | ORAL | Status: DC

## 2012-09-13 ENCOUNTER — Other Ambulatory Visit: Payer: Self-pay | Admitting: *Deleted

## 2012-09-13 ENCOUNTER — Other Ambulatory Visit: Payer: Self-pay | Admitting: Internal Medicine

## 2012-09-13 DIAGNOSIS — I2510 Atherosclerotic heart disease of native coronary artery without angina pectoris: Secondary | ICD-10-CM

## 2012-09-13 DIAGNOSIS — I5022 Chronic systolic (congestive) heart failure: Secondary | ICD-10-CM

## 2012-09-13 MED ORDER — SPIRONOLACTONE 25 MG PO TABS: 25.0000 mg | ORAL_TABLET | Freq: Every day | ORAL | Status: DC

## 2012-09-13 MED ORDER — ROSUVASTATIN CALCIUM 40 MG PO TABS: 40.0000 mg | ORAL_TABLET | Freq: Every day | ORAL | Status: DC

## 2012-09-20 ENCOUNTER — Telehealth: Payer: Self-pay | Admitting: Pulmonary Disease

## 2012-09-20 DIAGNOSIS — G4733 Obstructive sleep apnea (adult) (pediatric): Secondary | ICD-10-CM

## 2012-09-20 NOTE — Telephone Encounter (Signed)
I spoke with the pt and he states he feels like his CPAP is giving him too much air. He states when he firsts puts it on the pressure is ok, but during his sleep he is woken up by the pressure. He states it is too much air blowing in his face. He states it feels like it " over takes him." He states it wakes him up and keeps him up a lot during the night. He also states he feels like it is giving him a sinus headache as well. Pt is unsure of the setting, last thing I can see in the chart is he was on auto setting. Please advise. Carron Curie, CMA

## 2012-09-20 NOTE — Telephone Encounter (Signed)
I would like to have his dme get a download off his machine for last 3mos so I can see if there is anything going on. Have them fax to you asap and give to me for review once available.

## 2012-09-20 NOTE — Telephone Encounter (Signed)
Pt notified that order was sent to [facility] for cpap download for past 3 months.  Hold message until download is received so we can f/u with pt.

## 2012-09-26 ENCOUNTER — Encounter: Payer: Self-pay | Admitting: Pulmonary Disease

## 2012-09-26 NOTE — Telephone Encounter (Addendum)
Called, spoke with pt.  Informed him of below per [facility].  He verbalized understanding of this and would like to try having pressure decreased to 10.  He is aware order will be sent and DME will contact him to change this setting.  Advised to pls call back if he has problems on new pressure.  He verbalized understanding and voiced no further questions or concerns at this time.  Will sign off and route to [facility] as FYI.

## 2012-09-26 NOTE — Telephone Encounter (Signed)
Let the pt know that his machine is currently on 12, and his apnea is well controlled on this.  We can turn the pressure down to 10 and see if he does better.  If pt ok with this, please send order to [facility].

## 2012-09-26 NOTE — Telephone Encounter (Signed)
D/L has been received and placed in [facility] folder for review.

## 2012-10-11 ENCOUNTER — Telehealth: Payer: Self-pay | Admitting: Pulmonary Disease

## 2012-10-11 DIAGNOSIS — G4733 Obstructive sleep apnea (adult) (pediatric): Secondary | ICD-10-CM

## 2012-10-11 NOTE — Telephone Encounter (Signed)
Ok to turn down to 8 or 9, but make sure he understands may have some breakthru apnea.   Start with 8 and see how he does.

## 2012-10-11 NOTE — Telephone Encounter (Signed)
Patient had CPAP machine adjusted from 12-->10 on 09/26/12. Spoke with patient, patient states he feels this pressure is still too high. Would like it readjusted. Dr. Shelle Iron please advise, thank you

## 2012-10-11 NOTE — Telephone Encounter (Signed)
Order faxed to [facility]. Ollen Gross

## 2012-10-11 NOTE — Telephone Encounter (Signed)
Patient needs CPAP pressure adjusted down to 8. Per [facility] start at 8 and see if tolerable, may increase to 9 if need be. Thanks. DME [facility].  Message being routed to [facility].

## 2012-10-11 NOTE — Telephone Encounter (Signed)
Patient aware of order being placed. Will call if anything further needed. Aware that DME will contact him.

## 2012-11-21 ENCOUNTER — Other Ambulatory Visit: Payer: Self-pay | Admitting: Emergency Medicine

## 2012-11-21 MED ORDER — CARVEDILOL 25 MG PO TABS: 25.0000 mg | ORAL_TABLET | Freq: Two times a day (BID) | ORAL | Status: DC

## 2012-12-02 ENCOUNTER — Telehealth: Payer: Self-pay | Admitting: Cardiology

## 2012-12-02 NOTE — Telephone Encounter (Signed)
Follow up   Pt is returning Anne's phone call.

## 2012-12-02 NOTE — Telephone Encounter (Signed)
LMTCB

## 2012-12-02 NOTE — Telephone Encounter (Signed)
New Prob   Pt requesting original medication list prescribed by cardiac providers mailed to his house. Would like to speak to nurse.

## 2012-12-02 NOTE — Telephone Encounter (Signed)
Pt called to have the list of medication.  He is taken so pt  can get monetary help from "my applicant" an assisted medication program. The last pt's OV was 12/03/2011 with Dr. Johney Frame and 08/13/2011 with Dr. Shirlee Latch. Pt has an appointment on 12/07/12 with Dr. Johney Frame and Shirlee Latch. Pt is aware that is better to have the more recent medication list, he will wait to have the medication list then.

## 2012-12-07 ENCOUNTER — Encounter: Payer: Self-pay | Admitting: Internal Medicine

## 2012-12-07 ENCOUNTER — Encounter: Payer: Self-pay | Admitting: Cardiology

## 2012-12-07 ENCOUNTER — Ambulatory Visit (INDEPENDENT_AMBULATORY_CARE_PROVIDER_SITE_OTHER): Payer: Medicare Other | Admitting: Internal Medicine

## 2012-12-07 ENCOUNTER — Ambulatory Visit (INDEPENDENT_AMBULATORY_CARE_PROVIDER_SITE_OTHER): Payer: Medicare Other | Admitting: Cardiology

## 2012-12-07 VITALS — BP 132/84 | HR 63 | Ht 68.0 in | Wt 217.0 lb

## 2012-12-07 DIAGNOSIS — I5022 Chronic systolic (congestive) heart failure: Secondary | ICD-10-CM

## 2012-12-07 DIAGNOSIS — I2510 Atherosclerotic heart disease of native coronary artery without angina pectoris: Secondary | ICD-10-CM

## 2012-12-07 DIAGNOSIS — I509 Heart failure, unspecified: Secondary | ICD-10-CM

## 2012-12-07 DIAGNOSIS — E785 Hyperlipidemia, unspecified: Secondary | ICD-10-CM

## 2012-12-07 DIAGNOSIS — I428 Other cardiomyopathies: Secondary | ICD-10-CM

## 2012-12-07 DIAGNOSIS — I219 Acute myocardial infarction, unspecified: Secondary | ICD-10-CM

## 2012-12-07 LAB — ICD DEVICE OBSERVATION
AL THRESHOLD: 0.75 V
BAMS-0001: 170 {beats}/min
BATTERY VOLTAGE: 3.1236 V
DEV-0020ICD: NEGATIVE
PACEART VT: 0
RV LEAD THRESHOLD: 0.5 V
TZAT-0001ATACH: 1
TZAT-0001ATACH: 3
TZAT-0002ATACH: NEGATIVE
TZAT-0002ATACH: NEGATIVE
TZAT-0002SLOWVT: NEGATIVE
TZAT-0012ATACH: 150 ms
TZAT-0018ATACH: NEGATIVE
TZAT-0018ATACH: NEGATIVE
TZAT-0018SLOWVT: NEGATIVE
TZAT-0019ATACH: 6 V
TZAT-0019FASTVT: 8 V
TZAT-0019SLOWVT: 8 V
TZAT-0020ATACH: 1.5 ms
TZAT-0020FASTVT: 1.5 ms
TZON-0004VSLOWVT: 32
TZON-0005SLOWVT: 12
TZST-0001ATACH: 4
TZST-0001ATACH: 5
TZST-0001ATACH: 6
TZST-0001FASTVT: 3
TZST-0001FASTVT: 5
TZST-0001SLOWVT: 5
TZST-0001SLOWVT: 6
TZST-0002ATACH: NEGATIVE
TZST-0002ATACH: NEGATIVE
TZST-0002FASTVT: NEGATIVE
TZST-0002FASTVT: NEGATIVE
TZST-0002SLOWVT: NEGATIVE
TZST-0002SLOWVT: NEGATIVE
TZST-0002SLOWVT: NEGATIVE
TZST-0002SLOWVT: NEGATIVE
VENTRICULAR PACING ICD: 0.04 pct
VF: 0

## 2012-12-07 MED ORDER — ENALAPRIL MALEATE 20 MG PO TABS: 20.0000 mg | ORAL_TABLET | Freq: Two times a day (BID) | ORAL | Status: DC

## 2012-12-07 NOTE — Patient Instructions (Addendum)
Increase enalapril to 20mg  two times a day.   Your physician recommends that you return for lab work in: 2 weeks- BMET   Your physician has requested that you have an echocardiogram. Echocardiography is a painless test that uses sound waves to create images of your heart. It provides your doctor with information about the size and shape of your heart and how well your heart's chambers and valves are working. This procedure takes approximately one hour. There are no restrictions for this procedure. In 2 weeks when you have your lab.  Your physician recommends that you schedule a follow-up appointment in: 4 months with Dr Shirlee Latch  . Your physician recommends that you return for lab work in: 4 months when you see Dr Frutoso Chase.

## 2012-12-07 NOTE — Progress Notes (Signed)
Primary Cardiologist:  Dr Shirlee Latch  The patient presents today for routine electrophysiology followup.  Since last being seen in our clinic, the patient reports doing well.  He denies any significant decline since his last visit.  He has "good days and bad days".   Today, he denies symptoms of palpitations, chest pain, shortness of breath, orthopnea, PND, lower extremity edema, dizziness, presyncope, syncope, or neurologic sequela.  The patient feels that he is tolerating medications without difficulties and is otherwise without complaint today.   Past Medical History  Diagnosis Date  . CAD (coronary artery disease)   . Diabetes mellitus   . Learning disability   . Hyperlipidemia   . Poor vision   . Myocardial infarct   . Depression    Past Surgical History  Procedure Laterality Date  . Pci lad  7/10  . Hand surgery    . Icd implant  07/18/10    MDT ICD implanted by Dr Johney Frame    Current Outpatient Prescriptions  Medication Sig Dispense Refill  . aspirin 81 MG tablet Take 81 mg by mouth daily.        . carvedilol (COREG) 25 MG tablet Take 1 tablet (25 mg total) by mouth 2 (two) times daily.  60 tablet  6  . clopidogrel (PLAVIX) 75 MG tablet Take 1 tablet (75 mg total) by mouth daily.  90 tablet  3  . enalapril (VASOTEC) 10 MG tablet 1 1/2 po bid  180 tablet  3  . furosemide (LASIX) 40 MG tablet Take 0.5 tablets (20 mg total) by mouth daily.  90 tablet  1  . glipiZIDE (GLUCOTROL) 10 MG tablet Take 10 mg by mouth 2 (two) times daily before a meal.        . insulin glargine (LANTUS) 100 UNIT/ML injection Inject into the skin at bedtime. Use as directed      . metFORMIN (GLUCOPHAGE) 1000 MG tablet Take 1,000 mg by mouth 2 (two) times daily with a meal.        . nitroGLYCERIN (NITROSTAT) 0.4 MG SL tablet Place 0.4 mg under the tongue every 5 (five) minutes as needed.        . rosuvastatin (CRESTOR) 40 MG tablet Take 1 tablet (40 mg total) by mouth daily.  30 tablet  0  . sertraline (ZOLOFT)  100 MG tablet Take 200 mg by mouth daily.       Marland Kitchen spironolactone (ALDACTONE) 25 MG tablet Take 1 tablet (25 mg total) by mouth daily.  30 tablet  6   No current facility-administered medications for this visit.    No Known Allergies  History   Social History  . Marital Status: Married    Spouse Name: N/A    Number of Children: N/A  . Years of Education: N/A   Occupational History  . Disabled     due to CAD   Social History Main Topics  . Smoking status: Never Smoker   . Smokeless tobacco: Never Used     Comment: former passive smoker  . Alcohol Use: No  . Drug Use: No  . Sexually Active: Not on file   Other Topics Concern  . Not on file   Social History Narrative  . No narrative on file    Family History  Problem Relation Age of Onset  . Diabetes Father   . Heart disease Mother   . Heart attack Brother 45  . Stomach cancer      uncle  Physical Exam: There  were no vitals filed for this visit.  GEN- The patient is well appearing, alert and oriented x 3 today.   Head- normocephalic, atraumatic Eyes-  Sclera clear, conjunctiva pink Ears- hearing intact Oropharynx- clear Neck- supple, no JVP Lymph- no cervical lymphadenopathy Lungs- Clear to ausculation bilaterally, normal work of breathing Chest- ICD pocket is well healed Heart- Regular rate and rhythm, no murmurs, rubs or gallops, PMI not laterally displaced GI- soft, NT, ND, + BS Extremities- no clubbing, cyanosis, or edema Neuro- strength and sensation are intact  ICD interrogation- reviewed in detail today,  See PACEART report  Assessment and Plan:  1. Ischemic CM/ chronic systolic dysfunction Normal ICD function See Pace Art report NIDs increased today to minimize risk of shock carelink every 3 months Return in 1 year

## 2012-12-07 NOTE — Progress Notes (Signed)
Patient ID: Zachary Berry, male   DOB: [birth year], 57 y.o.   MRN: [id number] PCP: In [place], does not remember name  57 yo with history of CAD and ischemic CMP returns for followup.  I have not seen him for over a year. No significant chest pain. He still fatigues easily.  He can walk about 100 yards before getting short of breath.  He occasionally walks at [facility] for exercise.  He walks on a treadmill for exercise on most days.  He is short of breath with stairs.  No orthopnea or PND.  Symptoms have been stable for several years.  Weight is stable compared to last appointment.  He saw Dr. Johney Frame earlier today for ICD followup.   Las (10/11): LDL 32, HDL 42  Labs (11/11): K 4.4, creatinine 0.8  Labs (1/12): K 4.8, creatinine 0.7, BNP 40 Labs (2/14): K 4.5, creatinine 0.77, LDL 15, HDL 23  Allergies (verified):  No Known Drug Allergies   Past Medical History:  1. CAD: Anterior STEMI 04/03/09. 3.0 x 15 mm Xience DES to totally occluded proximal LAD. No obstructive disease elsewhere.  2. Ischemic CMP: Echo (04/04/09) with EF 25-30%, anteroseptal, anterior, anterolateral, and apical akinesis. Followup echo (9/10) with EF 35-40%, apical and anteroseptal hypokinesis, mild MR, grade II diastolic dysfunction. Cardiac MRI (10/11): EF 34%, apical anterior, apical septal, and true apex full thickness scar, basal to mid anterior wall with 2/3 thickness scar. Medtronic dual chamber ICD with Optivol was placed 11/11.  3. Diabetes mellitus  4. Learning disability  5. Hyperlipidemia: myalgias with Lipitor  6. Depression: Seeing a psychiatrist in [place]  7. OSA on CPAP 8. Very poor vision   Family History:  Father died with a history of diabetes,  but no history of coronary artery disease.  Mother had some heart problems-unspecified  Brother died of an MI at age 40.  Another brother had CABG at age 74  stomach cancer--uncle   Social History:  Tobacco Use - No.  Alcohol Use - no  Drug Use -  no  Lives in [place]. Disabled due CAD. 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  Review of Systems  All systems reviewed and negative except as per HPI.   Current Outpatient Prescriptions  Medication Sig Dispense Refill  . aspirin 81 MG tablet Take 81 mg by mouth daily.        . carvedilol (COREG) 25 MG tablet Take 1 tablet (25 mg total) by mouth 2 (two) times daily.  60 tablet  6  . clopidogrel (PLAVIX) 75 MG tablet Take 1 tablet (75 mg total) by mouth daily.  90 tablet  3  . enalapril (VASOTEC) 10 MG tablet 1 1/2 po bid  180 tablet  3  . furosemide (LASIX) 40 MG tablet Take 0.5 tablets (20 mg total) by mouth daily.  90 tablet  1  . glipiZIDE (GLUCOTROL) 10 MG tablet Take 10 mg by mouth 2 (two) times daily before a meal.        . insulin glargine (LANTUS) 100 UNIT/ML injection Inject into the skin at bedtime. Use as directed      . metFORMIN (GLUCOPHAGE) 1000 MG tablet Take 1,000 mg by mouth 2 (two) times daily with a meal.        . nitroGLYCERIN (NITROSTAT) 0.4 MG SL tablet Place 0.4 mg under the tongue every 5 (five) minutes as needed.        Marland Kitchen  rosuvastatin (CRESTOR) 40 MG tablet Take 1 tablet (40 mg total) by mouth daily.  30 tablet  0  . sertraline (ZOLOFT) 100 MG tablet Take 200 mg by mouth daily.       Marland Kitchen spironolactone (ALDACTONE) 25 MG tablet Take 1 tablet (25 mg total) by mouth daily.  30 tablet  6  . enalapril (VASOTEC) 20 MG tablet Take 1 tablet (20 mg total) by mouth 2 (two) times daily.  60 tablet  3   No current facility-administered medications for this visit.    BP 132/84  Pulse 63  Ht 5\' 8"  (1.727 m)  Wt 217 lb (98.431 kg)  BMI 33 kg/m2  SpO2 97% General: NAD Neck: Thick, no JVD, no thyromegaly or thyroid nodule.  Lungs: Clear to auscultation bilaterally with normal respiratory effort. CV: Nondisplaced PMI.  Heart regular S1/S2, no S3/S4, no murmur.  No peripheral edema.  No carotid bruit.  Normal pedal  pulses.  Abdomen: Soft, nontender, no hepatosplenomegaly, no distention.  Neurologic: Alert and oriented x 3.  Psych: Normal affect. Extremities: No clubbing or cyanosis.   Assessment/Plan: 1. CAD: Stable with no chest pain.  Continue ASA 81, statin, Coreg, ACEI, Plavix.  2. Chronic systolic CHF: NYHA class II symptoms.  He is not volume overloaded.  He is on the same medications as when I saw him over a year ago.  He seems to be tolerating them well.  - Increase enalapril to 20 mg bid with BMET in 2 wks.  - Continue Coreg, Lasix, and spironolactone. I had a long talk with him today about the need for regular followup and labwork.  He will have blood drawn at least every 4 months for BMET to follow K and creatinine with spironolactone use.   - Echo to reassess LV systolic function.  - Followup in 4 months.  3. Hyperlipidemia: Continue statin with known CAD.  Good lipids in 2/14.   Marca Ancona 12/07/2012

## 2012-12-07 NOTE — Patient Instructions (Signed)
Your physician wants you to follow-up in: One  (1) year.  You will receive a reminder letter in the mail two months in advance. If you don't receive a letter, please call our office to schedule the follow-up appointment.   Remote monitoring is used to monitor your Pacemaker of ICD from home. This monitoring reduces the number of office visits required to check your device to one time per year. It allows [place] to keep an eye on the functioning of your device to ensure it is working properly. You are scheduled for a device check from home on 03/13/13. You may send your transmission at any time that day. If you have a wireless device, the transmission will be sent automatically. After your physician reviews your transmission, you will receive a postcard with your next transmission date.

## 2012-12-21 ENCOUNTER — Other Ambulatory Visit (INDEPENDENT_AMBULATORY_CARE_PROVIDER_SITE_OTHER): Payer: Medicare Other

## 2012-12-21 ENCOUNTER — Ambulatory Visit (HOSPITAL_COMMUNITY): Payer: Medicare Other | Attending: Cardiology | Admitting: Radiology

## 2012-12-21 DIAGNOSIS — I5022 Chronic systolic (congestive) heart failure: Secondary | ICD-10-CM | POA: Insufficient documentation

## 2012-12-21 DIAGNOSIS — I2510 Atherosclerotic heart disease of native coronary artery without angina pectoris: Secondary | ICD-10-CM

## 2012-12-21 DIAGNOSIS — I509 Heart failure, unspecified: Secondary | ICD-10-CM | POA: Insufficient documentation

## 2012-12-21 LAB — BASIC METABOLIC PANEL WITH GFR
BUN: 18 mg/dL (ref 6–23)
CO2: 23 meq/L (ref 19–32)
Calcium: 9.2 mg/dL (ref 8.4–10.5)
Chloride: 98 meq/L (ref 96–112)
Creatinine, Ser: 0.8 mg/dL (ref 0.4–1.5)
GFR: 105.84 mL/min
Glucose, Bld: 282 mg/dL — ABNORMAL HIGH (ref 70–99)
Potassium: 4.4 meq/L (ref 3.5–5.1)
Sodium: 132 meq/L — ABNORMAL LOW (ref 135–145)

## 2012-12-21 NOTE — Progress Notes (Signed)
Echocardiogram performed.  

## 2012-12-26 ENCOUNTER — Telehealth: Payer: Self-pay | Admitting: Internal Medicine

## 2012-12-26 NOTE — Telephone Encounter (Signed)
Will refax Atn Dr Shirlee Latch

## 2012-12-26 NOTE — Telephone Encounter (Signed)
New problem   Zachary Berry/[employer] calling to verify a fax that was sent over for medication review. Please call.

## 2013-02-01 ENCOUNTER — Telehealth: Payer: Self-pay | Admitting: Cardiology

## 2013-02-01 NOTE — Telephone Encounter (Signed)
LM on VM [phone], [phone]) confirming fax receipt

## 2013-02-01 NOTE — Telephone Encounter (Signed)
Follow Up     Following up on a fax for member medication review. Wants to confirm fax was received. Please call.

## 2013-03-13 ENCOUNTER — Ambulatory Visit (INDEPENDENT_AMBULATORY_CARE_PROVIDER_SITE_OTHER): Payer: Medicare Other | Admitting: *Deleted

## 2013-03-13 ENCOUNTER — Encounter: Payer: Self-pay | Admitting: Internal Medicine

## 2013-03-13 DIAGNOSIS — Z95810 Presence of automatic (implantable) cardiac defibrillator: Secondary | ICD-10-CM

## 2013-03-13 DIAGNOSIS — I428 Other cardiomyopathies: Secondary | ICD-10-CM

## 2013-03-13 DIAGNOSIS — I509 Heart failure, unspecified: Secondary | ICD-10-CM

## 2013-03-15 LAB — REMOTE ICD DEVICE
AL AMPLITUDE: 2.9 mv
AL IMPEDENCE ICD: 532 Ohm
ATRIAL PACING ICD: 39.49 pct
CHARGE TIME: 10.189 s
RV LEAD IMPEDENCE ICD: 513 Ohm
RV LEAD THRESHOLD: 0.625 V
TOT-0002: 0
TOT-0006: 20111111000000
TZAT-0001ATACH: 1
TZAT-0001ATACH: 2
TZAT-0001FASTVT: 1
TZAT-0001SLOWVT: 1
TZAT-0002ATACH: NEGATIVE
TZAT-0002ATACH: NEGATIVE
TZAT-0002ATACH: NEGATIVE
TZAT-0002FASTVT: NEGATIVE
TZAT-0002SLOWVT: NEGATIVE
TZAT-0012FASTVT: 170 ms
TZAT-0018ATACH: NEGATIVE
TZAT-0018ATACH: NEGATIVE
TZAT-0018ATACH: NEGATIVE
TZAT-0019ATACH: 6 V
TZAT-0019ATACH: 6 V
TZAT-0019SLOWVT: 8 V
TZAT-0020ATACH: 1.5 ms
TZAT-0020FASTVT: 1.5 ms
TZON-0004VSLOWVT: 32
TZON-0005SLOWVT: 12
TZST-0001FASTVT: 3
TZST-0001FASTVT: 4
TZST-0001FASTVT: 5
TZST-0001SLOWVT: 3
TZST-0001SLOWVT: 5
TZST-0002ATACH: NEGATIVE
TZST-0002FASTVT: NEGATIVE
TZST-0002FASTVT: NEGATIVE
TZST-0002FASTVT: NEGATIVE
TZST-0002SLOWVT: NEGATIVE
TZST-0002SLOWVT: NEGATIVE
TZST-0002SLOWVT: NEGATIVE
TZST-0002SLOWVT: NEGATIVE
VF: 0

## 2013-03-24 ENCOUNTER — Encounter: Payer: Self-pay | Admitting: *Deleted

## 2013-04-18 ENCOUNTER — Other Ambulatory Visit: Payer: Self-pay

## 2013-04-18 MED ORDER — CLOPIDOGREL BISULFATE 75 MG PO TABS: 75.0000 mg | ORAL_TABLET | Freq: Every day | ORAL | Status: AC

## 2013-05-10 ENCOUNTER — Other Ambulatory Visit: Payer: Self-pay

## 2013-05-10 MED ORDER — ENALAPRIL MALEATE 20 MG PO TABS: 20.0000 mg | ORAL_TABLET | Freq: Two times a day (BID) | ORAL | Status: DC

## 2013-05-12 ENCOUNTER — Other Ambulatory Visit: Payer: Self-pay

## 2013-05-12 DIAGNOSIS — I5022 Chronic systolic (congestive) heart failure: Secondary | ICD-10-CM

## 2013-05-12 DIAGNOSIS — I2510 Atherosclerotic heart disease of native coronary artery without angina pectoris: Secondary | ICD-10-CM

## 2013-05-12 MED ORDER — SPIRONOLACTONE 25 MG PO TABS: 25.0000 mg | ORAL_TABLET | Freq: Every day | ORAL | Status: DC

## 2013-06-13 ENCOUNTER — Other Ambulatory Visit: Payer: Self-pay | Admitting: Cardiology

## 2013-06-19 ENCOUNTER — Encounter: Payer: Medicare Other | Admitting: *Deleted

## 2013-07-03 ENCOUNTER — Encounter: Payer: Self-pay | Admitting: *Deleted

## 2013-07-10 ENCOUNTER — Ambulatory Visit (INDEPENDENT_AMBULATORY_CARE_PROVIDER_SITE_OTHER): Payer: Medicare Other | Admitting: *Deleted

## 2013-07-10 DIAGNOSIS — I509 Heart failure, unspecified: Secondary | ICD-10-CM

## 2013-07-10 DIAGNOSIS — I428 Other cardiomyopathies: Secondary | ICD-10-CM

## 2013-07-11 ENCOUNTER — Other Ambulatory Visit: Payer: Self-pay

## 2013-07-11 LAB — REMOTE ICD DEVICE
AL AMPLITUDE: 3.4 mv
ATRIAL PACING ICD: 36.07 pct
BAMS-0001: 170 {beats}/min
DEV-0020ICD: NEGATIVE
FVT: 0
PACEART VT: 0
RV LEAD IMPEDENCE ICD: 513 Ohm
TOT-0001: 1
TOT-0002: 0
TZAT-0001ATACH: 2
TZAT-0001ATACH: 3
TZAT-0001FASTVT: 1
TZAT-0001SLOWVT: 1
TZAT-0002FASTVT: NEGATIVE
TZAT-0012ATACH: 150 ms
TZAT-0012SLOWVT: 170 ms
TZAT-0019ATACH: 6 V
TZAT-0020ATACH: 1.5 ms
TZAT-0020ATACH: 1.5 ms
TZAT-0020ATACH: 1.5 ms
TZAT-0020SLOWVT: 1.5 ms
TZON-0003SLOWVT: 360 ms
TZON-0003VSLOWVT: 340 ms
TZON-0004SLOWVT: 32
TZON-0004VSLOWVT: 32
TZON-0005SLOWVT: 12
TZST-0001ATACH: 6
TZST-0001FASTVT: 2
TZST-0001FASTVT: 3
TZST-0001FASTVT: 4
TZST-0001SLOWVT: 2
TZST-0001SLOWVT: 3
TZST-0002ATACH: NEGATIVE
TZST-0002FASTVT: NEGATIVE
TZST-0002FASTVT: NEGATIVE
TZST-0002FASTVT: NEGATIVE
TZST-0002SLOWVT: NEGATIVE
TZST-0002SLOWVT: NEGATIVE
TZST-0002SLOWVT: NEGATIVE

## 2013-07-11 MED ORDER — CARVEDILOL 25 MG PO TABS: 25.0000 mg | ORAL_TABLET | Freq: Two times a day (BID) | ORAL | Status: DC

## 2013-07-17 ENCOUNTER — Encounter: Payer: Self-pay | Admitting: *Deleted

## 2013-07-20 ENCOUNTER — Encounter: Payer: Self-pay | Admitting: Internal Medicine

## 2013-09-09 ENCOUNTER — Other Ambulatory Visit: Payer: Self-pay | Admitting: Cardiology

## 2013-09-13 ENCOUNTER — Other Ambulatory Visit: Payer: Self-pay

## 2013-09-13 MED ORDER — ENALAPRIL MALEATE 20 MG PO TABS: 20.0000 mg | ORAL_TABLET | Freq: Two times a day (BID) | ORAL | Status: DC

## 2013-09-15 ENCOUNTER — Other Ambulatory Visit: Payer: Self-pay | Admitting: *Deleted

## 2013-10-11 ENCOUNTER — Ambulatory Visit (INDEPENDENT_AMBULATORY_CARE_PROVIDER_SITE_OTHER): Payer: Commercial Managed Care - HMO | Admitting: *Deleted

## 2013-10-11 ENCOUNTER — Telehealth: Payer: Self-pay | Admitting: Internal Medicine

## 2013-10-11 DIAGNOSIS — I509 Heart failure, unspecified: Secondary | ICD-10-CM

## 2013-10-11 DIAGNOSIS — I428 Other cardiomyopathies: Secondary | ICD-10-CM

## 2013-10-11 NOTE — Telephone Encounter (Signed)
New message        Wants to know if transmission was received

## 2013-10-11 NOTE — Telephone Encounter (Signed)
Transmission was received/kwm  

## 2013-10-17 LAB — MDC_IDC_ENUM_SESS_TYPE_REMOTE
Brady Statistic AP VS Percent: 46.7 %
Brady Statistic AS VS Percent: 53.3 %
Lead Channel Impedance Value: 494 Ohm
Lead Channel Impedance Value: 513 Ohm
Lead Channel Pacing Threshold Amplitude: 0.5 V
Lead Channel Pacing Threshold Amplitude: 0.75 V
Lead Channel Pacing Threshold Pulse Width: 0.4 ms
Lead Channel Pacing Threshold Pulse Width: 0.4 ms
Lead Channel Sensing Intrinsic Amplitude: 17.1 mV
Lead Channel Sensing Intrinsic Amplitude: 3.4 mV
Lead Channel Setting Pacing Amplitude: 2 V
Lead Channel Setting Pacing Amplitude: 2.5 V
Lead Channel Setting Pacing Pulse Width: 0.4 ms
Lead Channel Setting Sensing Sensitivity: 0.3 mV
MDC IDC STAT BRADY AP VP PERCENT: 0.1 %
MDC IDC STAT BRADY AS VP PERCENT: 0.1 %
Zone Setting Detection Interval: 300 ms
Zone Setting Detection Interval: 340 ms
Zone Setting Detection Interval: 350 ms
Zone Setting Detection Interval: 360 ms

## 2013-10-25 ENCOUNTER — Encounter: Payer: Self-pay | Admitting: *Deleted

## 2013-10-27 ENCOUNTER — Encounter: Payer: Self-pay | Admitting: Internal Medicine

## 2013-12-21 ENCOUNTER — Encounter: Payer: Self-pay | Admitting: *Deleted

## 2013-12-22 ENCOUNTER — Other Ambulatory Visit: Payer: Self-pay

## 2013-12-22 MED ORDER — FUROSEMIDE 40 MG PO TABS: 20.0000 mg | ORAL_TABLET | Freq: Every day | ORAL | Status: DC

## 2014-02-15 ENCOUNTER — Encounter: Payer: Self-pay | Admitting: *Deleted

## 2014-03-26 ENCOUNTER — Ambulatory Visit (INDEPENDENT_AMBULATORY_CARE_PROVIDER_SITE_OTHER): Payer: Commercial Managed Care - HMO | Admitting: Physician Assistant

## 2014-03-26 ENCOUNTER — Telehealth: Payer: Self-pay | Admitting: *Deleted

## 2014-03-26 ENCOUNTER — Encounter: Payer: Self-pay | Admitting: Physician Assistant

## 2014-03-26 ENCOUNTER — Ambulatory Visit (INDEPENDENT_AMBULATORY_CARE_PROVIDER_SITE_OTHER): Payer: Commercial Managed Care - HMO | Admitting: Internal Medicine

## 2014-03-26 ENCOUNTER — Encounter: Payer: Self-pay | Admitting: Internal Medicine

## 2014-03-26 VITALS — BP 130/90 | HR 60 | Ht 68.0 in | Wt 194.0 lb

## 2014-03-26 DIAGNOSIS — I5022 Chronic systolic (congestive) heart failure: Secondary | ICD-10-CM

## 2014-03-26 DIAGNOSIS — I2510 Atherosclerotic heart disease of native coronary artery without angina pectoris: Secondary | ICD-10-CM | POA: Diagnosis not present

## 2014-03-26 DIAGNOSIS — I2589 Other forms of chronic ischemic heart disease: Secondary | ICD-10-CM | POA: Diagnosis not present

## 2014-03-26 DIAGNOSIS — E785 Hyperlipidemia, unspecified: Secondary | ICD-10-CM

## 2014-03-26 DIAGNOSIS — Z95810 Presence of automatic (implantable) cardiac defibrillator: Secondary | ICD-10-CM

## 2014-03-26 DIAGNOSIS — I10 Essential (primary) hypertension: Secondary | ICD-10-CM

## 2014-03-26 LAB — BASIC METABOLIC PANEL
BUN: 17 mg/dL (ref 6–23)
CHLORIDE: 105 meq/L (ref 96–112)
CO2: 24 mEq/L (ref 19–32)
Calcium: 9.5 mg/dL (ref 8.4–10.5)
Creatinine, Ser: 0.8 mg/dL (ref 0.4–1.5)
GFR: 100.99 mL/min (ref >60.00)
Glucose, Bld: 170 mg/dL — ABNORMAL HIGH (ref 70–99)
Potassium: 4 mEq/L (ref 3.5–5.1)
Sodium: 136 mEq/L (ref 135–145)

## 2014-03-26 NOTE — Patient Instructions (Signed)
Remote monitoring is used to monitor your ICD from home. This monitoring reduces the number of office visits required to check your device to one time per year. It allows [place] to keep an eye on the functioning of your device to ensure it is working properly. You are scheduled for a device check from home on 06-27-2014. You may send your transmission at any time that day. If you have a wireless device, the transmission will be sent automatically. After your physician reviews your transmission, you will receive a postcard with your next transmission date.  Your physician recommends that you schedule a follow-up appointment in: 12 months with Dr.Allred

## 2014-03-26 NOTE — Progress Notes (Signed)
Cardiology Office Note    Date:  03/26/2014   ID:  Zachary Berry, DOB 1956/08/15, MRN [id number]  PCP:  Zachary Cirri DO  Cardiologist:  Zachary Berry   Electrophysiologist:  Zachary Berry    History of Present Illness: Zachary Berry is a 58 y.o. male with a hx of CAD s/p ant MI in 7/10 tx with DES to LAD, ischemic cardiomyopathy, systolic CHF, s/p AICD, diabetes, HL, depression.  Last seen by Zachary Berry 12/2012.  He has not taken any medications yet today.  He has chronic chest pain since his MI in 2010.  He denies any changes.  He denies any symptoms like prior angina.  He is NYHA 2-2b.  He denies orthopnea, PND, edema.  No syncope.  No ICD discharge.    Studies:  - LHC (04/03/2009):  EF 30%, anterior akinesis, apical dyskinesis, inferior hyperkinesis; diffuse left main 20%, ostial ramus 20%, ostial OM 30%, proximal LAD occluded >>> PCI:  Xience DES to prox LAD.  - Echo (12/2012):  EF 30-35%, anteroseptal hypokinesis, mild LAE  - Cardiac MRI (06/2010):  Full thickness scar of the dist ant wall, septum, apex with 2/3 thickness scar of mid and basal ant wall, EF 34%.   Recent Labs: No results found for requested labs within last 365 days.  Wt Readings from Last 3 Encounters:  03/26/14 194 lb (87.998 kg)  12/07/12 217 lb (98.431 kg)  06/24/12 214 lb (97.07 kg)      Past Medical History  Diagnosis Date  . CAD (coronary artery disease)   . Diabetes mellitus   . Learning disability   . Hyperlipidemia   . Poor vision   . Myocardial infarct   . Depression     Current Outpatient Prescriptions  Medication Sig Dispense Refill  . aspirin 81 MG tablet Take 81 mg by mouth daily.        . carvedilol (COREG) 25 MG tablet Take 1 tablet (25 mg total) by mouth 2 (two) times daily.  60 tablet  6  . clopidogrel (PLAVIX) 75 MG tablet Take 1 tablet (75 mg total) by mouth daily.  90 tablet  1  . enalapril (VASOTEC) 20 MG tablet Take 1 tablet (20 mg total) by mouth 2  (two) times daily.  60 tablet  0  . furosemide (LASIX) 40 MG tablet Take 0.5 tablets (20 mg total) by mouth daily.  45 tablet  0  . glipiZIDE (GLUCOTROL) 10 MG tablet Take 10 mg by mouth 2 (two) times daily before a meal.        . insulin glargine (LANTUS) 100 UNIT/ML injection Inject into the skin at bedtime. Use as directed      . metFORMIN (GLUCOPHAGE) 1000 MG tablet Take 1,000 mg by mouth 2 (two) times daily with a meal.        . nitroGLYCERIN (NITROSTAT) 0.4 MG SL tablet Place 0.4 mg under the tongue every 5 (five) minutes as needed.        . rosuvastatin (CRESTOR) 40 MG tablet Take 1 tablet (40 mg total) by mouth daily.  30 tablet  0  . sertraline (ZOLOFT) 100 MG tablet Take 200 mg by mouth daily.       Marland Kitchen spironolactone (ALDACTONE) 25 MG tablet Take 1 tablet (25 mg total) by mouth daily.  30 tablet  6   No current facility-administered medications for this visit.    Allergies:   Review of patient's allergies indicates no known allergies.  Social History:  The patient  reports that he has never smoked. He has never used smokeless tobacco. He reports that he does not drink alcohol or use illicit drugs.   Family History:  The patient's family history includes Diabetes in his father; Heart attack (age of onset: 3745) in his brother; Heart disease in his mother; Stomach cancer in an other family member.   ROS:  Please see the history of present illness.      All other systems reviewed and negative.   PHYSICAL EXAM: VS:  BP 130/90  Pulse 60  Ht 5\' 8"  (1.727 m)  Wt 194 lb (87.998 kg)  BMI 29.50 kg/m2 Well nourished, well developed, in no acute distress HEENT: normal Neck: no JVD Vascular:  No bruits bilaterally; DP/PT 2+ bilaterally  Cardiac:  normal S1, S2; RRR; no murmur Lungs:  clear to auscultation bilaterally, no wheezing, rhonchi or rales Abd: soft, nontender, no hepatomegaly Ext: no edema Skin: warm and dry Neuro:  CNs 2-12 intact, no focal abnormalities noted  EKG:  A  paced, HR 60, PRWP, NSSTTW changes, no change from prior tracing.     ASSESSMENT AND PLAN:  1. Chronic systolic heart failure:  Volume stable.  He is NYHA 2-2b.  Continue current Rx. Check BMET today.   2. ISCHEMIC CARDIOMYOPATHY:  Continue current dose of beta blocker, ACEI, Spironolactone.  Check BMET today.  3. Atherosclerosis of native coronary artery of native heart without angina pectoris:  He has chronic chest pain for years.  There has been no change.  Continue ASA, Plavix, beta blocker, statin.  4. HYPERTENSION:  BP elevated today.  He has not had any medications yet today.  Continue current Rx.  5. HYPERLIPIDEMIA-MIXED:  Continue statin.  Obtain recent Lipid panel from PCP. 6. Obesity:  He has lost 23 lbs since last seen by dieting.  I have congratulated him on this. 7. S/P implantation of automatic cardioverter/defibrillator (AICD):  F/u with Dr. Hillis RangeJames Berry today as planned. 8. Disposition:  F/u with Dr. Marca Zachary Berry in 6 mos.    Signed, Zachary RimScott Camira Geidel, PA-C, MHS 03/26/2014 8:30 AM    [facility] [address], [place], [place]  [address] Phone: [phone]; Fax: [phone]

## 2014-03-26 NOTE — Telephone Encounter (Signed)
pt notified about lab results with verbal understanding  

## 2014-03-26 NOTE — Patient Instructions (Signed)
Your physician recommends that you continue on your current medications as directed. Please refer to the Current Medication list given to you today.  Your physician recommends that you return for lab work in:  BMET TODAY   Your physician wants you to follow-up in: DR [facility] You will receive a reminder letter in the mail two months in advance. If you don't receive a letter, please call our office to schedule the follow-up appointment.

## 2014-03-26 NOTE — Progress Notes (Signed)
Cardiology/EP Office Note    Date:  03/26/2014   ID:  Zachary PersonCharles R Comas, DOB 1956/01/28, MRN [id number]  PCP:  Tanna FurryHOUT, BRITTANY, PA-C  Cardiologist:  Dr. Marca Anconaalton McLean   Electrophysiologist:  Dr. Hillis RangeJames Earmon Sherrow    History of Present Illness: Zachary Berry is a 58 y.o. male with a hx of CAD s/p ant MI in 7/10 tx with DES to LAD, ischemic cardiomyopathy, systolic CHF, s/p AICD, diabetes, HL, depression.  Last seen by Dr. Marca Anconaalton McLean 12/2012.  He has not taken any medications yet today.  He has chronic chest pain since his MI in 2010.  He denies any changes.  He denies any symptoms like prior angina.  He is NYHA 2-2b.  He denies orthopnea, PND, edema.  No syncope.  No ICD discharge.    Studies:  - LHC (04/03/2009):  EF 30%, anterior akinesis, apical dyskinesis, inferior hyperkinesis; diffuse left main 20%, ostial ramus 20%, ostial OM 30%, proximal LAD occluded >>> PCI:  Xience DES to prox LAD.  - Echo (12/2012):  EF 30-35%, anteroseptal hypokinesis, mild LAE  - Cardiac MRI (06/2010):  Full thickness scar of the dist ant wall, septum, apex with 2/3 thickness scar of mid and basal ant wall, EF 34%.   Recent Labs: No results found for requested labs within last 365 days.  Wt Readings from Last 3 Encounters:  03/26/14 194 lb (87.998 kg)  03/26/14 194 lb (87.998 kg)  12/07/12 217 lb (98.431 kg)      Past Medical History  Diagnosis Date  . CAD (coronary artery disease)   . Diabetes mellitus   . Learning disability   . Hyperlipidemia   . Poor vision   . Myocardial infarct   . Depression     Current Outpatient Prescriptions  Medication Sig Dispense Refill  . aspirin 81 MG tablet Take 81 mg by mouth daily.        . Canagliflozin-Metformin HCl (INVOKAMET) [phone] MG TABS Take 150-1,000 mg by mouth daily.      . carvedilol (COREG) 25 MG tablet Take 1 tablet (25 mg total) by mouth 2 (two) times daily.  60 tablet  6  . clopidogrel (PLAVIX) 75 MG tablet Take 1 tablet (75 mg total) by  mouth daily.  90 tablet  1  . enalapril (VASOTEC) 20 MG tablet Take 1 tablet (20 mg total) by mouth 2 (two) times daily.  60 tablet  0  . furosemide (LASIX) 40 MG tablet Take 0.5 tablets (20 mg total) by mouth daily.  45 tablet  0  . glipiZIDE (GLUCOTROL) 10 MG tablet Take 10 mg by mouth 2 (two) times daily before a meal.        . insulin glargine (LANTUS) 100 UNIT/ML injection Inject 45 Units into the skin at bedtime. Use as directed      . nitroGLYCERIN (NITROSTAT) 0.4 MG SL tablet Place 0.4 mg under the tongue every 5 (five) minutes as needed.        . rosuvastatin (CRESTOR) 40 MG tablet Take 1 tablet (40 mg total) by mouth daily.  30 tablet  0  . sertraline (ZOLOFT) 100 MG tablet Take 200 mg by mouth daily.       Marland Kitchen. spironolactone (ALDACTONE) 25 MG tablet Take 1 tablet (25 mg total) by mouth daily.  30 tablet  6   No current facility-administered medications for this visit.    Allergies:   Review of patient's allergies indicates no known allergies.   Social History:  The patient  reports that he has never smoked. He has never used smokeless tobacco. He reports that he does not drink alcohol or use illicit drugs.   Family History:  The patient's family history includes Diabetes in his brother, father, mother, and sister; Heart attack (age of onset: 29) in his brother; Heart disease in his mother; Heart failure in his mother; Hypertension in his father; Stomach cancer in an other family member; Stroke in his mother; Sudden death in his brother.   ROS:  Please see the history of present illness.      All other systems reviewed and negative.   PHYSICAL EXAM: VS:  BP 130/90  Pulse 60  Ht 5\' 8"  (1.727 m)  Wt 194 lb (87.998 kg)  BMI 29.50 kg/m2 Well nourished, well developed, in no acute distress HEENT: normal Neck: no JVD Vascular:  No bruits bilaterally; DP/PT 2+ bilaterally  Cardiac:  normal S1, S2; RRR; no murmur Lungs:  clear to auscultation bilaterally, no wheezing, rhonchi or  rales Abd: soft, nontender, no hepatomegaly Ext: no edema Skin: warm and dry Neuro:  CNs 2-12 intact, no focal abnormalities noted  EKG:  A paced, HR 60, PRWP, NSSTTW changes, no change from prior tracing.     ASSESSMENT AND PLAN:  1. Chronic systolic heart failure:  Volume stable.  He is NYHA 2-2b.  Continue current Rx. Check BMET today.   2. ISCHEMIC CARDIOMYOPATHY:  Continue current dose of beta blocker, ACEI, Spironolactone.  Check BMET today.  3. Atherosclerosis of native coronary artery of native heart without angina pectoris:  He has chronic chest pain for years.  There has been no change.  Continue ASA, Plavix, beta blocker, statin.  4. HYPERTENSION:  BP elevated today.  He has not had any medications yet today.  Continue current Rx.  5. HYPERLIPIDEMIA-MIXED:  Continue statin.  Obtain recent Lipid panel from PCP. 6. Obesity:  He has lost 23 lbs since last seen by dieting.  I have congratulated him on this. 7. S/P implantation of automatic cardioverter/defibrillator (AICD):  F/u with Dr. Hillis Range today as planned. 8. Disposition:  F/u with Dr. Marca Ancona in 6 mos.    Signed, Brynda Rim, MHS 03/26/2014 10:24 AM    [facility] [address] [place], [place], [place]  [address] Phone: [phone]; Fax: [phone]     I have seen, examined the patient, and reviewed the above assessment and plan.  Changes to above are made where necessary.   ICD interrogation today is reviewed and normal.  I have recommended ICM clinic, but he declines due to concerns of cost.  Continue carelink monitoring Return to see me in the device clinic in 1 year  Co Sign: Hillis Range, MD 03/26/2014 10:25 AM

## 2014-03-29 LAB — MDC_IDC_ENUM_SESS_TYPE_INCLINIC
Battery Voltage: 3.08 V
Brady Statistic AP VP Percent: 0.1 % — CL
Brady Statistic AP VS Percent: 38.4 %
Lead Channel Impedance Value: 532 Ohm
Lead Channel Pacing Threshold Amplitude: 0.75 V
Lead Channel Pacing Threshold Amplitude: 1 V
Lead Channel Pacing Threshold Pulse Width: 0.4 ms
Lead Channel Sensing Intrinsic Amplitude: 17.3 mV
Lead Channel Sensing Intrinsic Amplitude: 3.6 mV
Lead Channel Setting Pacing Amplitude: 2 V
Lead Channel Setting Pacing Pulse Width: 0.4 ms
MDC IDC MSMT LEADCHNL RA PACING THRESHOLD PULSEWIDTH: 0.4 ms
MDC IDC MSMT LEADCHNL RV IMPEDANCE VALUE: 494 Ohm
MDC IDC SET LEADCHNL RV PACING AMPLITUDE: 2.5 V
MDC IDC SET LEADCHNL RV SENSING SENSITIVITY: 0.3 mV
MDC IDC SET ZONE DETECTION INTERVAL: 340 ms
MDC IDC STAT BRADY AS VP PERCENT: 0.1 % — AB
MDC IDC STAT BRADY AS VS PERCENT: 61.6 %
Zone Setting Detection Interval: 300 ms
Zone Setting Detection Interval: 350 ms
Zone Setting Detection Interval: 360 ms

## 2014-04-18 ENCOUNTER — Telehealth: Payer: Self-pay | Admitting: Cardiology

## 2014-04-18 NOTE — Telephone Encounter (Signed)
MD with [facility] in [place] called and stated pt was being seen by him for a syncopal episode. MD is going to try and get industry to come and interrogate pt device. If this does not work out MD will call back to schedule pt with device clinic to have device interrogated. MD feels like the episode was not due to arrythmia but due to orthostatic. MD cell number is [phone]

## 2014-05-03 ENCOUNTER — Encounter: Payer: Self-pay | Admitting: Internal Medicine

## 2014-05-24 ENCOUNTER — Inpatient Hospital Stay (HOSPITAL_COMMUNITY)
Admission: EM | Admit: 2014-05-24 | Discharge: 2014-05-26 | DRG: 287 | Disposition: A | Discharge status: Home / Self Care | Payer: Medicare HMO | Attending: Cardiovascular Disease | Admitting: Cardiovascular Disease

## 2014-05-24 ENCOUNTER — Emergency Department (HOSPITAL_COMMUNITY): Payer: Medicare HMO

## 2014-05-24 ENCOUNTER — Encounter (HOSPITAL_COMMUNITY): Payer: Self-pay | Admitting: Emergency Medicine

## 2014-05-24 DIAGNOSIS — I10 Essential (primary) hypertension: Secondary | ICD-10-CM

## 2014-05-24 DIAGNOSIS — I5022 Chronic systolic (congestive) heart failure: Secondary | ICD-10-CM

## 2014-05-24 DIAGNOSIS — E1110 Type 2 diabetes mellitus with ketoacidosis without coma: Secondary | ICD-10-CM

## 2014-05-24 DIAGNOSIS — Z79899 Other long term (current) drug therapy: Secondary | ICD-10-CM

## 2014-05-24 DIAGNOSIS — G4733 Obstructive sleep apnea (adult) (pediatric): Secondary | ICD-10-CM

## 2014-05-24 DIAGNOSIS — Z95810 Presence of automatic (implantable) cardiac defibrillator: Secondary | ICD-10-CM

## 2014-05-24 DIAGNOSIS — E785 Hyperlipidemia, unspecified: Secondary | ICD-10-CM | POA: Diagnosis present

## 2014-05-24 DIAGNOSIS — F3289 Other specified depressive episodes: Secondary | ICD-10-CM | POA: Diagnosis present

## 2014-05-24 DIAGNOSIS — R0789 Other chest pain: Secondary | ICD-10-CM | POA: Diagnosis not present

## 2014-05-24 DIAGNOSIS — I2510 Atherosclerotic heart disease of native coronary artery without angina pectoris: Secondary | ICD-10-CM

## 2014-05-24 DIAGNOSIS — F329 Major depressive disorder, single episode, unspecified: Secondary | ICD-10-CM | POA: Diagnosis present

## 2014-05-24 DIAGNOSIS — I25119 Atherosclerotic heart disease of native coronary artery with unspecified angina pectoris: Secondary | ICD-10-CM

## 2014-05-24 DIAGNOSIS — I219 Acute myocardial infarction, unspecified: Secondary | ICD-10-CM

## 2014-05-24 DIAGNOSIS — I200 Unstable angina: Secondary | ICD-10-CM

## 2014-05-24 DIAGNOSIS — Z9861 Coronary angioplasty status: Secondary | ICD-10-CM

## 2014-05-24 DIAGNOSIS — I25110 Atherosclerotic heart disease of native coronary artery with unstable angina pectoris: Secondary | ICD-10-CM

## 2014-05-24 DIAGNOSIS — I959 Hypotension, unspecified: Secondary | ICD-10-CM | POA: Diagnosis not present

## 2014-05-24 DIAGNOSIS — I252 Old myocardial infarction: Secondary | ICD-10-CM

## 2014-05-24 DIAGNOSIS — I255 Ischemic cardiomyopathy: Secondary | ICD-10-CM

## 2014-05-24 DIAGNOSIS — I2589 Other forms of chronic ischemic heart disease: Secondary | ICD-10-CM

## 2014-05-24 DIAGNOSIS — F8189 Other developmental disorders of scholastic skills: Secondary | ICD-10-CM | POA: Diagnosis present

## 2014-05-24 DIAGNOSIS — I509 Heart failure, unspecified: Secondary | ICD-10-CM | POA: Diagnosis present

## 2014-05-24 DIAGNOSIS — E119 Type 2 diabetes mellitus without complications: Secondary | ICD-10-CM

## 2014-05-24 DIAGNOSIS — Z7982 Long term (current) use of aspirin: Secondary | ICD-10-CM

## 2014-05-24 DIAGNOSIS — Z794 Long term (current) use of insulin: Secondary | ICD-10-CM

## 2014-05-24 LAB — CBC
HEMATOCRIT: 42.1 % (ref 39.0–52.0)
HEMOGLOBIN: 14.5 g/dL (ref 13.0–17.0)
MCH: 31.1 pg (ref 26.0–34.0)
MCHC: 34.4 g/dL (ref 30.0–36.0)
MCV: 90.3 fL (ref 78.0–100.0)
Platelets: 275 10*3/uL (ref 150–400)
RBC: 4.66 MIL/uL (ref 4.22–5.81)
RDW: 13.5 % (ref 11.5–15.5)
WBC: 6.3 10*3/uL (ref 4.0–10.5)

## 2014-05-24 LAB — PRO B NATRIURETIC PEPTIDE: Pro B Natriuretic peptide (BNP): 56.3 pg/mL (ref 0–125)

## 2014-05-24 LAB — BASIC METABOLIC PANEL
Anion gap: 15 (ref 5–15)
BUN: 14 mg/dL (ref 6–23)
CALCIUM: 9.9 mg/dL (ref 8.4–10.5)
CO2: 24 meq/L (ref 19–32)
Chloride: 100 mEq/L (ref 96–112)
Creatinine, Ser: 0.73 mg/dL (ref 0.50–1.35)
GFR calc Af Amer: >90 mL/min (ref >90)
Glucose, Bld: 178 mg/dL — ABNORMAL HIGH (ref 70–99)
POTASSIUM: 4.4 meq/L (ref 3.7–5.3)
SODIUM: 139 meq/L (ref 137–147)

## 2014-05-24 LAB — I-STAT TROPONIN, ED: Troponin i, poc: 0 ng/mL (ref 0.00–0.08)

## 2014-05-24 LAB — TROPONIN I

## 2014-05-24 LAB — GLUCOSE, CAPILLARY: Glucose-Capillary: 193 mg/dL — ABNORMAL HIGH (ref 70–99)

## 2014-05-24 MED ORDER — CARVEDILOL 25 MG PO TABS
25.0000 mg | ORAL_TABLET | Freq: Two times a day (BID) | ORAL | Status: DC
  Administered 2014-05-24 – 2014-05-25 (×2): 25 mg via ORAL
  Filled 2014-05-24 (×3): qty 1

## 2014-05-24 MED ORDER — CLOPIDOGREL BISULFATE 75 MG PO TABS
75.0000 mg | ORAL_TABLET | Freq: Every day | ORAL | Status: DC
  Administered 2014-05-25 – 2014-05-26 (×2): 75 mg via ORAL
  Filled 2014-05-24 (×2): qty 1

## 2014-05-24 MED ORDER — HEPARIN (PORCINE) IN NACL 100-0.45 UNIT/ML-% IJ SOLN
1200.0000 [IU]/h | INTRAMUSCULAR | Status: DC
  Administered 2014-05-24: 1000 [IU]/h via INTRAVENOUS
  Filled 2014-05-24 (×3): qty 250

## 2014-05-24 MED ORDER — HEPARIN BOLUS VIA INFUSION
4000.0000 [IU] | Freq: Once | INTRAVENOUS | Status: AC
  Administered 2014-05-24: 4000 [IU] via INTRAVENOUS
  Filled 2014-05-24: qty 4000

## 2014-05-24 MED ORDER — SERTRALINE HCL 100 MG PO TABS
200.0000 mg | ORAL_TABLET | Freq: Every day | ORAL | Status: DC
  Administered 2014-05-24 – 2014-05-26 (×3): 200 mg via ORAL
  Filled 2014-05-24 (×3): qty 2

## 2014-05-24 MED ORDER — FENOFIBRATE 160 MG PO TABS
160.0000 mg | ORAL_TABLET | Freq: Every day | ORAL | Status: DC
  Administered 2014-05-25 – 2014-05-26 (×2): 160 mg via ORAL
  Filled 2014-05-24 (×2): qty 1

## 2014-05-24 MED ORDER — ASPIRIN 81 MG PO CHEW: 324.0000 mg | CHEWABLE_TABLET | Freq: Once | ORAL | Status: DC

## 2014-05-24 MED ORDER — ENALAPRIL MALEATE 20 MG PO TABS
20.0000 mg | ORAL_TABLET | Freq: Two times a day (BID) | ORAL | Status: DC
  Administered 2014-05-25: 20 mg via ORAL
  Filled 2014-05-24 (×2): qty 1

## 2014-05-24 MED ORDER — SODIUM CHLORIDE 0.9 % IJ SOLN: 3.0000 mL | INTRAMUSCULAR | Status: DC | PRN

## 2014-05-24 MED ORDER — SODIUM CHLORIDE 0.9 % IJ SOLN
3.0000 mL | Freq: Two times a day (BID) | INTRAMUSCULAR | Status: DC
  Administered 2014-05-24 – 2014-05-25 (×2): 3 mL via INTRAVENOUS

## 2014-05-24 MED ORDER — ACETAMINOPHEN 325 MG PO TABS
650.0000 mg | ORAL_TABLET | ORAL | Status: DC | PRN
  Administered 2014-05-24: 650 mg via ORAL
  Filled 2014-05-24: qty 2

## 2014-05-24 MED ORDER — SODIUM CHLORIDE 0.9 % IV BOLUS (SEPSIS): 1000.0000 mL | Freq: Once | INTRAVENOUS | Status: DC

## 2014-05-24 MED ORDER — NITROGLYCERIN 0.4 MG SL SUBL
0.4000 mg | SUBLINGUAL_TABLET | SUBLINGUAL | Status: DC | PRN
  Administered 2014-05-24: 0.4 mg via SUBLINGUAL
  Filled 2014-05-24: qty 1

## 2014-05-24 MED ORDER — SODIUM CHLORIDE 0.9 % IV SOLN: 250.0000 mL | INTRAVENOUS | Status: DC | PRN

## 2014-05-24 MED ORDER — INSULIN ASPART 100 UNIT/ML ~~LOC~~ SOLN: 0.0000 [IU] | Freq: Three times a day (TID) | SUBCUTANEOUS | Status: DC

## 2014-05-24 MED ORDER — ONDANSETRON HCL 4 MG/2ML IJ SOLN: 4.0000 mg | Freq: Four times a day (QID) | INTRAMUSCULAR | Status: DC | PRN

## 2014-05-24 MED ORDER — ATORVASTATIN CALCIUM 80 MG PO TABS
80.0000 mg | ORAL_TABLET | Freq: Every day | ORAL | Status: DC
  Filled 2014-05-24 (×2): qty 1

## 2014-05-24 MED ORDER — ASPIRIN 81 MG PO CHEW
81.0000 mg | CHEWABLE_TABLET | ORAL | Status: AC
  Administered 2014-05-25: 81 mg via ORAL
  Filled 2014-05-24: qty 1

## 2014-05-24 MED ORDER — MORPHINE SULFATE 2 MG/ML IJ SOLN
2.0000 mg | Freq: Once | INTRAMUSCULAR | Status: AC
  Administered 2014-05-24: 2 mg via INTRAVENOUS
  Filled 2014-05-24: qty 1

## 2014-05-24 MED ORDER — ASPIRIN EC 81 MG PO TBEC
81.0000 mg | DELAYED_RELEASE_TABLET | Freq: Every day | ORAL | Status: DC
  Administered 2014-05-25 – 2014-05-26 (×2): 81 mg via ORAL
  Filled 2014-05-24 (×2): qty 1

## 2014-05-24 MED ORDER — ASPIRIN EC 81 MG PO TBEC: 81.0000 mg | DELAYED_RELEASE_TABLET | Freq: Every day | ORAL | Status: DC

## 2014-05-24 MED ORDER — FUROSEMIDE 20 MG PO TABS
20.0000 mg | ORAL_TABLET | Freq: Every day | ORAL | Status: DC
  Administered 2014-05-25: 20 mg via ORAL
  Filled 2014-05-24: qty 1

## 2014-05-24 MED ORDER — INSULIN GLARGINE 100 UNIT/ML ~~LOC~~ SOLN
45.0000 [IU] | Freq: Every day | SUBCUTANEOUS | Status: DC
  Administered 2014-05-24 – 2014-05-25 (×2): 45 [IU] via SUBCUTANEOUS
  Filled 2014-05-24 (×4): qty 0.45

## 2014-05-24 MED ORDER — SPIRONOLACTONE 25 MG PO TABS
25.0000 mg | ORAL_TABLET | Freq: Every day | ORAL | Status: DC
  Administered 2014-05-25 – 2014-05-26 (×2): 25 mg via ORAL
  Filled 2014-05-24 (×2): qty 1

## 2014-05-24 NOTE — Progress Notes (Signed)
ANTICOAGULATION CONSULT NOTE - Initial Consult  Pharmacy Consult for heparin Indication: chest pain/ACS  No Known Allergies  Patient Measurements: Height: 5\' 8"  (172.7 cm) Weight: 189 lb (85.73 kg) IBW/kg (Calculated) : 68.4 Heparin Dosing Weight: 85kg  Vital Signs: Temp: 97.8 F (36.6 C) (09/17 2021) Temp src: Oral (09/17 2021) BP: 101/69 mmHg (09/17 2021) Pulse Rate: 59 (09/17 2021)  Labs:  Recent Labs  05/24/14 1419  HGB 14.5  HCT 42.1  PLT 275  CREATININE 0.73    Estimated Creatinine Clearance: 107.2 ml/min (by C-G formula based on Cr of 0.73).   Medical History: Past Medical History  Diagnosis Date  . CAD (coronary artery disease)   . Diabetes mellitus   . Learning disability   . Hyperlipidemia   . Poor vision   . Myocardial infarct   . Depression    Assessment: 58 year old male with history of CAD presents with chest pain. Orders to start heparin with likely cath in am. Baseline cbc is normal.  Goal of Therapy:  Heparin level 0.3-0.7 units/ml Monitor platelets by anticoagulation protocol: Yes   Plan:  Give 4000 units bolus x 1 Start heparin infusion at 1000 units/hr Check anti-Xa level in 6 hours and daily while on heparin Continue to monitor H&H and platelets  Sheppard Coil PharmD., BCPS Clinical Pharmacist Pager [phone] 05/24/2014 8:55 PM

## 2014-05-24 NOTE — ED Notes (Signed)
Attempted Report 

## 2014-05-24 NOTE — ED Provider Notes (Signed)
TIME SEEN: 3:25 PM  CHIEF COMPLAINT: Chest pain, diaphoresis  HPI: Patient is a 58 y.o. M with history of coronary artery disease status post STEMI and 2010 with drug-eluting stent to his LAD, ischemic cardiomyopathy status post AICD in 2011, diabetes, hyperlipidemia who presents to the emergency department complaints of chest pressure that started 3-4 hours ago with associated mild diaphoresis. No shortness of breath, nausea or vomiting, lightheadedness. States this feels similar to his prior MI. States that he normally has "uncomfortable" chest pain every day that he has learned to deal with. He states this however felt different and was concerning for him because it felt like his prior heart attack only milder. He states he has not had similar chest pain since his catheterization in 2010. Denies any lower extremity swelling or pain. No history of PE or DVT. He has had some mild congestion, sore throat but no fever or cough.  ROS: See HPI Constitutional: no fever  Eyes: no drainage  ENT: no runny nose   Cardiovascular:   chest pain  Resp: no SOB  GI: no vomiting GU: no dysuria Integumentary: no rash  Allergy: no hives  Musculoskeletal: no leg swelling  Neurological: no slurred speech ROS otherwise negative  PAST MEDICAL HISTORY/PAST SURGICAL HISTORY:  Past Medical History  Diagnosis Date  . CAD (coronary artery disease)   . Diabetes mellitus   . Learning disability   . Hyperlipidemia   . Poor vision   . Myocardial infarct   . Depression     MEDICATIONS:  Prior to Admission medications   Medication Sig Start Date End Date Taking? Authorizing Provider  aspirin 81 MG tablet Take 81 mg by mouth daily.      Historical Provider, MD  Canagliflozin-Metformin HCl (INVOKAMET) [phone] MG TABS Take 150-1,000 mg by mouth daily.    Historical Provider, MD  carvedilol (COREG) 25 MG tablet Take 1 tablet (25 mg total) by mouth 2 (two) times daily. 07/11/13 07/11/14  Hillis Range, MD   clopidogrel (PLAVIX) 75 MG tablet Take 1 tablet (75 mg total) by mouth daily. 04/18/13   Laurey Morale, MD  enalapril (VASOTEC) 20 MG tablet Take 1 tablet (20 mg total) by mouth 2 (two) times daily. 09/13/13   Laurey Morale, MD  furosemide (LASIX) 40 MG tablet Take 0.5 tablets (20 mg total) by mouth daily. 12/22/13   Laurey Morale, MD  glipiZIDE (GLUCOTROL) 10 MG tablet Take 10 mg by mouth 2 (two) times daily before a meal.      Historical Provider, MD  insulin glargine (LANTUS) 100 UNIT/ML injection Inject 45 Units into the skin at bedtime. Use as directed    Historical Provider, MD  nitroGLYCERIN (NITROSTAT) 0.4 MG SL tablet Place 0.4 mg under the tongue every 5 (five) minutes as needed.      Historical Provider, MD  rosuvastatin (CRESTOR) 40 MG tablet Take 1 tablet (40 mg total) by mouth daily. 09/13/12   Hillis Range, MD  sertraline (ZOLOFT) 100 MG tablet Take 200 mg by mouth daily.     Historical Provider, MD  spironolactone (ALDACTONE) 25 MG tablet Take 1 tablet (25 mg total) by mouth daily. 05/12/13   Laurey Morale, MD    ALLERGIES:  No Known Allergies  SOCIAL HISTORY:  History  Substance Use Topics  . Smoking status: Never Smoker   . Smokeless tobacco: Never Used     Comment: former passive smoker  . Alcohol Use: No    FAMILY HISTORY: Family History  Problem Relation Age of Onset  . Diabetes Father   . Heart disease Mother   . Heart attack Brother 45  . Stomach cancer      uncle  . Heart failure Mother   . Diabetes Mother   . Diabetes Sister   . Diabetes Brother   . Hypertension Father   . Sudden death Brother   . Stroke Mother     EXAM: BP 128/71  Pulse 66  Temp(Src) 98.3 F (36.8 C) (Oral)  Resp 17  SpO2 98% CONSTITUTIONAL: Alert and oriented and responds appropriately to questions. Well-appearing; well-nourished HEAD: Normocephalic EYES: Conjunctivae clear, PERRL ENT: normal nose; no rhinorrhea; moist mucous membranes; pharynx without lesions  noted NECK: Supple, no meningismus, no LAD  CARD: RRR; S1 and S2 appreciated; no murmurs, no clicks, no rubs, no gallops RESP: Normal chest excursion without splinting or tachypnea; breath sounds clear and equal bilaterally; no wheezes, no rhonchi, no rales, no respiratory distress or hypoxia, speaking full sentences, no increased work of breathing, chest wall is nontender to palpation ABD/GI: Normal bowel sounds; non-distended; soft, non-tender, no rebound, no guarding BACK:  The back appears normal and is non-tender to palpation, there is no CVA tenderness EXT: Normal ROM in all joints; non-tender to palpation; no edema; normal capillary refill; no cyanosis, no calf tenderness or swelling    SKIN: Normal color for age and race; warm NEURO: Moves all extremities equally PSYCH: The patient's mood and manner are appropriate. Grooming and personal hygiene are appropriate.  MEDICAL DECISION MAKING: Patient here with chest pain and diaphoresis that felt like his prior MI. EKG shows Q waves in septal leads that were present in 2010. No other ischemic changes noted. Troponin x1 negative. Chest x-ray clear. He did take a full aspirin this morning. We'll give nitroglycerin to attempt to get patient chest pain-free. Discussed with cardiology to see the patient in the ED. I feel that he will need admission for ACS rule out.  ED PROGRESS: Patient is pain-free after nitroglycerin. He is still hemodynamically stable. Currently on she has seen and plan to admit patient for ACS workup.       EKG Interpretation  Date/Time:  Thursday May 24 2014 14:12:52 EDT Ventricular Rate:  72 PR Interval:  162 QRS Duration: 94 QT Interval:  398 QTC Calculation: 435 R Axis:   29 Text Interpretation:  Normal sinus rhythm Septal infarct , age undetermined Abnormal ECG Confirmed by WARD,  DO, KRISTEN [phone]) on 05/24/2014 3:19:15 PM        Layla Maw Ward, DO 05/24/14 1734

## 2014-05-24 NOTE — ED Notes (Addendum)
Pt reports 3/10 left side chest pressure starting this AM. Denies SOB, diaphoresis, N/V. Pt has hx of cardiac arrest, 1 stent (states this pressure feels similar to last MI). Pt in NAD. AO x4.

## 2014-05-24 NOTE — ED Notes (Addendum)
Pt monitored by pulse ox, bp cuff, and 12-lead. 

## 2014-05-24 NOTE — H&P (Signed)
Patient ID: Zachary Berry MRN: [id number], DOB/AGE: September 06, 1956   Admit date: 05/24/2014   Primary Physician: Tanna Furry, PA-C Primary Cardiologist: Dr. Shirlee Latch Primary Electrophysiologist: Dr. Johney Frame  Pt. Profile:  58 year old Caucasian male with past medical history of diabetes, hyperlipidemia, depression, chronic systolic heart failure with baseline EF 30-35%, ischemic cardiomyopathy status post AICD in 2011, history of coronary artery disease status post STEMI in 2010 with drug-eluting stent placed in proximal LAD and history of chronic chest pain since his MI in 2010 presented to [facility] cone doctor starting to have chest pains is reminiscent to his previous anginal symptoms  Problem List  Past Medical History  Diagnosis Date  . CAD (coronary artery disease)   . Diabetes mellitus   . Learning disability   . Hyperlipidemia   . Poor vision   . Myocardial infarct   . Depression     Past Surgical History  Procedure Laterality Date  . Pci lad  7/10  . Hand surgery    . Icd implant  07/18/10    MDT ICD implanted by Dr Johney Frame     Allergies  No Known Allergies  HPI  The patient is a 58 year old Caucasian male with past medical history of diabetes, hyperlipidemia, depression, chronic systolic heart failure with baseline EF 30-35%, ischemic cardiomyopathy status post AICD in 2011, history of coronary artery disease status post STEMI in 2010 with drug-eluting stent placed in proximal LAD and history of chronic chest pain since his MI in 2010. He had a cardiac MRI on 06/23/2010 which showed a full-thickness scarring involving distal anterior wall, septum and apex, two third thickness scarring involving the mid and basal inferior wall, EF 34%. Patient's last echocardiogram was on 12/21/2012 which showed EF 30-35%, severe hypokinesis of the mid anteroseptal myocardium. Since his MI, he has been troubled with chronic chest discomfort. However, this chronic pain was different from  his anginal pain and he has attributed it to a weakened heart. His last followup with cardiology office was on 03/26/2014, at which time he denies any anginal pain and his chronic chest discomfort has been stable. He was admitted to [facility] on August 12 for syncopal episode. Device interrogation at that time was normal. Apparently he went to [facility] because he had a transient syncopal episode while going to the bathroom at night. His blood pressure was noted to be low at time 98/62. His Lasix was cut down from 40 mg daily down to 20 mg daily while resuming carvedilol and enalapril. He states he has not been very active at home. He does okay with light exercise on treadmill. The last time he used his treadmill was 3 days ago at which time he walked about 100 yards without significant chest discomfort. He denies any lower extremity edema, orthopnea or paroxysmal nocturnal dyspnea. Denies any recent fever, chill, or cough.  This morning between 11:30am to 12:00pm, while feeding his dog, he felt onset of left shoulder pain radiating to his left chest. He described the pain as pressure-like sensation which was reminiscent of his previous MI in 2010. The chest pain persisted until he arrived at [facility] ED. He was given sublingual nitroglycerin which greatly improve the chest discomfort. On arrival to ED, his blood pressure was 138/70. O2 saturation 98% on room air. Chest x-ray was negative for acute process. EKG was obtained which noted normal sinus rhythm with heart rate in the 70s, Q wave in V1 and V2, however no significant ST-T  wave changes. Cardiology has been consulted for chest pain.  Of note, during his last cardiology followup, his device was also interrogated which showed no evidence of any ventricular arrhythmia.   Home Medications  Prior to Admission medications   Medication Sig Start Date End Date Taking? Authorizing Provider  nitroGLYCERIN (NITROSTAT) 0.4 MG SL  tablet Place 0.4 mg under the tongue every 5 (five) minutes as needed for chest pain.    Yes Historical Provider, MD  aspirin EC 81 MG tablet Take 81 mg by mouth daily.    Historical Provider, MD  Canagliflozin-Metformin HCl (INVOKAMET) [phone] MG TABS Take 1 tablet by mouth daily.     Historical Provider, MD  carvedilol (COREG) 25 MG tablet Take 1 tablet (25 mg total) by mouth 2 (two) times daily. 07/11/13 07/11/14  Hillis Range, MD  clopidogrel (PLAVIX) 75 MG tablet Take 1 tablet (75 mg total) by mouth daily. 04/18/13   Laurey Morale, MD  enalapril (VASOTEC) 20 MG tablet Take 1 tablet (20 mg total) by mouth 2 (two) times daily. 09/13/13   Laurey Morale, MD  fenofibrate 160 MG tablet Take 160 mg by mouth daily. 05/12/14   Historical Provider, MD  furosemide (LASIX) 40 MG tablet Take 0.5 tablets (20 mg total) by mouth daily. 12/22/13   Laurey Morale, MD  glipiZIDE (GLUCOTROL) 10 MG tablet Take 10 mg by mouth 2 (two) times daily before a meal.      Historical Provider, MD  insulin glargine (LANTUS) 100 UNIT/ML injection Inject 45 Units into the skin at bedtime. Use as directed    Historical Provider, MD  rosuvastatin (CRESTOR) 40 MG tablet Take 1 tablet (40 mg total) by mouth daily. 09/13/12   Hillis Range, MD  sertraline (ZOLOFT) 100 MG tablet Take 200 mg by mouth daily.     Historical Provider, MD  spironolactone (ALDACTONE) 25 MG tablet Take 1 tablet (25 mg total) by mouth daily. 05/12/13   Laurey Morale, MD    Family History  Family History  Problem Relation Age of Onset  . Diabetes Father   . Heart disease Mother   . Heart attack Brother 45  . Stomach cancer      uncle  . Heart failure Mother   . Diabetes Mother   . Diabetes Sister   . Diabetes Brother   . Hypertension Father   . Sudden death Brother   . Stroke Mother     Social History  History   Social History  . Marital Status: Married    Spouse Name: N/A    Number of Children: N/A  . Years of Education: N/A    Occupational History  . Disabled     due to CAD   Social History Main Topics  . Smoking status: Never Smoker   . Smokeless tobacco: Never Used     Comment: former passive smoker  . Alcohol Use: No  . Drug Use: No  . Sexual Activity: Not on file   Other Topics Concern  . Not on file   Social History Narrative  . No narrative on file     Review of Systems General:  No chills, fever, night sweats or weight changes.  Cardiovascular:  No dyspnea on exertion, edema, orthopnea, palpitations, paroxysmal nocturnal dyspnea. +chest pain, diaphoresis Dermatological: No rash, lesions/masses Respiratory: No cough, dyspnea Urologic: No hematuria, dysuria Abdominal:   No nausea, vomiting, diarrhea, bright red blood per rectum, melena, or hematemesis Neurologic:  No visual changes, wkns, changes  in mental status. All other systems reviewed and are otherwise negative except as noted above.  Physical Exam  Blood pressure 98/74, pulse 77, temperature 98.3 F (36.8 C), temperature source Oral, resp. rate 15, SpO2 96.00%.  General: Pleasant, NAD Psych: Normal affect. Neuro: Alert and oriented X 3. Moves all extremities spontaneously. HEENT: Normal  Neck: Supple without bruits or JVD. Lungs:  Resp regular and unlabored, CTA. Heart: RRR no s3, s4, or murmurs. Abdomen: Soft, non-tender, non-distended, BS + x 4.  Extremities: No clubbing, cyanosis or edema. DP/PT/Radials 2+ and equal bilaterally.  Labs  Troponin [facility] of Care Test)  Recent Labs  05/24/14 1432  TROPIPOC 0.00   No results found for this basename: CKTOTAL, CKMB, TROPONINI,  in the last 72 hours Lab Results  Component Value Date   WBC 6.3 05/24/2014   HGB 14.5 05/24/2014   HCT 42.1 05/24/2014   MCV 90.3 05/24/2014   PLT 275 05/24/2014    Recent Labs Lab 05/24/14 1419  NA 139  K 4.4  CL 100  CO2 24  BUN 14  CREATININE 0.73  CALCIUM 9.9  GLUCOSE 178*   Lab Results  Component Value Date   CHOL 83 07/22/2011    HDL 35.20* 07/22/2011   LDLCALC 8 07/22/2011   TRIG 199.0* 07/22/2011   No results found for this basename: DDIMER     Radiology/Studies  Dg Chest 2 View  05/24/2014   CLINICAL DATA:  Chest pain.  EXAM: CHEST  2 VIEW  COMPARISON:  04/17/2014  FINDINGS: Right-sided dual lead ICD remains in place. Cardiomediastinal silhouette is within normal limits. The lungs are well inflated and clear. No pleural effusion or pneumothorax is identified. Mild thoracic spondylosis is noted.  IMPRESSION: No active cardiopulmonary disease.   Electronically Signed   By: Sebastian Ache   On: 05/24/2014 15:19    ECG  normal sinus rhythm with heart rate in the 70s, Q wave in V1 and V2, however no significant ST-T wave changes  Echocardiogram  LV EF: 30% - 35%  ------------------------------------------------------------ Indications: CHF - 428.0.  ------------------------------------------------------------ History: PMH: Acquired from the patient and from the patient's chart. Risk factors: Chronic systolic heart failure. CAD. Ischemic cardiomyopathy. Diabetes mellitus.  ------------------------------------------------------------ Study Conclusions  - Left ventricle: The cavity size was moderately dilated. Wall thickness was normal. Systolic function was mildly to moderately reduced. The estimated ejection fraction was in the range of 30% to 35%. Severe hypokinesis of the midanteroseptal myocardium. - Left atrium: The atrium was mildly dilated.      ASSESSMENT AND PLAN  1. Chest pain concerning for unstable angina  - reminiscent of previous anginal symptom  - high risk factor, may need cardiac catheterization to definitively access coronary anatomy  - admit to obs, trend enzyme overnight  2. CAD s/p STEMI and out of hospital cardiac arrest in 2010  - LHC (04/03/2009): EF 30%, anterior akinesis, apical dyskinesis, inferior hyperkinesis; diffuse left main 20%, ostial ramus 20%, ostial OM 30%,  proximal LAD occluded >>> PCI: Xience DES to prox LAD  - Cardiac MRI (06/2010): Full thickness scar of the dist ant wall, septum, apex with 2/3 thickness scar of mid and basal ant wall, EF 34%.  - Echo (12/2012): EF 30-35%, anteroseptal hypokinesis, mild LAE  3. ischemic cardiomyopathy status post AICD in 2011  - no ventricular ectopy on recent interrogation and functioning normally  4. chronic systolic heart failure with baseline EF 30-35% 5. Diabetes 6. Hyperlipidemia 7. depression  Signed, Azalee Course, PA-C  05/24/2014, 4:45 PM    Patient seen and examined. Agree with assessment and plan. 58 yo WM who is 5 yrs s/p out of hospital cardiac arrest with anterior wall MI treated with PCI to LAD with DES stent. He is s/p ICD and has EF 30 -35%. He has noticed recurrent symptoms that are similar to 2010 that significantly increased today and improved but not completely resolved with NTG. ECG does not show acute change. Will admit with possible unstable angina. Will heparinize. Plan definitive cardiac catheterization tomorrow with possible PCI if necessary. Pt is agreeable and wishes to pursue cath.   Lennette Bihari, MD, [facility] 05/24/2014 5:45 PM

## 2014-05-24 NOTE — ED Notes (Signed)
Meal tray ordered for patient.

## 2014-05-24 NOTE — ED Notes (Signed)
PT STATED THAT AFTER SL NITRO CHEST TIGHTNESS HAS DECREASED FROM A 3 TO A 1.

## 2014-05-25 ENCOUNTER — Encounter (HOSPITAL_COMMUNITY)
Admission: EM | Disposition: A | Discharge status: Home / Self Care | Payer: Self-pay | Source: Home / Self Care | Attending: Cardiovascular Disease

## 2014-05-25 DIAGNOSIS — F329 Major depressive disorder, single episode, unspecified: Secondary | ICD-10-CM | POA: Diagnosis present

## 2014-05-25 DIAGNOSIS — I209 Angina pectoris, unspecified: Secondary | ICD-10-CM | POA: Diagnosis not present

## 2014-05-25 DIAGNOSIS — Z794 Long term (current) use of insulin: Secondary | ICD-10-CM | POA: Diagnosis not present

## 2014-05-25 DIAGNOSIS — F8189 Other developmental disorders of scholastic skills: Secondary | ICD-10-CM | POA: Diagnosis present

## 2014-05-25 DIAGNOSIS — Z7982 Long term (current) use of aspirin: Secondary | ICD-10-CM | POA: Diagnosis not present

## 2014-05-25 DIAGNOSIS — I509 Heart failure, unspecified: Secondary | ICD-10-CM | POA: Diagnosis present

## 2014-05-25 DIAGNOSIS — I252 Old myocardial infarction: Secondary | ICD-10-CM | POA: Diagnosis not present

## 2014-05-25 DIAGNOSIS — E785 Hyperlipidemia, unspecified: Secondary | ICD-10-CM

## 2014-05-25 DIAGNOSIS — I959 Hypotension, unspecified: Secondary | ICD-10-CM | POA: Diagnosis not present

## 2014-05-25 DIAGNOSIS — R0789 Other chest pain: Secondary | ICD-10-CM | POA: Diagnosis present

## 2014-05-25 DIAGNOSIS — I2510 Atherosclerotic heart disease of native coronary artery without angina pectoris: Secondary | ICD-10-CM

## 2014-05-25 DIAGNOSIS — I5022 Chronic systolic (congestive) heart failure: Secondary | ICD-10-CM | POA: Diagnosis present

## 2014-05-25 DIAGNOSIS — F3289 Other specified depressive episodes: Secondary | ICD-10-CM | POA: Diagnosis present

## 2014-05-25 DIAGNOSIS — Z9861 Coronary angioplasty status: Secondary | ICD-10-CM | POA: Diagnosis not present

## 2014-05-25 DIAGNOSIS — I2589 Other forms of chronic ischemic heart disease: Secondary | ICD-10-CM | POA: Diagnosis present

## 2014-05-25 DIAGNOSIS — E119 Type 2 diabetes mellitus without complications: Secondary | ICD-10-CM | POA: Diagnosis present

## 2014-05-25 DIAGNOSIS — Z95810 Presence of automatic (implantable) cardiac defibrillator: Secondary | ICD-10-CM | POA: Diagnosis not present

## 2014-05-25 DIAGNOSIS — I10 Essential (primary) hypertension: Secondary | ICD-10-CM

## 2014-05-25 DIAGNOSIS — Z79899 Other long term (current) drug therapy: Secondary | ICD-10-CM | POA: Diagnosis not present

## 2014-05-25 HISTORY — PX: LEFT HEART CATHETERIZATION WITH CORONARY ANGIOGRAM: SHX5451

## 2014-05-25 LAB — LIPID PANEL
Cholesterol: 107 mg/dL (ref 0–200)
HDL: 41 mg/dL (ref >39)
LDL CALC: 16 mg/dL (ref 0–99)
Total CHOL/HDL Ratio: 2.6 RATIO
Triglycerides: 252 mg/dL — ABNORMAL HIGH (ref <150)
VLDL: 50 mg/dL — AB (ref 0–40)

## 2014-05-25 LAB — PROTIME-INR
INR: 1.03 (ref 0.00–1.49)
Prothrombin Time: 13.5 seconds (ref 11.6–15.2)

## 2014-05-25 LAB — BASIC METABOLIC PANEL
Anion gap: 14 (ref 5–15)
BUN: 14 mg/dL (ref 6–23)
CALCIUM: 9.4 mg/dL (ref 8.4–10.5)
CO2: 24 mEq/L (ref 19–32)
Chloride: 102 mEq/L (ref 96–112)
Creatinine, Ser: 0.71 mg/dL (ref 0.50–1.35)
GFR calc Af Amer: >90 mL/min (ref >90)
GLUCOSE: 183 mg/dL — AB (ref 70–99)
POTASSIUM: 3.8 meq/L (ref 3.7–5.3)
SODIUM: 140 meq/L (ref 137–147)

## 2014-05-25 LAB — GLUCOSE, CAPILLARY
GLUCOSE-CAPILLARY: 184 mg/dL — AB (ref 70–99)
GLUCOSE-CAPILLARY: 138 mg/dL — AB (ref 70–99)
GLUCOSE-CAPILLARY: 181 mg/dL — AB (ref 70–99)
Glucose-Capillary: 175 mg/dL — ABNORMAL HIGH (ref 70–99)
Glucose-Capillary: 167 mg/dL — ABNORMAL HIGH (ref 70–99)

## 2014-05-25 LAB — CBC
HEMATOCRIT: 40.3 % (ref 39.0–52.0)
Hemoglobin: 13.9 g/dL (ref 13.0–17.0)
MCH: 31 pg (ref 26.0–34.0)
MCHC: 34.5 g/dL (ref 30.0–36.0)
MCV: 90 fL (ref 78.0–100.0)
Platelets: 269 10*3/uL (ref 150–400)
RBC: 4.48 MIL/uL (ref 4.22–5.81)
RDW: 13.3 % (ref 11.5–15.5)
WBC: 6.4 10*3/uL (ref 4.0–10.5)

## 2014-05-25 LAB — HEPARIN LEVEL (UNFRACTIONATED)
HEPARIN UNFRACTIONATED: 0.26 [IU]/mL — AB (ref 0.30–0.70)
HEPARIN UNFRACTIONATED: 0.33 [IU]/mL (ref 0.30–0.70)

## 2014-05-25 LAB — TROPONIN I: Troponin I: <0.3 ng/mL (ref <0.30)

## 2014-05-25 SURGERY — LEFT HEART CATHETERIZATION WITH CORONARY ANGIOGRAM
Anesthesia: LOCAL

## 2014-05-25 MED ORDER — DOPAMINE-DEXTROSE 3.2-5 MG/ML-% IV SOLN
INTRAVENOUS | Status: AC
  Filled 2014-05-25: qty 250

## 2014-05-25 MED ORDER — INSULIN ASPART 100 UNIT/ML ~~LOC~~ SOLN
0.0000 [IU] | SUBCUTANEOUS | Status: DC
  Administered 2014-05-25 (×2): 3 [IU] via SUBCUTANEOUS

## 2014-05-25 MED ORDER — HEPARIN SODIUM (PORCINE) 1000 UNIT/ML IJ SOLN
INTRAMUSCULAR | Status: AC
  Filled 2014-05-25: qty 1

## 2014-05-25 MED ORDER — INSULIN ASPART 100 UNIT/ML ~~LOC~~ SOLN
0.0000 [IU] | Freq: Three times a day (TID) | SUBCUTANEOUS | Status: DC
  Administered 2014-05-26: 07:00:00 3 [IU] via SUBCUTANEOUS

## 2014-05-25 MED ORDER — NITROGLYCERIN 1 MG/10 ML FOR IR/CATH LAB
INTRA_ARTERIAL | Status: DC
Start: 2014-05-25 — End: 2014-05-25
  Filled 2014-05-25: qty 10

## 2014-05-25 MED ORDER — SODIUM CHLORIDE 0.9 % IV SOLN: INTRAVENOUS | Status: AC

## 2014-05-25 MED ORDER — ONDANSETRON HCL 4 MG/2ML IJ SOLN
INTRAMUSCULAR | Status: AC
  Filled 2014-05-25: qty 2

## 2014-05-25 MED ORDER — ACETAMINOPHEN 325 MG PO TABS
ORAL_TABLET | ORAL | Status: AC
  Filled 2014-05-25: qty 2

## 2014-05-25 MED ORDER — VERAPAMIL HCL 2.5 MG/ML IV SOLN
INTRAVENOUS | Status: AC
  Filled 2014-05-25: qty 2

## 2014-05-25 MED ORDER — ACETAMINOPHEN 325 MG PO TABS
650.0000 mg | ORAL_TABLET | ORAL | Status: DC | PRN
  Administered 2014-05-25: 650 mg via ORAL

## 2014-05-25 MED ORDER — NITROGLYCERIN 0.2 MG/ML ON CALL CATH LAB
INTRAVENOUS | Status: AC
  Filled 2014-05-25: qty 1

## 2014-05-25 MED ORDER — LIDOCAINE HCL (PF) 1 % IJ SOLN
INTRAMUSCULAR | Status: AC
  Filled 2014-05-25: qty 30

## 2014-05-25 MED ORDER — HEPARIN (PORCINE) IN NACL 2-0.9 UNIT/ML-% IJ SOLN
INTRAMUSCULAR | Status: AC
  Filled 2014-05-25: qty 1500

## 2014-05-25 MED ORDER — ONDANSETRON HCL 4 MG/2ML IJ SOLN: 4.0000 mg | Freq: Four times a day (QID) | INTRAMUSCULAR | Status: DC | PRN

## 2014-05-25 NOTE — Progress Notes (Signed)
TR BAND REMOVAL  LOCATION:    right radial  DEFLATED PER PROTOCOL:    Yes.    TIME BAND OFF / DRESSING APPLIED:    19:45   SITE UPON ARRIVAL:    Level 0  SITE AFTER BAND REMOVAL:    Level 0  REVERSE ALLEN'S TEST:     positive  CIRCULATION SENSATION AND MOVEMENT:    Within Normal Limits   Yes.    COMMENTS:

## 2014-05-25 NOTE — Progress Notes (Signed)
ANTICOAGULATION CONSULT NOTE - Follow Up Consult  Pharmacy Consult for Heparin  Indication: chest pain/ACS  No Known Allergies  Patient Measurements: Height: 5\' 8"  (172.7 cm) Weight: 189 lb (85.73 kg) IBW/kg (Calculated) : 68.4 Vital Signs: Temp: 97.8 F (36.6 C) (09/17 2021) Temp src: Oral (09/17 2021) BP: 101/69 mmHg (09/17 2021) Pulse Rate: 59 (09/17 2021)  Labs:  Recent Labs  05/24/14 1419 05/24/14 2057 05/25/14 0237  HGB 14.5  --  13.9  HCT 42.1  --  40.3  PLT 275  --  269  LABPROT  --   --  13.5  INR  --   --  1.03  HEPARINUNFRC  --   --  0.26*  CREATININE 0.73  --   --   TROPONINI  --  <0.30  --     Estimated Creatinine Clearance: 107.2 ml/min (by C-G formula based on Cr of 0.73).  Assessment: Sub-therapeutic heparin level, other labs as above, no issues per RN.   Goal of Therapy:  Heparin level 0.3-0.7 units/ml Monitor platelets by anticoagulation protocol: Yes   Plan:  -Increase heparin drip to 1200 units/hr -1100 HL -Daily CBC/HL -Monitor for bleeding  Zachary Berry 05/25/2014,3:36 AM

## 2014-05-25 NOTE — Progress Notes (Signed)
ANTICOAGULATION CONSULT NOTE - Follow Up Consult  Pharmacy Consult for Heparin Indication: chest pain/ACS  No Known Allergies  Patient Measurements: Height: 5\' 8"  (172.7 cm) Weight: 189 lb (85.73 kg) IBW/kg (Calculated) : 68.4 Heparin Dosing Weight: 85kg  Vital Signs: BP: 104/71 mmHg (09/18 1008)  Labs:  Recent Labs  05/24/14 1419 05/24/14 2057 05/25/14 0237 05/25/14 0940  HGB 14.5  --  13.9  --   HCT 42.1  --  40.3  --   PLT 275  --  269  --   LABPROT  --   --  13.5  --   INR  --   --  1.03  --   HEPARINUNFRC  --   --  0.26* 0.33  CREATININE 0.73  --  0.71  --   TROPONINI  --  <0.30 <0.30  --     Estimated Creatinine Clearance: 107.2 ml/min (by C-G formula based on Cr of 0.71).   Medications:  Heparin @ 1200 units/hr  Assessment: 58yom with significant cardiac history continues on heparin for unstable angina with plans for cath this afternoon. Heparin level is therapeutic. CBC stable. No bleeding reported.  Goal of Therapy:  Heparin level 0.3-0.7 units/ml Monitor platelets by anticoagulation protocol: Yes   Plan:  1) Continue heparin at 1200 units/hr 2) Follow up after cath  Fredrik Rigger 05/25/2014,10:49 AM

## 2014-05-25 NOTE — Interval H&P Note (Signed)
History and Physical Interval Note:  05/25/2014 4:17 PM  Zachary Berry  has presented today for surgery, with the diagnosis of cp  The various methods of treatment have been discussed with the patient and family. After consideration of risks, benefits and other options for treatment, the patient has consented to  Procedure(s): LEFT HEART CATHETERIZATION WITH CORONARY ANGIOGRAM (N/A) as a surgical intervention .  The patient's history has been reviewed, patient examined, no change in status, stable for surgery.  I have reviewed the patient's chart and labs.  Questions were answered to the patient's satisfaction.     Charlot Gouin

## 2014-05-25 NOTE — Progress Notes (Signed)
IVF bolus completed. 0.9NS started at 150cc/hr. Dr. Allean Found notified of low BP. Denies nausea. Skin warm and dry. Alert and oriented. Speech clear and appropriate. Lips pink. No further orders.

## 2014-05-25 NOTE — Progress Notes (Signed)
Cath Lab Visit (complete for each Cath Lab visit)  Clinical Evaluation Leading to the Procedure:   ACS: Yes.

## 2014-05-25 NOTE — Progress Notes (Signed)
    Subjective:  Mild chest pressure. NTG helped. He thinks his LAD stent is restenosed.   Objective:  Vital Signs in the last 24 hours: Temp:  [97.8 F (36.6 C)-98.5 F (36.9 C)] 97.8 F (36.6 C) (09/17 2021) Pulse Rate:  [59-77] 59 (09/17 2021) Resp:  [15-23] 20 (09/17 2021) BP: (93-138)/(58-81) 101/69 mmHg (09/17 2021) SpO2:  [96 %-98 %] 97 % (09/17 2021) Weight:  [189 lb (85.73 kg)] 189 lb (85.73 kg) (09/17 2021)  Intake/Output from previous day: 09/17 0701 - 09/18 0700 In: 243 [P.O.:240; I.V.:3] Out: -    Physical Exam: General: Well developed, well nourished, in no acute distress. Head:  Normocephalic and atraumatic. Lungs: Clear to auscultation and percussion. Heart: Normal S1 and S2.  No murmur, rubs or gallops.  Abdomen: soft, non-tender, positive bowel sounds. Extremities: No clubbing or cyanosis. No edema. Neurologic: Alert and oriented x 3. Talkative. Pleasant.     Lab Results:  Recent Labs  05/24/14 1419 05/25/14 0237  WBC 6.3 6.4  HGB 14.5 13.9  PLT 275 269    Recent Labs  05/24/14 1419 05/25/14 0237  NA 139 140  K 4.4 3.8  CL 100 102  CO2 24 24  GLUCOSE 178* 183*  BUN 14 14  CREATININE 0.73 0.71    Recent Labs  05/24/14 2057 05/25/14 0237  TROPONINI <0.30 <0.30   Imaging: Dg Chest 2 View  05/24/2014   CLINICAL DATA:  Chest pain.  EXAM: CHEST  2 VIEW  COMPARISON:  04/17/2014  FINDINGS: Right-sided dual lead ICD remains in place. Cardiomediastinal silhouette is within normal limits. The lungs are well inflated and clear. No pleural effusion or pneumothorax is identified. Mild thoracic spondylosis is noted.  IMPRESSION: No active cardiopulmonary disease.   Electronically Signed   By: Sebastian Ache   On: 05/24/2014 15:19    Assessment/Plan:  Active Problems:   Unstable angina  58 year old with CAD, [place], cardiomyopathy 35% EF, ICD in 2011, history of SCD.   1) Unstable angina  - cath today 12:30  - IV heparin  DES to prox LAD  -  Cardiac MRI (06/2010): Full thickness scar of the dist ant wall, septum, apex with 2/3 thickness scar of mid and basal ant wall, EF 34%.  - Echo (12/2012): EF 30-35%, anteroseptal hypokinesis, mild LAE   2) CAD  - as above  3) Ischemic cardiomyopathy  - euvolemic  4) DM   - stable  5) HL  - stable    SKAINS, MARK 05/25/2014, 9:20 AM

## 2014-05-25 NOTE — Op Note (Signed)
CARDIAC CATHETERIZATION REPORT   Procedures performed:  1. Left heart catheterization  2. Selective coronary angiography    Reason for procedure:  Unstable angina pectoris  Procedure performed by: Thurmon Fair, MD, [facility]  Complications: none   Estimated blood loss: less than 5 mL   History:  58 year old Caucasian male with past medical history of diabetes, hyperlipidemia, depression, chronic systolic heart failure with baseline EF 30-35%, ischemic cardiomyopathy status post AICD in 2011, history of coronary artery disease status post STEMI in 2010 with drug-eluting stent placed in proximal LAD and history of chronic chest pain since his MI in 2010 presented to [facility] cone doctor starting to have chest pains is reminiscent to his previous anginal symptoms.  Cardiac MRI (06/2010): Full thickness scar of the dist ant wall, septum, apex with 2/3 thickness scar of mid and basal ant wall, EF 34%.  Echo (12/2012): EF 30-35%, anteroseptal hypokinesis, mild LAE Negative enzymes  Consent: The risks, benefits, and details of the procedure were explained to the patient. Risks including death, MI, stroke, bleeding, limb ischemia, renal failure and allergy were described and accepted by the patient. Informed written consent was obtained prior to proceeding.  Technique: The patient was brought to the cardiac catheterization laboratory in the fasting state. He was prepped and draped in the usual sterile fashion. Local anesthesia with 1% lidocaine was administered to the right wrist area. Using the modified Seldinger technique a 5 French right radial artery sheath was introduced without difficulty. Under fluoroscopic guidance, using 5 [place] TIG catheter, selective cannulation of the left coronary artery, right coronary artery and left ventricle were respectively performed. Several coronary angiograms in a variety of projections were recorded, but a left ventriculogram was not performed. Left ventricular  pressure and a pull back to the aorta were recorded. Hypotension accompanied by nausea occured after . At the end of the procedure, all catheters were removed. After the procedure, hemostasis will be achieved with manual pressure.  Contrast used: 50 mL Omnipaque Medications: Lidocaine 1% 5 mL locally, Verapamil 5 mg, NTG 400 mcg intraarterially for radial artery spasm.  Angiographic Findings:  1. The left main coronary artery is free of significant atherosclerosis and bifurcates in the usual fashion into the left anterior descending artery and left circumflex coronary artery.  2. The left anterior descending artery is a large vessel that reaches the apex and generates only on major diagonal branch. There is evidence of extensive  luminal irregularities and minimal calcification. No hemodynamically meaningful stenoses are seen. The proximal LAD stent is widely patent without visible restenosis. 3. The left circumflex coronary artery is a large-size vessel non dominant vessel that generates a very large proximal oblique marginal artery and a much smaller distal OM. There is evidence of mild luminal irregularities and no calcification. No hemodynamically meaningful stenoses are seen. 4. The right coronary artery is a large-size dominant vessel that generates a long posterior lateral ventricular system as well as the PDA. There is evidence of extensive luminal irregularities and minimal calcification. No hemodynamically meaningful stenoses are seen that are >30% in severity.  5. The left ventricle was not injected. There is no aortic valve stenosis by pullback. The left ventricular end-diastolic pressure is 7 mm hg.    IMPRESSIONS:   No significant coronary stenoses. Low LV filling pressures.   RECOMMENDATION:  Monitor overnight. Consider repeat ICD interrogation and evaluation for non cardiac source of chest pain     He developed hypotension after intraarterial verapamil/NTG. IV dopamine used  briefly  to compensate. LVEDP low. Hold carvedilol, furosemide and enalapril tonight. IV fluids for a few hours. Resume CHF meds in AM.

## 2014-05-25 NOTE — Plan of Care (Signed)
Problem: Phase I Progression Outcomes Goal: Hemodynamically stable Outcome: Progressing Patient admitted from [facility] ED for further management of chest pain.  Arrived via stretcher in no apparent distress.  Oriented to room and floor procedure.  Discussed the possibility of cardiac cath with patient.  Denies chest pain at present.  NPO at present for procedure.  Will continue to monitor patient condition.

## 2014-05-26 DIAGNOSIS — I209 Angina pectoris, unspecified: Secondary | ICD-10-CM

## 2014-05-26 LAB — CBC
HEMATOCRIT: 39.9 % (ref 39.0–52.0)
HEMOGLOBIN: 13.6 g/dL (ref 13.0–17.0)
MCH: 30.9 pg (ref 26.0–34.0)
MCHC: 34.1 g/dL (ref 30.0–36.0)
MCV: 90.7 fL (ref 78.0–100.0)
Platelets: 237 10*3/uL (ref 150–400)
RBC: 4.4 MIL/uL (ref 4.22–5.81)
RDW: 13.3 % (ref 11.5–15.5)
WBC: 9.1 10*3/uL (ref 4.0–10.5)

## 2014-05-26 LAB — BASIC METABOLIC PANEL
ANION GAP: 13 (ref 5–15)
BUN: 17 mg/dL (ref 6–23)
CO2: 24 mEq/L (ref 19–32)
Calcium: 8.7 mg/dL (ref 8.4–10.5)
Chloride: 105 mEq/L (ref 96–112)
Creatinine, Ser: 0.61 mg/dL (ref 0.50–1.35)
GFR calc Af Amer: >90 mL/min (ref >90)
Glucose, Bld: 87 mg/dL (ref 70–99)
Potassium: 3.8 mEq/L (ref 3.7–5.3)
Sodium: 142 mEq/L (ref 137–147)

## 2014-05-26 LAB — GLUCOSE, CAPILLARY: Glucose-Capillary: 152 mg/dL — ABNORMAL HIGH (ref 70–99)

## 2014-05-26 MED ORDER — INFLUENZA VAC SPLIT QUAD 0.5 ML IM SUSY: 0.5000 mL | PREFILLED_SYRINGE | INTRAMUSCULAR | Status: DC

## 2014-05-26 NOTE — Discharge Summary (Signed)
Physician Discharge Summary  Patient ID: Zachary Berry MRN: [id number] DOB/AGE: 02-29-1956 58 y.o.  Admit date: 05/24/2014 Discharge date: 05/26/2014  Primary Discharge Diagnosis 1. Chest Pain with known CAD 2. Chronic Systolic CHF EF of 35%  Secondary Discharge Diagnosis: 1. ICM S/P a AICD is in situ  2011 2. PCI to LAD 2010 3. Diabetes 4. Hyperlipidemia 5. Depression  Primary Cardiologist:  1.Marca Ancona MD 2. Hillis Range MD  Significant Diagnostic Studies: 1.Cardiac Cath Angiographic Findings:  1. The left main coronary artery is free of significant atherosclerosis and bifurcates in the usual fashion into the left anterior descending artery and left circumflex coronary artery.  2. The left anterior descending artery is a large vessel that reaches the apex and generates only on major diagonal branch. There is evidence of extensive luminal irregularities and minimal calcification. No hemodynamically meaningful stenoses are seen. The proximal LAD stent is widely patent without visible restenosis.  3. The left circumflex coronary artery is a large-size vessel non dominant vessel that generates a very large proximal oblique marginal artery and a much smaller distal OM. There is evidence of mild luminal irregularities and no calcification. No hemodynamically meaningful stenoses are seen.  4. The right coronary artery is a large-size dominant vessel that generates a long posterior lateral ventricular system as well as the PDA. There is evidence of extensive luminal irregularities and minimal calcification. No hemodynamically meaningful stenoses are seen that are >30% in severity.  5. The left ventricle was not injected. There is no aortic valve stenosis by pullback. The left ventricular end-diastolic pressure is 7 mm hg.   IMPRESSIONS:  No significant coronary stenoses.  Low LV filling pressures.   Hospital Course:    Mr. Gerry is a 58 year old patient with known  history of diabetes, hyperlipidemia, depression, chronic systolic heart failure with baseline EF of 30-35%, ischemic cardiomyopathy, status post AICD in 2001, with history of CAD, status post  [id number] with drug-eluting stent placed to the proximal LAD. He presented to ER with complaints of recurrent chest pain, similar to his previous anginal symptoms. Secondary to symptoms worrisome for unstable angina. The patient was admitted, and started on heparin drip, and plan for cardiac catheterization.    Troponins were cycled and found to be negative x3. Is otherwise essentially unremarkable. His ICD was not interrogated during hospitalization.  Cardiac cath was completed by Dr. Royann Shivers on 05/25/2014. He is found not to have any significant coronary artery stenosis. He did have low LV filling pressures. It was noted during catheterization he developed hypotension after intra-arterial per rectum, melena, nitroglycerin. IV dopamine was used briefly to compensate. His carvedilol furosemide, and Lopressor was held overnight. Resumption of his medications were begin on 05/26/2014.  The patient was seen and examined by Dr. Tenny Craw on day of discharge and found to be stable. The patient was very anxious to return home. He will have a follow up with Dr. Sherlie Ban and continue ongoing pacemaker interrogations group pacemaker clinic and up limits with Dr. Johney Frame. Prescriptions were resumed on discharge.    Discharge Exam: Blood pressure 94/56, pulse 61, temperature 97.8 F (36.6 C), temperature source Oral, resp. rate 18, height 5\' 8"  (1.727 m), weight 190 lb 0.6 oz (86.2 kg), SpO2 92.00%.    Labs:   Lab Results  Component Value Date   WBC 9.1 05/26/2014   HGB 13.6 05/26/2014   HCT 39.9 05/26/2014   MCV 90.7 05/26/2014   PLT 237 05/26/2014    Recent Labs  Lab 05/26/14 0403  NA 142  K 3.8  CL 105  CO2 24  BUN 17  CREATININE 0.61  CALCIUM 8.7  GLUCOSE 87   Lab Results  Component Value Date   CKTOTAL  2819* 04/04/2009   CKMB 181.1* 04/04/2009   TROPONINI <0.30 05/25/2014    Lab Results  Component Value Date   CHOL 107 05/25/2014   CHOL 83 07/22/2011   CHOL 92 11/25/2009   Lab Results  Component Value Date   HDL 41 05/25/2014   HDL 35.20* 07/22/2011   HDL 44.00 11/25/2009   Lab Results  Component Value Date   LDLCALC 16 05/25/2014   LDLCALC 8 07/22/2011   LDLCALC 29 11/25/2009   Lab Results  Component Value Date   TRIG 252* 05/25/2014   TRIG 199.0* 07/22/2011   TRIG 96.0 11/25/2009   Lab Results  Component Value Date   CHOLHDL 2.6 05/25/2014   CHOLHDL 2 07/22/2011   CHOLHDL 2 11/25/2009   No results found for this basename: LDLDIRECT      Radiology: Dg Chest 2 View  05/24/2014   CLINICAL DATA:  Chest pain.  EXAM: CHEST  2 VIEW  COMPARISON:  04/17/2014  FINDINGS: Right-sided dual lead ICD remains in place. Cardiomediastinal silhouette is within normal limits. The lungs are well inflated and clear. No pleural effusion or pneumothorax is identified. Mild thoracic spondylosis is noted.  IMPRESSION: No active cardiopulmonary disease.   Electronically Signed   By: Sebastian Ache   On: 05/24/2014 15:19    EKG: Atrial paced rhythm, rate of 60 beats per minute.   FOLLOW UP PLANS AND APPOINTMENTS Discharge Instructions   Diet - low sodium heart healthy    Complete by:  As directed      Increase activity slowly    Complete by:  As directed             Medication List         aspirin EC 81 MG tablet  Take 81 mg by mouth daily.     carvedilol 25 MG tablet  Commonly known as:  COREG  Take 1 tablet (25 mg total) by mouth 2 (two) times daily.     clopidogrel 75 MG tablet  Commonly known as:  PLAVIX  Take 1 tablet (75 mg total) by mouth daily.     enalapril 20 MG tablet  Commonly known as:  VASOTEC  Take 1 tablet (20 mg total) by mouth 2 (two) times daily.     fenofibrate 160 MG tablet  Take 160 mg by mouth daily.     furosemide 40 MG tablet  Commonly known as:  LASIX    Take 40 mg by mouth daily.     glipiZIDE 10 MG tablet  Commonly known as:  GLUCOTROL  Take 10 mg by mouth 2 (two) times daily before a meal.     insulin glargine 100 UNIT/ML injection  Commonly known as:  LANTUS  Inject 45 Units into the skin at bedtime as needed (for blood sugar).     INVOKAMET [id number] MG Tabs  Generic drug:  Canagliflozin-Metformin HCl  Take 1 tablet by mouth daily.     nitroGLYCERIN 0.4 MG SL tablet  Commonly known as:  NITROSTAT  Place 0.4 mg under the tongue every 5 (five) minutes as needed for chest pain.     NON FORMULARY  1 each by Other route See admin instructions. Use CPAP machine nightly.     rosuvastatin 40 MG tablet  Commonly known as:  CRESTOR  Take 1 tablet (40 mg total) by mouth daily.     sertraline 100 MG tablet  Commonly known as:  ZOLOFT  Take 100 mg by mouth 2 (two) times daily.     spironolactone 25 MG tablet  Commonly known as:  ALDACTONE  Take 1 tablet (25 mg total) by mouth daily.           Follow-up Information   Follow up with Marca Ancona, MD. (Our office will call you for appt. )    Specialty:  Cardiology   Contact information:   [address] [place] [address] [phone]         Time spent with patient to include physician time: 30 minutes Signed: Bettey Mare. Lawrence NP  05/26/2014, 9:15 AM  Agree with plans for d/c

## 2014-05-26 NOTE — Progress Notes (Signed)
Subjective: No CP Objective: Filed Vitals:   05/26/14 0011 05/26/14 0406 05/26/14 0500 05/26/14 0723  BP: 90/55 91/55 95/59  94/56  Pulse: 60 66 60 61  Temp: 97.9 F (36.6 C) 98.6 F (37 C)  97.8 F (36.6 C)  TempSrc: Oral Oral  Oral  Resp: 18 18  18   Height:      Weight: 190 lb 0.6 oz (86.2 kg)     SpO2: 95% 91% 91% 92%   Weight change: 1 lb 0.6 oz (0.47 kg)  Intake/Output Summary (Last 24 hours) at 05/26/14 0824 Last data filed at 05/26/14 0700  Gross per 24 hour  Intake   1065 ml  Output   1500 ml  Net   -435 ml    General: Alert, awake, oriented x3, in no acute distress Neck:  JVP is normal Heart: Regular rate and rhythm, without murmurs, rubs, gallops.  Lungs: Clear to auscultation.  No rales or wheezes. Exemities:  No edema.   Neuro: Grossly intact, nonfocal.  Tele:  Paced   Lab Results: Results for orders placed during the hospital encounter of 05/24/14 (from the past 24 hour(s))  TROPONIN I     Status: None   Collection Time    05/25/14  9:40 AM      Result Value Ref Range   Troponin I <0.30  <0.30 ng/mL  HEPARIN LEVEL (UNFRACTIONATED)     Status: None   Collection Time    05/25/14  9:40 AM      Result Value Ref Range   Heparin Unfractionated 0.33  0.30 - 0.70 IU/mL  GLUCOSE, CAPILLARY     Status: Abnormal   Collection Time    05/25/14 11:39 AM      Result Value Ref Range   Glucose-Capillary 138 (*) 70 - 99 mg/dL  GLUCOSE, CAPILLARY     Status: Abnormal   Collection Time    05/25/14  5:55 PM      Result Value Ref Range   Glucose-Capillary 167 (*) 70 - 99 mg/dL  GLUCOSE, CAPILLARY     Status: Abnormal   Collection Time    05/25/14  9:01 PM      Result Value Ref Range   Glucose-Capillary 181 (*) 70 - 99 mg/dL   Comment 1 Notify RN    CBC     Status: None   Collection Time    05/26/14  4:03 AM      Result Value Ref Range   WBC 9.1  4.0 - 10.5 K/uL   RBC 4.40  4.22 - 5.81 MIL/uL   Hemoglobin 13.6  13.0 - 17.0 g/dL   HCT 04.5  99.7 - 74.1 %     MCV 90.7  78.0 - 100.0 fL   MCH 30.9  26.0 - 34.0 pg   MCHC 34.1  30.0 - 36.0 g/dL   RDW 42.3  95.3 - 20.2 %   Platelets 237  150 - 400 K/uL  BASIC METABOLIC PANEL     Status: None   Collection Time    05/26/14  4:03 AM      Result Value Ref Range   Sodium 142  137 - 147 mEq/L   Potassium 3.8  3.7 - 5.3 mEq/L   Chloride 105  96 - 112 mEq/L   CO2 24  19 - 32 mEq/L   Glucose, Bld 87  70 - 99 mg/dL   BUN 17  6 - 23 mg/dL   Creatinine, Ser 3.34  0.50 - 1.35 mg/dL  Calcium 8.7  8.4 - 10.5 mg/dL   GFR calc non Af Amer >90  >90 mL/min   GFR calc Af Amer >90  >90 mL/min   Anion gap 13  5 - 15  GLUCOSE, CAPILLARY     Status: Abnormal   Collection Time    05/26/14  6:42 AM      Result Value Ref Range   Glucose-Capillary 152 (*) 70 - 99 mg/dL   Comment 1 Notify RN      Studies/Results: No results found.  Medications:REviewed   @  1.  CP    patient underwent cath yesterday:  LM No signif dz.; LAD:  Irregularities  Stent patent; LCx.  Irreg; RCA:  Irregularities    LVEDP 7.   Not clear what lead to CP  Now gone  Follow as outpaitnet   Needs new Rx for NTG   2.  HL  Continue current meds.   3.  Chronic systolic CHF  LVEF 30 to 35%  Low filling pressures.  4.  AICD      LOS: 2 days   Dietrich Pates 05/26/2014, 8:24 AM

## 2014-05-26 NOTE — Progress Notes (Signed)
Pt ambulated 600 ft, no c/o of CP or SOB

## 2014-06-21 ENCOUNTER — Encounter: Payer: Medicare HMO | Admitting: Physician Assistant

## 2014-06-27 ENCOUNTER — Ambulatory Visit (INDEPENDENT_AMBULATORY_CARE_PROVIDER_SITE_OTHER): Payer: Commercial Managed Care - HMO | Admitting: *Deleted

## 2014-06-27 ENCOUNTER — Encounter: Payer: Self-pay | Admitting: Internal Medicine

## 2014-06-27 DIAGNOSIS — I5022 Chronic systolic (congestive) heart failure: Secondary | ICD-10-CM

## 2014-06-27 DIAGNOSIS — I429 Cardiomyopathy, unspecified: Secondary | ICD-10-CM

## 2014-06-27 NOTE — Progress Notes (Signed)
Remote ICD transmission.   

## 2014-06-29 LAB — MDC_IDC_ENUM_SESS_TYPE_REMOTE
Battery Voltage: 3.07 V
Brady Statistic RA Percent Paced: 37.26 %
Date Time Interrogation Session: 20151021113843
HIGH POWER IMPEDANCE MEASURED VALUE: 399 Ohm
HighPow Impedance: 45 Ohm
HighPow Impedance: 52 Ohm
Lead Channel Impedance Value: 456 Ohm
Lead Channel Impedance Value: 513 Ohm
Lead Channel Pacing Threshold Amplitude: 0.75 V
Lead Channel Pacing Threshold Pulse Width: 0.4 ms
Lead Channel Sensing Intrinsic Amplitude: 14.375 mV
Lead Channel Sensing Intrinsic Amplitude: 3.25 mV
Lead Channel Sensing Intrinsic Amplitude: 3.25 mV
Lead Channel Setting Sensing Sensitivity: 0.3 mV
MDC IDC MSMT LEADCHNL RV PACING THRESHOLD AMPLITUDE: 0.625 V
MDC IDC MSMT LEADCHNL RV PACING THRESHOLD PULSEWIDTH: 0.4 ms
MDC IDC MSMT LEADCHNL RV SENSING INTR AMPL: 14.375 mV
MDC IDC SET LEADCHNL RA PACING AMPLITUDE: 2 V
MDC IDC SET LEADCHNL RV PACING AMPLITUDE: 2.5 V
MDC IDC SET LEADCHNL RV PACING PULSEWIDTH: 0.4 ms
MDC IDC SET ZONE DETECTION INTERVAL: 300 ms
MDC IDC SET ZONE DETECTION INTERVAL: 350 ms
MDC IDC SET ZONE DETECTION INTERVAL: 360 ms
MDC IDC STAT BRADY AP VP PERCENT: 0.02 %
MDC IDC STAT BRADY AP VS PERCENT: 37.24 %
MDC IDC STAT BRADY AS VP PERCENT: 0.01 %
MDC IDC STAT BRADY AS VS PERCENT: 62.73 %
MDC IDC STAT BRADY RV PERCENT PACED: 0.03 %
Zone Setting Detection Interval: 380 ms

## 2014-07-11 ENCOUNTER — Encounter: Payer: Self-pay | Admitting: Cardiology

## 2014-07-30 ENCOUNTER — Ambulatory Visit (INDEPENDENT_AMBULATORY_CARE_PROVIDER_SITE_OTHER): Payer: Commercial Managed Care - HMO | Admitting: Physician Assistant

## 2014-07-30 ENCOUNTER — Encounter: Payer: Self-pay | Admitting: Physician Assistant

## 2014-07-30 VITALS — BP 131/79 | HR 64 | Ht 68.0 in | Wt 182.0 lb

## 2014-07-30 DIAGNOSIS — I5022 Chronic systolic (congestive) heart failure: Secondary | ICD-10-CM

## 2014-07-30 DIAGNOSIS — I2510 Atherosclerotic heart disease of native coronary artery without angina pectoris: Secondary | ICD-10-CM

## 2014-07-30 DIAGNOSIS — I10 Essential (primary) hypertension: Secondary | ICD-10-CM

## 2014-07-30 DIAGNOSIS — I255 Ischemic cardiomyopathy: Secondary | ICD-10-CM

## 2014-07-30 DIAGNOSIS — E785 Hyperlipidemia, unspecified: Secondary | ICD-10-CM

## 2014-07-30 NOTE — Patient Instructions (Addendum)
Continue your current medications.  Schedule follow up with Dr. Marca Ancona in 6 mos.

## 2014-07-30 NOTE — Progress Notes (Signed)
Zachary Berry, would probably have him see me in CHF clinic.

## 2014-07-30 NOTE — Progress Notes (Signed)
Cardiology Office Note   Date:  07/30/2014   ID:  Zachary PersonCharles R Schlosser, DOB 19-Dec-1955, MRN [id number]  PCP:  Tanna FurryHOUT, BRITTANY, PA-C  Cardiologist: Dr. Marca Anconaalton McLean  Electrophysiologist: Dr. Hillis RangeJames Allred   History of Present Illness: Zachary Berry is a 58 y.o. male with a hx of CAD s/p ant MI in 7/10 tx with DES to LAD, ischemic cardiomyopathy, systolic CHF, s/p AICD, diabetes, HL, depression. He has chronic chest pain since his MI in 2010.   [facility] with syncope. Blood pressure was 90/62 upon admission. Interrogation of his device demonstrated no ventricular arrhythmias. His diuretics were reduced. Admitted 05/2014 with chest pain concerning for unstable angina. Cardiac enzymes remained negative. LHC demonstrated patent LAD stent. Continued medical therapy was recommended.  He returns for FU.  He has overall been doing well.  He has chronic chest pain. This has been ongoing since his myocardial infarction. He denies significant change. He sleeps on 2 pillows chronically. He sleeps with CPAP. He denies LE edema. He notes chronic dyspnea and exertion. He is NYHA 2b. He denies syncope.   Studies: - LHC (04/03/2009): EF 30%, proximal LAD occluded >>> PCI: Xience DES to prox LAD.  - LHC (9/15): Proximal LAD stent patent - Echo (12/2012): EF 30-35%, anteroseptal hypokinesis, mild LAE - Cardiac MRI (06/2010): Full thickness scar of the dist ant wall, septum, apex with 2/3 thickness scar of mid and basal ant wall, EF 34%.  Recent Labs/Images:  05/24/2014: Pro B Natriuretic peptide (BNP) 56.3 05/25/2014: LDL (calc) 16 05/26/2014: BUN 17; Creatinine 0.61; Hemoglobin 13.6; Potassium 3.8; Sodium 142     - Dg Chest 2 View  05/24/2014   CLINICAL DATA:    IMPRESSION: No active cardiopulmonary disease.   Electronically Signed   By: Sebastian AcheAllen  Grady   On: 05/24/2014 15:19     Wt Readings from Last 3 Encounters:  07/30/14 182 lb (82.555 kg)  05/26/14 190 lb  0.6 oz (86.2 kg)  03/26/14 194 lb (87.998 kg)     Past Medical History  Diagnosis Date  . CAD (coronary artery disease)     a. s/p Ant STEMI >> LHC (04/03/2009): EF 30%, proximal LAD occluded >>> PCI: Xience DES to prox LAD.;  b.  LHC (9/15): Proximal LAD stent patent  . Diabetes mellitus   . Learning disability   . Hyperlipidemia   . Poor vision   . Ischemic cardiomyopathy     a.  Echo (12/2012): EF 30-35%, anteroseptal hypokinesis, mild LAE;   b.  Cardiac MRI (06/2010): Full thickness scar of the dist ant wall, septum, apex with 2/3 thickness scar of mid and basal ant wall, EF 34%.  . Depression   . Chronic systolic heart failure   . HTN (hypertension)   . S/P implantation of automatic cardioverter/defibrillator (AICD)     Current Outpatient Prescriptions  Medication Sig Dispense Refill  . aspirin EC 81 MG tablet Take 81 mg by mouth daily.    . Canagliflozin-Metformin HCl (INVOKAMET) [phone] MG TABS Take 1 tablet by mouth daily.     . carvedilol (COREG) 25 MG tablet Take 1 tablet (25 mg total) by mouth 2 (two) times daily. 60 tablet 6  . clopidogrel (PLAVIX) 75 MG tablet Take 1 tablet (75 mg total) by mouth daily. 90 tablet 1  . enalapril (VASOTEC) 20 MG tablet Take 1 tablet (20 mg total) by mouth 2 (two) times daily. 60 tablet 0  . fenofibrate 160 MG tablet Take 160  mg by mouth daily.    . furosemide (LASIX) 40 MG tablet Take 40 mg by mouth daily.    Marland Kitchen glipiZIDE (GLUCOTROL) 10 MG tablet Take 10 mg by mouth 2 (two) times daily before a meal.      . insulin glargine (LANTUS) 100 UNIT/ML injection Inject 45 Units into the skin at bedtime as needed (for blood sugar).     . nitroGLYCERIN (NITROSTAT) 0.4 MG SL tablet Place 0.4 mg under the tongue every 5 (five) minutes as needed for chest pain.     . NON FORMULARY 1 each by Other route See admin instructions. Use CPAP machine nightly.    . rosuvastatin (CRESTOR) 40 MG tablet Take 1 tablet (40 mg total) by mouth daily. 30 tablet 0  .  sertraline (ZOLOFT) 100 MG tablet Take 100 mg by mouth 2 (two) times daily.     Marland Kitchen spironolactone (ALDACTONE) 25 MG tablet Take 1 tablet (25 mg total) by mouth daily. 30 tablet 6   No current facility-administered medications for this visit.     Allergies:   Review of patient's allergies indicates no known allergies.   Social History:  The patient  reports that he has never smoked. He has never used smokeless tobacco. He reports that he does not drink alcohol or use illicit drugs.   Family History:  The patient's family history includes Diabetes in his brother, father, mother, and sister; Heart attack (age of onset: 53) in his brother; Heart disease in his mother; Heart failure in his mother; Hypertension in his father; Stomach cancer in an other family member; Stroke in his mother; Sudden death in his brother.   ROS:  Please see the history of present illness.      All other systems reviewed and negative.    PHYSICAL EXAM: VS:  BP 131/79 mmHg  Pulse 64  Ht 5\' 8"  (1.727 m)  Wt 182 lb (82.555 kg)  BMI 27.68 kg/m2 Well nourished, well developed, in no acute distress HEENT: normal Neck: no JVD Cardiac:  normal S1, S2;  RRR; no murmur   Lungs:   clear to auscultation bilaterally, no wheezing, rhonchi or rales Abd: soft, nontender, no hepatomegaly Ext: right wrist without hematoma or mass no edema Skin: warm and dry Neuro:  CNs 2-12 intact, no focal abnormalities noted  EKG:  NSR, HR 64, normal axis, nonspecific ST-T wave changes, no change from prior tracing      ASSESSMENT AND PLAN:  1.  Coronary Artery Disease:  He has chronic chest pain.  He denies any recurrent symptoms like what he had that prompted his recent admission.  Continue aspirin, Plavix, beta blocker, statin. 2.  Ischemic cardiomyopathy:  Continue beta blocker, ACE inhibitor, spironolactone.  05/26/2014: Creatinine 0.61; Potassium 3.8  3.  Chronic systolic heart failure:  Volume stable. 4.  Essential hypertension:   Controlled.  5.  HLD (hyperlipidemia):  05/25/2014: HDL Cholesterol by NMR 41; LDL (calc) 16  Continue statin.  Disposition:   FU with Dr. Marca Ancona 6 mos.   Signed, Brynda Rim, MHS 07/30/2014 10:43 AM    [facility] [address] [place], [place], [place]  [address] Phone: [phone]; Fax: [phone]

## 2014-08-16 ENCOUNTER — Encounter (HOSPITAL_COMMUNITY): Payer: Self-pay | Admitting: Cardiovascular Disease

## 2014-09-05 ENCOUNTER — Other Ambulatory Visit: Payer: Self-pay | Admitting: Cardiology

## 2014-09-05 MED ORDER — CARVEDILOL 25 MG PO TABS: 25.0000 mg | ORAL_TABLET | Freq: Two times a day (BID) | ORAL | Status: DC

## 2014-09-07 DIAGNOSIS — I10 Essential (primary) hypertension: Secondary | ICD-10-CM | POA: Diagnosis not present

## 2014-09-07 DIAGNOSIS — I2510 Atherosclerotic heart disease of native coronary artery without angina pectoris: Secondary | ICD-10-CM | POA: Diagnosis not present

## 2014-09-07 DIAGNOSIS — E1165 Type 2 diabetes mellitus with hyperglycemia: Secondary | ICD-10-CM | POA: Diagnosis not present

## 2014-09-07 DIAGNOSIS — I255 Ischemic cardiomyopathy: Secondary | ICD-10-CM | POA: Diagnosis not present

## 2014-09-07 DIAGNOSIS — E785 Hyperlipidemia, unspecified: Secondary | ICD-10-CM | POA: Diagnosis not present

## 2014-09-22 IMAGING — CR DG CHEST 2V
2 series · 2 of 2 positions shown · non-contrast
Comparison: 04/17/2014

CLINICAL DATA: Chest pain.

EXAM:
CHEST  2 VIEW

[w chest pa]
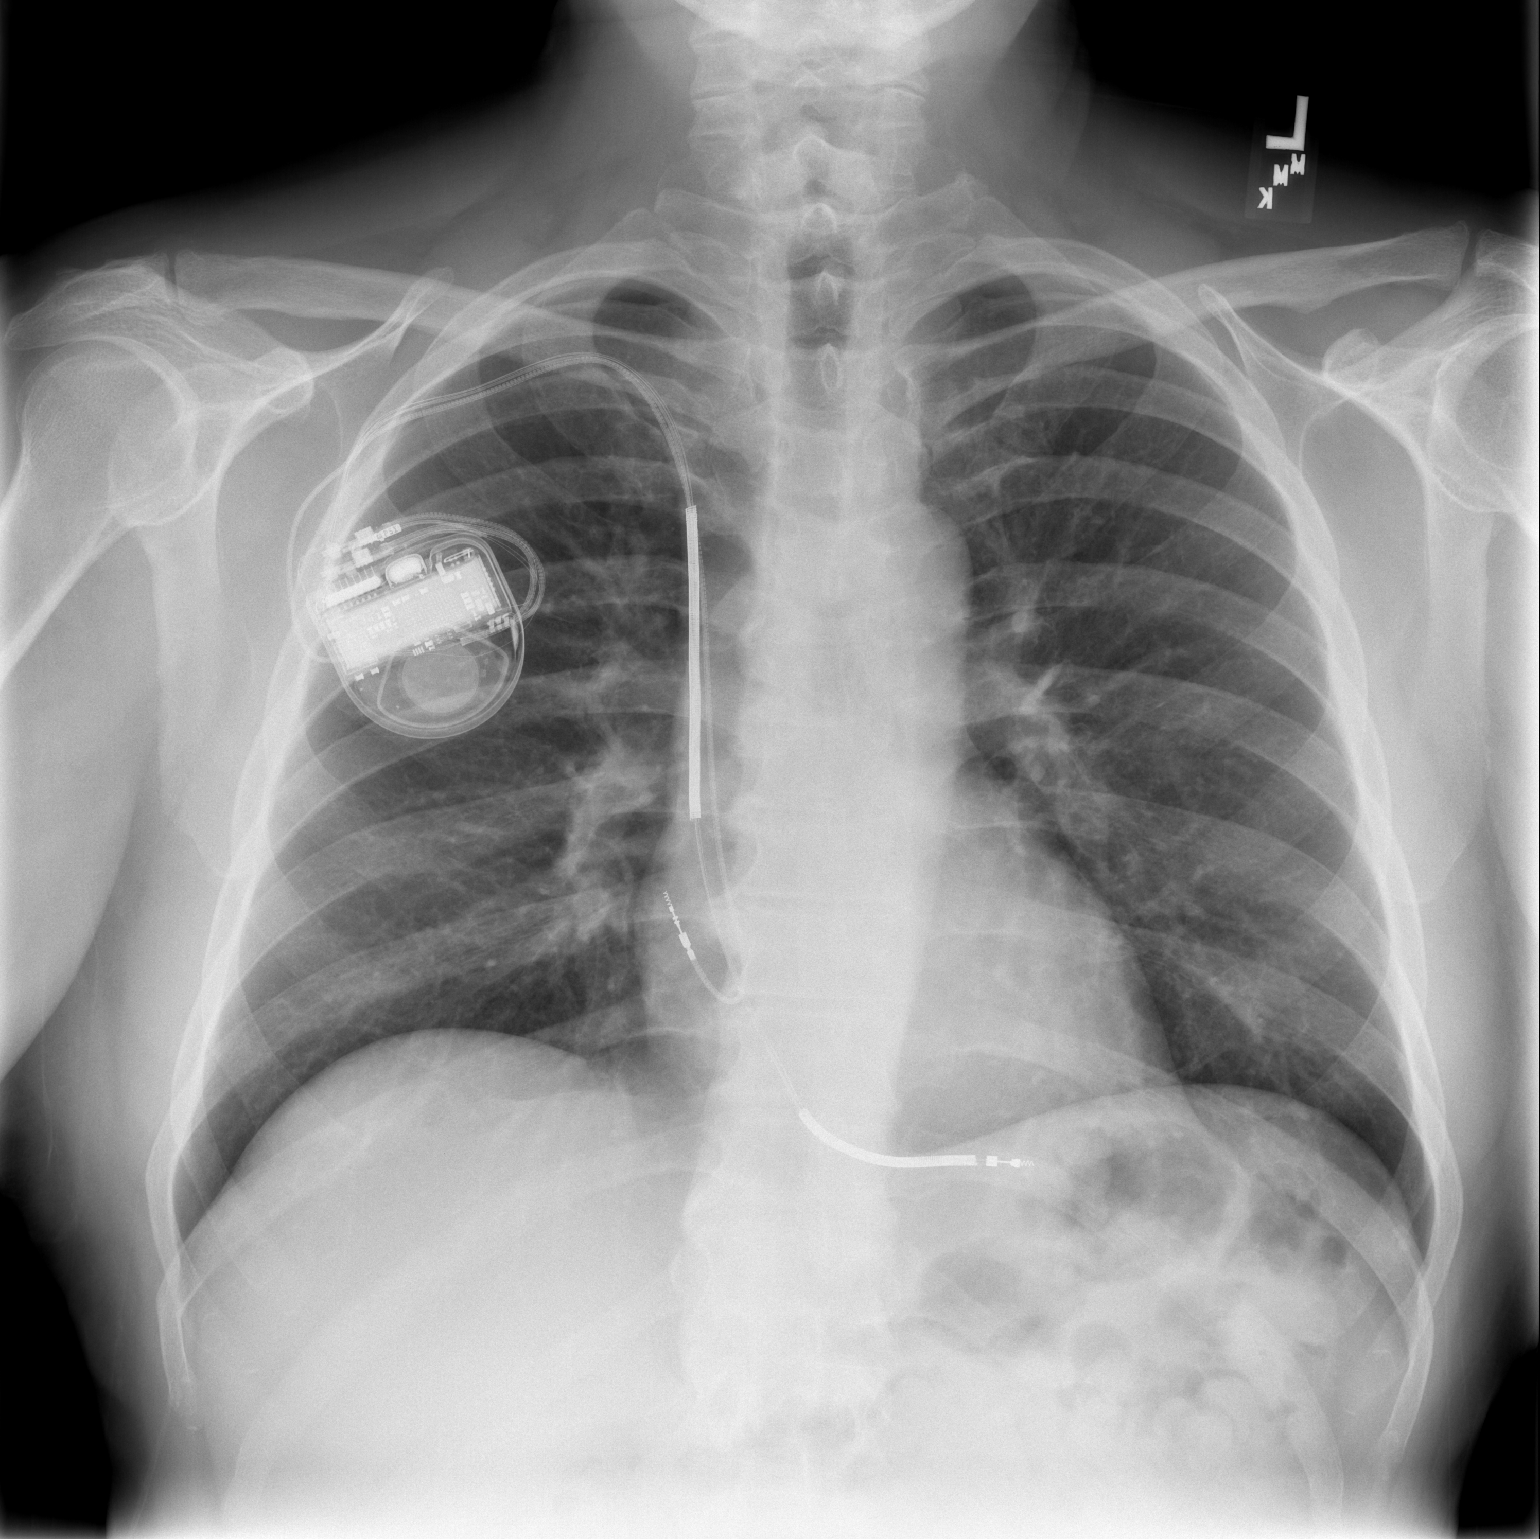

[w chest lat]
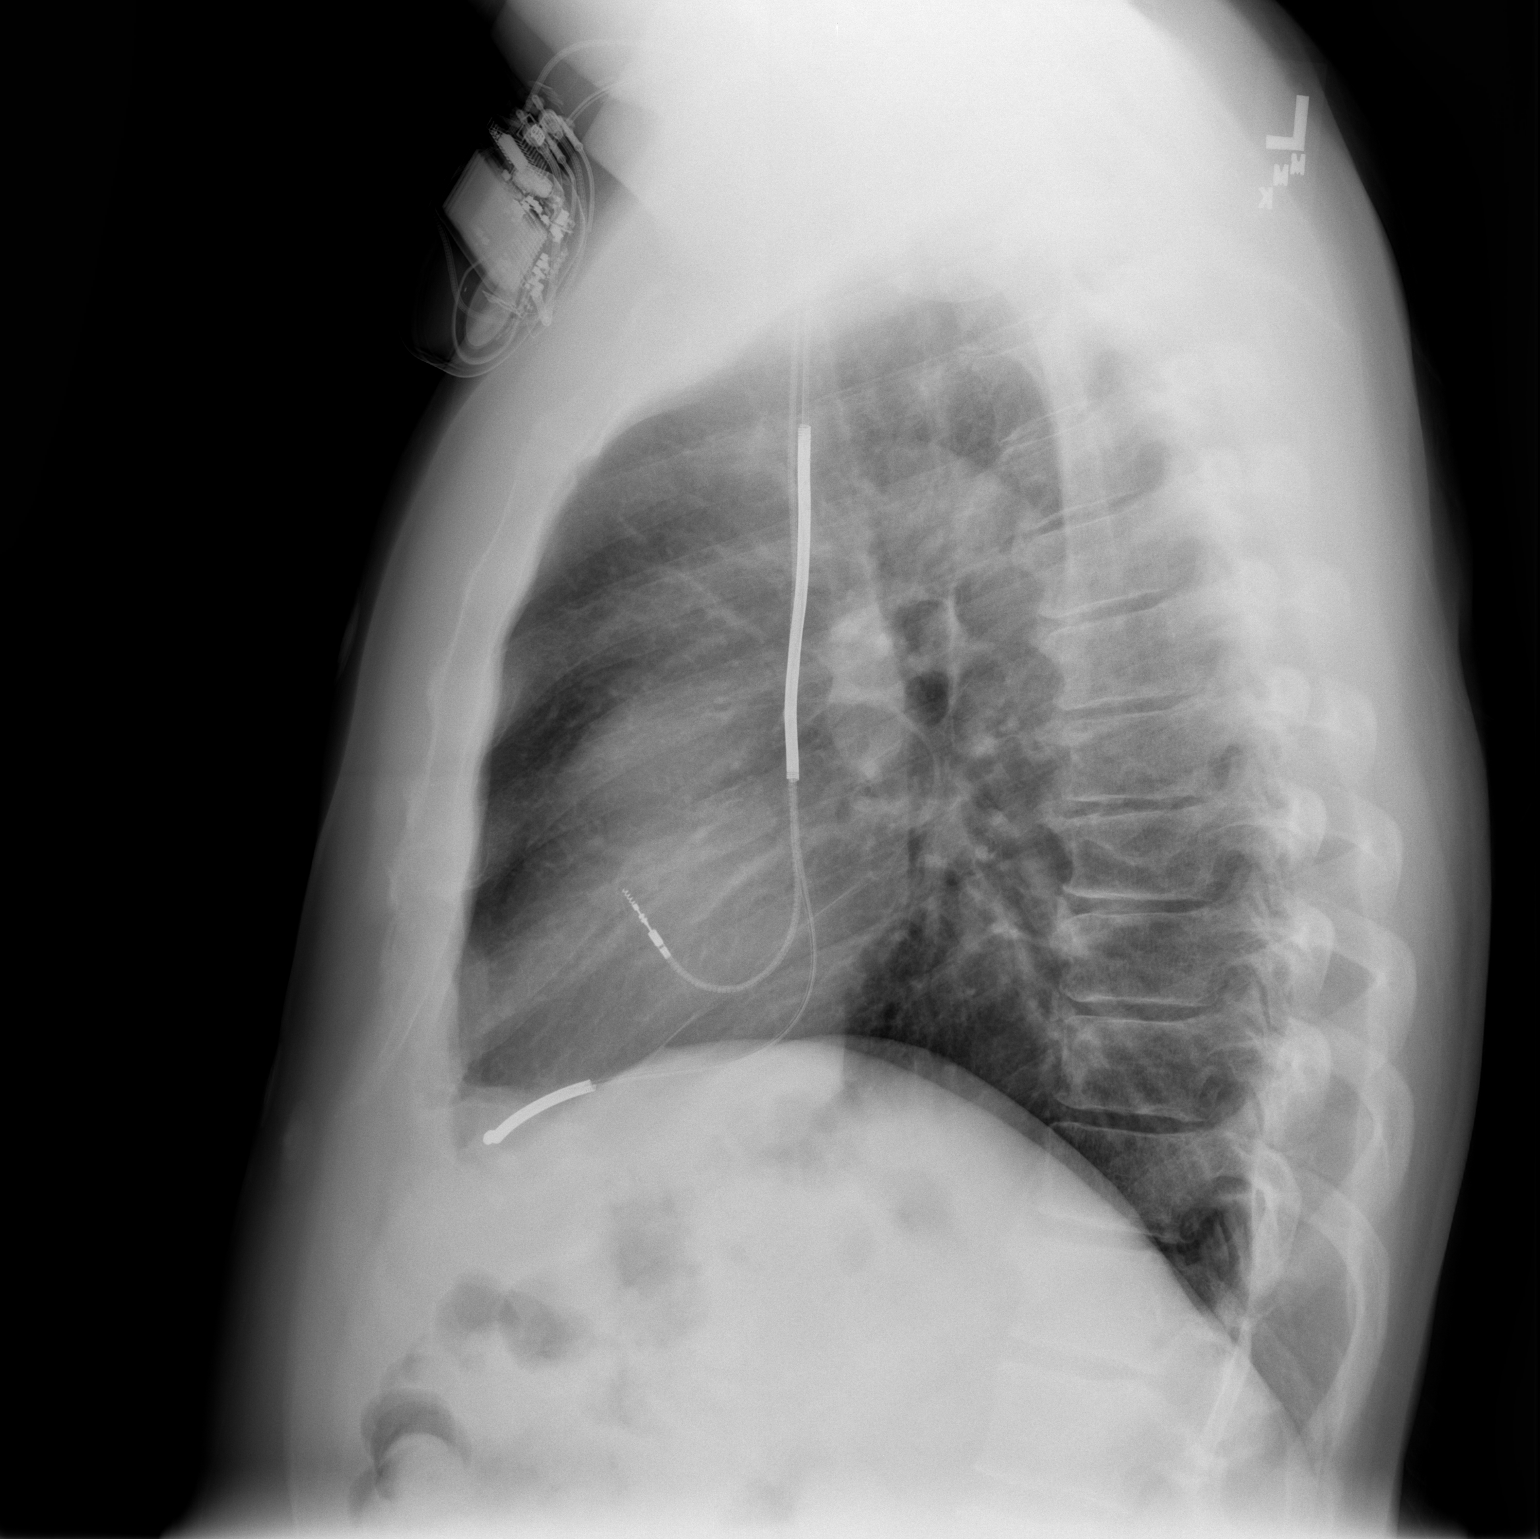

[2 of 2 positions shown; findings below may reference images not displayed]

FINDINGS: Right-sided dual lead ICD remains in place. Cardiomediastinal
silhouette is within normal limits. The lungs are well inflated and
clear. No pleural effusion or pneumothorax is identified. Mild
thoracic spondylosis is noted.
IMPRESSION: No active cardiopulmonary disease.

## 2014-10-01 ENCOUNTER — Ambulatory Visit (INDEPENDENT_AMBULATORY_CARE_PROVIDER_SITE_OTHER): Payer: Commercial Managed Care - HMO | Admitting: *Deleted

## 2014-10-01 ENCOUNTER — Encounter: Payer: Self-pay | Admitting: Internal Medicine

## 2014-10-01 DIAGNOSIS — I5022 Chronic systolic (congestive) heart failure: Secondary | ICD-10-CM | POA: Diagnosis not present

## 2014-10-01 DIAGNOSIS — I255 Ischemic cardiomyopathy: Secondary | ICD-10-CM

## 2014-10-01 LAB — MDC_IDC_ENUM_SESS_TYPE_REMOTE
Brady Statistic AP VP Percent: 0.02 %
Brady Statistic RV Percent Paced: 0.04 %
Date Time Interrogation Session: 20160125174229
HighPow Impedance: 399 Ohm
HighPow Impedance: 51 Ohm
HighPow Impedance: 62 Ohm
Lead Channel Impedance Value: 494 Ohm
Lead Channel Impedance Value: 513 Ohm
Lead Channel Pacing Threshold Pulse Width: 0.4 ms
Lead Channel Setting Pacing Amplitude: 2.5 V
Lead Channel Setting Pacing Pulse Width: 0.4 ms
Lead Channel Setting Sensing Sensitivity: 0.3 mV
MDC IDC MSMT BATTERY VOLTAGE: 3.06 V
MDC IDC MSMT LEADCHNL RA PACING THRESHOLD AMPLITUDE: 0.75 V
MDC IDC MSMT LEADCHNL RA PACING THRESHOLD PULSEWIDTH: 0.4 ms
MDC IDC MSMT LEADCHNL RA SENSING INTR AMPL: 3.125 mV
MDC IDC MSMT LEADCHNL RV PACING THRESHOLD AMPLITUDE: 0.625 V
MDC IDC MSMT LEADCHNL RV SENSING INTR AMPL: 16 mV
MDC IDC SET LEADCHNL RA PACING AMPLITUDE: 2 V
MDC IDC SET ZONE DETECTION INTERVAL: 350 ms
MDC IDC SET ZONE DETECTION INTERVAL: 360 ms
MDC IDC SET ZONE DETECTION INTERVAL: 380 ms
MDC IDC STAT BRADY AP VS PERCENT: 21.3 %
MDC IDC STAT BRADY AS VP PERCENT: 0.02 %
MDC IDC STAT BRADY AS VS PERCENT: 78.66 %
MDC IDC STAT BRADY RA PERCENT PACED: 21.32 %
Zone Setting Detection Interval: 300 ms

## 2014-10-01 NOTE — Progress Notes (Signed)
Remote ICD transmission.   

## 2014-10-10 ENCOUNTER — Encounter: Payer: Self-pay | Admitting: Cardiology

## 2014-12-07 DIAGNOSIS — I2510 Atherosclerotic heart disease of native coronary artery without angina pectoris: Secondary | ICD-10-CM | POA: Diagnosis not present

## 2014-12-07 DIAGNOSIS — Z6827 Body mass index (BMI) 27.0-27.9, adult: Secondary | ICD-10-CM | POA: Diagnosis not present

## 2014-12-07 DIAGNOSIS — I10 Essential (primary) hypertension: Secondary | ICD-10-CM | POA: Diagnosis not present

## 2014-12-07 DIAGNOSIS — E785 Hyperlipidemia, unspecified: Secondary | ICD-10-CM | POA: Diagnosis not present

## 2014-12-07 DIAGNOSIS — E1165 Type 2 diabetes mellitus with hyperglycemia: Secondary | ICD-10-CM | POA: Diagnosis not present

## 2015-01-01 ENCOUNTER — Ambulatory Visit (INDEPENDENT_AMBULATORY_CARE_PROVIDER_SITE_OTHER): Payer: Commercial Managed Care - HMO | Admitting: *Deleted

## 2015-01-01 ENCOUNTER — Encounter: Payer: Self-pay | Admitting: Internal Medicine

## 2015-01-01 DIAGNOSIS — I255 Ischemic cardiomyopathy: Secondary | ICD-10-CM

## 2015-01-01 DIAGNOSIS — I5022 Chronic systolic (congestive) heart failure: Secondary | ICD-10-CM | POA: Diagnosis not present

## 2015-01-01 LAB — MDC_IDC_ENUM_SESS_TYPE_REMOTE
Battery Voltage: 3.05 V
Brady Statistic AP VP Percent: 0.02 %
Brady Statistic RV Percent Paced: 0.04 %
Date Time Interrogation Session: 20160426154943
HIGH POWER IMPEDANCE MEASURED VALUE: 56 Ohm
HIGH POWER IMPEDANCE MEASURED VALUE: 67 Ohm
HighPow Impedance: 437 Ohm
Lead Channel Impedance Value: 532 Ohm
Lead Channel Pacing Threshold Amplitude: 0.625 V
Lead Channel Pacing Threshold Amplitude: 0.75 V
Lead Channel Pacing Threshold Pulse Width: 0.4 ms
Lead Channel Sensing Intrinsic Amplitude: 15.75 mV
Lead Channel Sensing Intrinsic Amplitude: 4 mV
Lead Channel Setting Pacing Pulse Width: 0.4 ms
Lead Channel Setting Sensing Sensitivity: 0.3 mV
MDC IDC MSMT LEADCHNL RA PACING THRESHOLD PULSEWIDTH: 0.4 ms
MDC IDC MSMT LEADCHNL RV IMPEDANCE VALUE: 513 Ohm
MDC IDC SET LEADCHNL RA PACING AMPLITUDE: 2 V
MDC IDC SET LEADCHNL RV PACING AMPLITUDE: 2.5 V
MDC IDC SET ZONE DETECTION INTERVAL: 360 ms
MDC IDC STAT BRADY AP VS PERCENT: 26.2 %
MDC IDC STAT BRADY AS VP PERCENT: 0.02 %
MDC IDC STAT BRADY AS VS PERCENT: 73.76 %
MDC IDC STAT BRADY RA PERCENT PACED: 26.22 %
Zone Setting Detection Interval: 300 ms
Zone Setting Detection Interval: 350 ms
Zone Setting Detection Interval: 380 ms

## 2015-01-01 NOTE — Progress Notes (Signed)
Remote ICD transmission.   

## 2015-01-08 ENCOUNTER — Encounter: Payer: Self-pay | Admitting: Cardiology

## 2015-01-18 ENCOUNTER — Other Ambulatory Visit: Payer: Self-pay

## 2015-01-18 MED ORDER — FUROSEMIDE 40 MG PO TABS: 40.0000 mg | ORAL_TABLET | Freq: Every day | ORAL | Status: DC

## 2015-02-22 ENCOUNTER — Other Ambulatory Visit: Payer: Self-pay | Admitting: *Deleted

## 2015-02-22 MED ORDER — FUROSEMIDE 40 MG PO TABS: 40.0000 mg | ORAL_TABLET | Freq: Every day | ORAL | Status: DC

## 2015-04-08 ENCOUNTER — Encounter: Payer: Self-pay | Admitting: Internal Medicine

## 2015-04-08 DIAGNOSIS — E785 Hyperlipidemia, unspecified: Secondary | ICD-10-CM | POA: Diagnosis not present

## 2015-04-08 DIAGNOSIS — I2510 Atherosclerotic heart disease of native coronary artery without angina pectoris: Secondary | ICD-10-CM | POA: Diagnosis not present

## 2015-04-08 DIAGNOSIS — I10 Essential (primary) hypertension: Secondary | ICD-10-CM | POA: Diagnosis not present

## 2015-04-08 DIAGNOSIS — E1165 Type 2 diabetes mellitus with hyperglycemia: Secondary | ICD-10-CM | POA: Diagnosis not present

## 2015-04-08 DIAGNOSIS — F329 Major depressive disorder, single episode, unspecified: Secondary | ICD-10-CM | POA: Diagnosis not present

## 2015-04-08 DIAGNOSIS — Z6827 Body mass index (BMI) 27.0-27.9, adult: Secondary | ICD-10-CM | POA: Diagnosis not present

## 2015-04-16 ENCOUNTER — Encounter: Payer: Self-pay | Admitting: *Deleted

## 2015-05-03 ENCOUNTER — Encounter: Payer: Self-pay | Admitting: *Deleted

## 2015-06-17 ENCOUNTER — Encounter: Payer: Commercial Managed Care - HMO | Admitting: Internal Medicine

## 2015-07-10 ENCOUNTER — Encounter: Payer: Self-pay | Admitting: Internal Medicine

## 2015-07-10 ENCOUNTER — Ambulatory Visit (INDEPENDENT_AMBULATORY_CARE_PROVIDER_SITE_OTHER): Payer: Commercial Managed Care - HMO | Admitting: Internal Medicine

## 2015-07-10 VITALS — BP 120/72 | HR 61 | Ht 68.0 in | Wt 182.6 lb

## 2015-07-10 DIAGNOSIS — Z23 Encounter for immunization: Secondary | ICD-10-CM

## 2015-07-10 DIAGNOSIS — I255 Ischemic cardiomyopathy: Secondary | ICD-10-CM | POA: Diagnosis not present

## 2015-07-10 DIAGNOSIS — I5022 Chronic systolic (congestive) heart failure: Secondary | ICD-10-CM | POA: Diagnosis not present

## 2015-07-10 DIAGNOSIS — I10 Essential (primary) hypertension: Secondary | ICD-10-CM | POA: Diagnosis not present

## 2015-07-10 NOTE — Progress Notes (Signed)
Cardiology/EP Office Note   Date:  07/10/2015   ID:  ALFARD COCHRANE, DOB 12-28-1955, MRN [id number]  PCP:  Tanna Furry, PA-C  Cardiologist:  Dr. Marca Ancona   Electrophysiologist:  Dr. Hillis Range    History of Present Illness: Zachary Berry is a 59 y.o. male with a hx of CAD s/p ant MI in 7/10 tx with DES to LAD, ischemic cardiomyopathy, systolic CHF, s/p AICD, diabetes, HL, depression.  He is doing well presently.  Denies SOB (above baseline), CP, dizziness, presyncope or syncope.  Recent Labs: No results found for requested labs within last 365 days.  Wt Readings from Last 3 Encounters:  07/10/15 182 lb 9.6 oz (82.827 kg)  07/30/14 182 lb (82.555 kg)  05/26/14 190 lb 0.6 oz (86.2 kg)      Past Medical History  Diagnosis Date  . CAD (coronary artery disease)     a. s/p Ant STEMI >> LHC (04/03/2009): EF 30%, proximal LAD occluded >>> PCI: Xience DES to prox LAD.;  b.  LHC (9/15): Proximal LAD stent patent  . Diabetes mellitus   . Learning disability   . Hyperlipidemia   . Poor vision   . Ischemic cardiomyopathy     a.  Echo (12/2012): EF 30-35%, anteroseptal hypokinesis, mild LAE;   b.  Cardiac MRI (06/2010): Full thickness scar of the dist ant wall, septum, apex with 2/3 thickness scar of mid and basal ant wall, EF 34%.  . Depression   . Chronic systolic heart failure (HCC)   . HTN (hypertension)   . S/P implantation of automatic cardioverter/defibrillator (AICD)     Current Outpatient Prescriptions  Medication Sig Dispense Refill  . aspirin EC 81 MG tablet Take 81 mg by mouth daily.    . carvedilol (COREG) 25 MG tablet Take 1 tablet (25 mg total) by mouth 2 (two) times daily. 60 tablet 6  . clopidogrel (PLAVIX) 75 MG tablet Take 1 tablet (75 mg total) by mouth daily. 90 tablet 1  . enalapril (VASOTEC) 20 MG tablet Take 1 tablet (20 mg total) by mouth 2 (two) times daily. 60 tablet 0  . fenofibrate 160 MG tablet Take 160 mg by mouth daily.    .  furosemide (LASIX) 40 MG tablet Take 1 tablet (40 mg total) by mouth daily. 30 tablet 0  . glipiZIDE (GLUCOTROL) 10 MG tablet Take 10 mg by mouth 2 (two) times daily before a meal.      . insulin glargine (LANTUS) 100 UNIT/ML injection Inject 45 Units into the skin at bedtime as needed (for blood sugar).     . metFORMIN (GLUCOPHAGE) 1000 MG tablet Take 1 tablet by mouth 2 (two) times daily.    . nitroGLYCERIN (NITROSTAT) 0.4 MG SL tablet Place 0.4 mg under the tongue every 5 (five) minutes as needed for chest pain.     . NON FORMULARY 1 each by Other route See admin instructions. Use CPAP machine nightly.    . rosuvastatin (CRESTOR) 40 MG tablet Take 1 tablet (40 mg total) by mouth daily. 30 tablet 0  . sertraline (ZOLOFT) 100 MG tablet Take 100 mg by mouth 2 (two) times daily.     Marland Kitchen spironolactone (ALDACTONE) 25 MG tablet Take 1 tablet (25 mg total) by mouth daily. 30 tablet 6   No current facility-administered medications for this visit.    Allergies:   Review of patient's allergies indicates no known allergies.   Social History:  The patient  reports that he  has never smoked. He has never used smokeless tobacco. He reports that he does not drink alcohol or use illicit drugs.   Family History:  The patient's family history includes Diabetes in his brother, father, mother, and sister; Heart attack (age of onset: 88) in his brother; Heart disease in his mother; Heart failure in his mother; Hypertension in his father; Stomach cancer in an other family member; Stroke in his mother; Sudden death in his brother.   ROS:  Please see the history of present illness.      All other systems reviewed and negative.   PHYSICAL EXAM: VS:  BP 120/72 mmHg  Pulse 61  Ht 5\' 8"  (1.727 m)  Wt 182 lb 9.6 oz (82.827 kg)  BMI 27.77 kg/m2 Well nourished, well developed, in no acute distress HEENT: normal Neck: no JVD Vascular:  No bruits bilaterally; DP/PT 2+ bilaterally  Cardiac:  normal S1, S2; RRR; no  murmur Lungs:  clear to auscultation bilaterally, no wheezing, rhonchi or rales Abd: soft, nontender, no hepatomegaly Ext: no edema Skin: warm and dry Neuro:  CNs 2-12 intact, no focal abnormalities noted    ASSESSMENT AND PLAN:  1. Chronic systolic heart failure:  Volume stable.  He is NYHA 2-2b.  Continue current Rx.  Will enroll in ICM device clinic  Needs to follow-up more closely with general cardiology.  He has not seen Dr Shirlee Latch in several years.  May benefit from seeing one of our general cardiologists to re-establish. 2. ISCHEMIC CARDIOMYOPATHY:  Continue current dose of beta blocker, ACEI, Spironolactone.  3. Atherosclerosis of native coronary artery of native heart without angina pectoris:  He has chronic chest pain for years.  There has been no change.  Recent cath is reviewed. HYPERTENSION:  Stable 4. No change required today HYPERLIPIDEMIA-MIXED:  Stable 5. No change required today 6. Obesity:  He has lost 23 lbs since last seen by dieting.  I have congratulated him on this.   Continue carelink monitoring Return to see me in the device clinic in 1 year to see EP NP  Hillis Range, MD 07/10/2015 9:53 AM

## 2015-07-10 NOTE — Patient Instructions (Signed)
Medication Instructions:  Your physician recommends that you continue on your current medications as directed. Please refer to the Current Medication list given to you today.   Labwork: None ordered   Testing/Procedures: None ordered   Follow-Up: Your physician wants you to follow-up in: 12 months with Gypsy Balsam, NP You will receive a reminder letter in the mail two months in advance. If you don't receive a letter, please call our office to schedule the follow-up appointment.  Remote monitoring is used to monitor your ICD from home. This monitoring reduces the number of office visits required to check your device to one time per year. It allows [place] to keep an eye on the functioning of your device to ensure it is working properly. You are scheduled for a device check from home on 10/09/15. You may send your transmission at any time that day. If you have a wireless device, the transmission will be sent automatically. After your physician reviews your transmission, you will receive a postcard with your next transmission date.     ICM--Laurie Short, RN will call you with time  You have been referred to Dr Elease Hashimoto for general cardiology MD    Any Other Special Instructions Will Be Listed Below (If Applicable).     If you need a refill on your cardiac medications before your next appointment, please call your pharmacy.

## 2015-07-11 LAB — CUP PACEART INCLINIC DEVICE CHECK
Brady Statistic AP VS Percent: 28.11 %
Brady Statistic AS VP Percent: 0.02 %
Brady Statistic RA Percent Paced: 28.13 %
Date Time Interrogation Session: 20161102125058
HighPow Impedance: 380 Ohm
HighPow Impedance: 45 Ohm
HighPow Impedance: 53 Ohm
Implantable Lead Location: 753859
Implantable Lead Location: 753860
Implantable Lead Model: 5076
Implantable Lead Model: 6947
Lead Channel Impedance Value: 513 Ohm
Lead Channel Pacing Threshold Amplitude: 0.625 V
Lead Channel Pacing Threshold Pulse Width: 0.4 ms
Lead Channel Sensing Intrinsic Amplitude: 3 mV
Lead Channel Sensing Intrinsic Amplitude: 3.125 mV
Lead Channel Setting Pacing Amplitude: 2.5 V
Lead Channel Setting Pacing Pulse Width: 0.4 ms
MDC IDC LEAD IMPLANT DT: 20111111
MDC IDC LEAD IMPLANT DT: 20111111
MDC IDC MSMT BATTERY VOLTAGE: 3.02 V
MDC IDC MSMT LEADCHNL RA PACING THRESHOLD AMPLITUDE: 0.75 V
MDC IDC MSMT LEADCHNL RA PACING THRESHOLD PULSEWIDTH: 0.4 ms
MDC IDC MSMT LEADCHNL RV IMPEDANCE VALUE: 456 Ohm
MDC IDC MSMT LEADCHNL RV SENSING INTR AMPL: 13.625 mV
MDC IDC MSMT LEADCHNL RV SENSING INTR AMPL: 16.75 mV
MDC IDC SET LEADCHNL RA PACING AMPLITUDE: 2 V
MDC IDC SET LEADCHNL RV SENSING SENSITIVITY: 0.3 mV
MDC IDC STAT BRADY AP VP PERCENT: 0.02 %
MDC IDC STAT BRADY AS VS PERCENT: 71.85 %
MDC IDC STAT BRADY RV PERCENT PACED: 0.04 %

## 2015-07-16 NOTE — Progress Notes (Signed)
Patient referred by Dr Johney Frame at office visit on 07/10/2015.  ICM introduction given in the office and explained program.  He agreed to monthly ICM follow up.  1st home transmission date scheduled for 08/14/2015 and letter sent to patient.

## 2015-08-14 ENCOUNTER — Ambulatory Visit (INDEPENDENT_AMBULATORY_CARE_PROVIDER_SITE_OTHER): Payer: Commercial Managed Care - HMO

## 2015-08-14 ENCOUNTER — Other Ambulatory Visit: Payer: Self-pay | Admitting: Cardiology

## 2015-08-14 DIAGNOSIS — Z95810 Presence of automatic (implantable) cardiac defibrillator: Secondary | ICD-10-CM

## 2015-08-14 DIAGNOSIS — I5022 Chronic systolic (congestive) heart failure: Secondary | ICD-10-CM

## 2015-08-14 NOTE — Progress Notes (Signed)
EPIC Encounter for ICM Monitoring  Patient Name: Zachary Berry is a 59 y.o. male Date: 08/14/2015 Primary Care Physican: Tanna Furry, PA-C Primary Cardiologist: Shirlee Latch Electrophysiologist: Allred Dry Weight: 177.4 lbs       In the past month, have you:  1. Gained more than 2 pounds in a day or more than 5 pounds in a week? no  2. Had changes in your medications (with verification of current medications)? no  3. Had more shortness of breath than is usual for you? no  4. Limited your activity because of shortness of breath? no  5. Not been able to sleep because of shortness of breath? no  6. Had increased swelling in your feet or ankles? no  7. Had symptoms of dehydration (dizziness, dry mouth, increased thirst, decreased urine output) no  8. Had changes in sodium restriction? no  9. Been compliant with medication? Yes   ICM trend:  08/14/2015   Follow-up plan: ICM clinic phone appointment on 09/16/2015.  1st ICM encounter.  Optivol thoracic impedance below baseline 07/24/2015 to 08/06/2015 suggesting fluid retention.  Patient denied any HF symptoms.  Education given on HF symptoms to report, limit sodium intake to 2000 mg daily and fluid restriction 64 oz.  Patient reported his dry weight normally ranges 177 to 182.  Explained how to monitor weight for fluid gain.  He reported he is doing well at this time.  No changes today.    Copy of note sent to patient's primary care physician, primary cardiologist, and device following physician.  Karie Soda, RN, CCM 08/14/2015 2:58 PM

## 2015-08-14 NOTE — Telephone Encounter (Signed)
Rx(s) sent to pharmacy electronically.  

## 2015-09-16 ENCOUNTER — Ambulatory Visit (INDEPENDENT_AMBULATORY_CARE_PROVIDER_SITE_OTHER): Payer: Commercial Managed Care - HMO

## 2015-09-16 DIAGNOSIS — Z95810 Presence of automatic (implantable) cardiac defibrillator: Secondary | ICD-10-CM | POA: Diagnosis not present

## 2015-09-16 DIAGNOSIS — I5022 Chronic systolic (congestive) heart failure: Secondary | ICD-10-CM

## 2015-09-18 NOTE — Progress Notes (Signed)
EPIC Encounter for ICM Monitoring  Patient Name: Zachary Berry is a 60 y.o. male Date: 09/18/2015 Primary Care Physican: Tanna Furry, PA-C Primary Cardiologist: Shirlee Latch Electrophysiologist: Allred  Dry Weight: 176 lb       In the past month, have you:  1. Gained more than 2 pounds in a day or more than 5 pounds in a week? no  2. Had changes in your medications (with verification of current medications)? no  3. Had more shortness of breath than is usual for you? no  4. Limited your activity because of shortness of breath? no  5. Not been able to sleep because of shortness of breath? no  6. Had increased swelling in your feet or ankles? no  7. Had symptoms of dehydration (dizziness, dry mouth, increased thirst, decreased urine output) no  8. Had changes in sodium restriction? no  9. Been compliant with medication? Yes   ICM trend: 3 month view 09/16/2015  ICM trend: 1 year view 09/16/2015   Follow-up plan: ICM clinic phone appointment on 10/22/2015.  OPTIVOL: Thoracic impedance slightly below baseline 09/11/2015 to 1/9/02017.   He denied any HF symptoms and stated he did eat some foods that were higher in sodium during the holidays.  He stated he resumed low salt diet.  He stated he has been doing well.   No changes today.    Copy of note sent to patient's primary care physician, primary cardiologist, and device following physician.  Karie Soda, RN, CCM 09/18/2015 4:13 PM

## 2015-09-23 DIAGNOSIS — I2510 Atherosclerotic heart disease of native coronary artery without angina pectoris: Secondary | ICD-10-CM | POA: Diagnosis not present

## 2015-09-23 DIAGNOSIS — I10 Essential (primary) hypertension: Secondary | ICD-10-CM | POA: Diagnosis not present

## 2015-09-23 DIAGNOSIS — E1165 Type 2 diabetes mellitus with hyperglycemia: Secondary | ICD-10-CM | POA: Diagnosis not present

## 2015-09-23 DIAGNOSIS — E785 Hyperlipidemia, unspecified: Secondary | ICD-10-CM | POA: Diagnosis not present

## 2015-09-23 DIAGNOSIS — F329 Major depressive disorder, single episode, unspecified: Secondary | ICD-10-CM | POA: Diagnosis not present

## 2015-09-23 DIAGNOSIS — Z6826 Body mass index (BMI) 26.0-26.9, adult: Secondary | ICD-10-CM | POA: Diagnosis not present

## 2015-10-23 ENCOUNTER — Ambulatory Visit (INDEPENDENT_AMBULATORY_CARE_PROVIDER_SITE_OTHER): Payer: Commercial Managed Care - HMO

## 2015-10-23 DIAGNOSIS — I5022 Chronic systolic (congestive) heart failure: Secondary | ICD-10-CM

## 2015-10-23 DIAGNOSIS — Z95810 Presence of automatic (implantable) cardiac defibrillator: Secondary | ICD-10-CM

## 2015-10-23 NOTE — Progress Notes (Signed)
EPIC Encounter for ICM Monitoring  Patient Name: Zachary Berry is a 60 y.o. male Date: 10/23/2015 Primary Care Physican: Tanna Furry, PA-C Primary Cardiologist: Shirlee Latch Electrophysiologist: Allred Dry Weight: 175 lb       In the past month, have you:  1. Gained more than 2 pounds in a day or more than 5 pounds in a week? no  2. Had changes in your medications (with verification of current medications)? no  3. Had more shortness of breath than is usual for you? no  4. Limited your activity because of shortness of breath? no  5. Not been able to sleep because of shortness of breath? no  6. Had increased swelling in your feet or ankles? no  7. Had symptoms of dehydration (dizziness, dry mouth, increased thirst, decreased urine output) no  8. Had changes in sodium restriction? no  9. Been compliant with medication? Yes   ICM trend: 3 month view    ICM trend: 1 year view   Follow-up plan: ICM clinic phone appointment on 11/27/2015.  Optivol thoracic impedance below reference line brom 10/01/2015 to 10/13/2015 suggesting fluid retention.  He denied any symptoms during that time.  He stated he is feeling good at this time.  Encouraged to call for any fluid symptoms.  No changes today.   Copy of note sent to patient's primary care physician, primary cardiologist, and device following physician.  Karie Soda, RN, CCM 10/23/2015 12:37 PM

## 2015-11-27 ENCOUNTER — Telehealth: Payer: Self-pay

## 2015-11-27 ENCOUNTER — Ambulatory Visit (INDEPENDENT_AMBULATORY_CARE_PROVIDER_SITE_OTHER): Payer: Commercial Managed Care - HMO | Admitting: *Deleted

## 2015-11-27 DIAGNOSIS — Z95810 Presence of automatic (implantable) cardiac defibrillator: Secondary | ICD-10-CM | POA: Diagnosis not present

## 2015-11-27 DIAGNOSIS — I5022 Chronic systolic (congestive) heart failure: Secondary | ICD-10-CM

## 2015-11-27 DIAGNOSIS — I255 Ischemic cardiomyopathy: Secondary | ICD-10-CM | POA: Diagnosis not present

## 2015-11-27 NOTE — Progress Notes (Signed)
EPIC Encounter for ICM Monitoring  Patient Name: Zachary Berry is a 60 y.o. male Date: 11/27/2015 Primary Care Physican: Daphene Jaeger, PA-C Primary Cardiologist: Aundra Dubin Electrophysiologist: Allred Dry Weight: 177 lbs   In the past month, have you:  1. Gained more than 2 pounds in a day or more than 5 pounds in a week? no  2. Had changes in your medications (with verification of current medications)? no  3. Had more shortness of breath than is usual for you? no  4. Limited your activity because of shortness of breath? no  5. Not been able to sleep because of shortness of breath? no  6. Had increased swelling in your feet or ankles? no  7. Had symptoms of dehydration (dizziness, dry mouth, increased thirst, decreased urine output) no  8. Had changes in sodium restriction? no  9. Been compliant with medication? Yes   ICM trend: 3 month view for 11/27/2015   ICM trend: 1 year view for 11/27/2015   Follow-up plan: ICM clinic phone appointment on 12/10/2015.  Thoracic impedance below reference line starting 11/19/2015 suggesting fluid accumulation.  Patient denied any fluid symptoms.   Patient stated he has been eating out more in the last week.  Education given to limit sodium intake to < 2000 mg and fluid intake to 64 oz daily.   Advised would call Zachary Hout's, NP office to get any recent lab results before making recommendations.    Received copy of faxed lab results from NP Zachary Berry office.   04/08/2015 Creatinine 0.76, BUN 8, Potassium 4.8, eGFR 100  Call to patient and advised to increase Furosemide 40 mg to bid x 3 days and then returned to prescribed dosage of Furosemide 40 mg daily.   Advised to call if he has any fluid symptoms or if he has any weakness, numbness/tingling, or muscle cramps related to taking extra Furosemide.  He verbalized understanding.   Repeat transmission 12/10/2015   Copy of note sent to patient's primary care physician, primary  cardiologist, and device following physician.  Zachary Billings, RN, CCM 11/27/2015 11:47 AM

## 2015-11-27 NOTE — Progress Notes (Signed)
Remote ICD transmission.   

## 2015-11-27 NOTE — Telephone Encounter (Signed)
Call to [place], NP, office and requested to fax patient's latest lab results and she stated she would do so.

## 2015-12-10 ENCOUNTER — Ambulatory Visit (INDEPENDENT_AMBULATORY_CARE_PROVIDER_SITE_OTHER): Payer: Commercial Managed Care - HMO

## 2015-12-10 DIAGNOSIS — Z95810 Presence of automatic (implantable) cardiac defibrillator: Secondary | ICD-10-CM

## 2015-12-10 DIAGNOSIS — I5022 Chronic systolic (congestive) heart failure: Secondary | ICD-10-CM

## 2015-12-10 NOTE — Progress Notes (Signed)
EPIC Encounter for ICM Monitoring  Patient Name: Zachary Berry is a 60 y.o. male Date: 12/10/2015 Primary Care Physican: Tanna Furry, PA-C Primary Cardiologist: Shirlee Latch Electrophysiologist: Allred Dry Weight: 176 lb    In the past month, have you:  1. Gained more than 2 pounds in a day or more than 5 pounds in a week? no  2. Had changes in your medications (with verification of current medications)? no  3. Had more shortness of breath than is usual for you? no  4. Limited your activity because of shortness of breath? no  5. Not been able to sleep because of shortness of breath? no  6. Had increased swelling in your feet or ankles? no  7. Had symptoms of dehydration (dizziness, dry mouth, increased thirst, decreased urine output) no  8. Had changes in sodium restriction? no  9. Been compliant with medication? Yes   ICM trend: 3 month view for 12/10/2015   ICM trend: 1 year view for 12/10/2015   Follow-up plan: ICM clinic phone appointment on 01/10/2016.  Since last ICM remote transmission on 11/27/2015, thoracic impedance trending along reference line which correlates with additional Furosemide.  Patient denied fluid symptoms. Education given to limit sodium intake to < 2000 mg and fluid intake to 64 oz daily.  Encouraged to call for any fluid symptoms.  No changes today.    Copy of note sent to patient's primary care physician, primary cardiologist, and device following physician.  Karie Soda, RN, CCM 12/10/2015 1:37 PM

## 2015-12-26 ENCOUNTER — Other Ambulatory Visit: Payer: Self-pay | Admitting: Cardiology

## 2015-12-26 LAB — CUP PACEART REMOTE DEVICE CHECK
Battery Voltage: 2.99 V
Brady Statistic AP VS Percent: 28.86 %
Brady Statistic AS VS Percent: 71.09 %
HIGH POWER IMPEDANCE MEASURED VALUE: 47 Ohm
HighPow Impedance: 380 Ohm
HighPow Impedance: 55 Ohm
Implantable Lead Implant Date: 20111111
Implantable Lead Implant Date: 20111111
Implantable Lead Model: 6947
Lead Channel Impedance Value: 513 Ohm
Lead Channel Pacing Threshold Amplitude: 0.75 V
Lead Channel Pacing Threshold Pulse Width: 0.4 ms
Lead Channel Pacing Threshold Pulse Width: 0.4 ms
Lead Channel Sensing Intrinsic Amplitude: 14.125 mV
Lead Channel Sensing Intrinsic Amplitude: 3.125 mV
Lead Channel Setting Pacing Amplitude: 2 V
Lead Channel Setting Pacing Amplitude: 2.5 V
Lead Channel Setting Pacing Pulse Width: 0.4 ms
Lead Channel Setting Sensing Sensitivity: 0.3 mV
MDC IDC LEAD LOCATION: 753859
MDC IDC LEAD LOCATION: 753860
MDC IDC MSMT LEADCHNL RA SENSING INTR AMPL: 3.125 mV
MDC IDC MSMT LEADCHNL RV IMPEDANCE VALUE: 494 Ohm
MDC IDC MSMT LEADCHNL RV PACING THRESHOLD AMPLITUDE: 0.625 V
MDC IDC MSMT LEADCHNL RV SENSING INTR AMPL: 14.125 mV
MDC IDC SESS DTM: 20170322073626
MDC IDC STAT BRADY AP VP PERCENT: 0.02 %
MDC IDC STAT BRADY AS VP PERCENT: 0.02 %
MDC IDC STAT BRADY RA PERCENT PACED: 28.89 %
MDC IDC STAT BRADY RV PERCENT PACED: 0.04 %

## 2015-12-27 ENCOUNTER — Encounter: Payer: Self-pay | Admitting: Cardiology

## 2016-01-10 ENCOUNTER — Telehealth: Payer: Self-pay | Admitting: Cardiology

## 2016-01-10 NOTE — Telephone Encounter (Signed)
Spoke with pt and reminded pt of remote transmission that is due today. Pt verbalized understanding.   

## 2016-01-13 NOTE — Progress Notes (Signed)
No remote ICM transmission received for 01/10/2016.  Next transmission scheduled for 01/22/2016.

## 2016-01-22 ENCOUNTER — Ambulatory Visit (INDEPENDENT_AMBULATORY_CARE_PROVIDER_SITE_OTHER): Payer: Commercial Managed Care - HMO

## 2016-01-22 DIAGNOSIS — I5022 Chronic systolic (congestive) heart failure: Secondary | ICD-10-CM | POA: Diagnosis not present

## 2016-01-22 DIAGNOSIS — Z95810 Presence of automatic (implantable) cardiac defibrillator: Secondary | ICD-10-CM

## 2016-01-24 NOTE — Progress Notes (Signed)
EPIC Encounter for ICM Monitoring  Patient Name: Zachary Berry is a 60 y.o. male Date: 01/24/2016 Primary Care Physican: Tanna Furry, PA-C Primary Cardiologist: Shirlee Latch Electrophysiologist: Allred Dry Weight: 175 lbs      In the past month, have you:  1. Gained more than 2 pounds in a day or more than 5 pounds in a week? no  2. Had changes in your medications (with verification of current medications)? no  3. Had more shortness of breath than is usual for you? no  4. Limited your activity because of shortness of breath? no  5. Not been able to sleep because of shortness of breath? no  6. Had increased swelling in your feet, ankles, legs or stomach area? no  7. Had symptoms of dehydration (dizziness, dry mouth, increased thirst, decreased urine output) no  8. Had changes in sodium restriction? no  9. Been compliant with medication? Yes  ICM trend: 3 month view for 01/22/2016   ICM trend: 1 year view for 01/22/2016   Follow-up plan: ICM clinic phone appointment 02/26/2016.     FLUID LEVELS:  Optivol thoracic impedance decreased 12/28/2015 to 01/03/2016, 01/11/2016 to 01/19/2016 suggesting fluid accumulation and he is unsure what would cause the accumulation.    SYMPTOMS:    None.  Denied any symptoms such as weight gain of 3 pounds overnight or 5 pounds within a week, SOB and/or lower extremity swelling.   Encouraged to call for any fluid symptoms.   EDUCATION:  He stated he is not eating out at restaurants as much now and encouraged him to limit sodium intake to < 2000 mg and fluid intake to 64 oz daily.     RECOMMENDATIONS: No changes today.    Patient reported he has not seen general cardiologist, Dr Shirlee Latch since 2015.  Dr Johney Frame recommended he establish with general cardiologist per his office note on 07/2015.  Current appointment scheduled with Dr Elease Hashimoto in 2018 instead of 2017.  Advised would have scheduler call him to make an appointment this year.    Karie Soda, RN, CCM 01/24/2016 12:26 PM

## 2016-02-26 ENCOUNTER — Ambulatory Visit (INDEPENDENT_AMBULATORY_CARE_PROVIDER_SITE_OTHER): Payer: Commercial Managed Care - HMO | Admitting: *Deleted

## 2016-02-26 DIAGNOSIS — I5022 Chronic systolic (congestive) heart failure: Secondary | ICD-10-CM | POA: Diagnosis not present

## 2016-02-26 DIAGNOSIS — I255 Ischemic cardiomyopathy: Secondary | ICD-10-CM | POA: Diagnosis not present

## 2016-02-26 DIAGNOSIS — Z95810 Presence of automatic (implantable) cardiac defibrillator: Secondary | ICD-10-CM

## 2016-02-26 NOTE — Progress Notes (Signed)
Remote ICD transmission.   

## 2016-02-27 NOTE — Progress Notes (Signed)
EPIC Encounter for ICM Monitoring  Patient Name: Zachary Berry is a 60 y.o. male Date: 02/27/2016 Primary Care Physican: Tanna Furry, PA-C Primary Cardiologist: Shirlee Latch Electrophysiologist: Allred Dry Weight:        In the past month, have you:  1. Gained more than 2 pounds in a day or more than 5 pounds in a week? N/A  2. Had changes in your medications (with verification of current medications)? N/A  3. Had more shortness of breath than is usual for you? N/A  4. Limited your activity because of shortness of breath? N/A  5. Not been able to sleep because of shortness of breath? N/A  6. Had increased swelling in your feet, ankles, legs or stomach area? N/A  7. Had symptoms of dehydration (dizziness, dry mouth, increased thirst, decreased urine output) N/A  8. Had changes in sodium restriction? N/A  9. Been compliant with medication? N/A  ICM trend: 3 month view for 02/26/2016   ICM trend: 1 year view for 02/26/2016   Follow-up plan: ICM clinic phone appointment 04/01/2016.  Attempted call to patient and unable to reach.  Transmission reviewed.  FLUID LEVELS: Optivol thoracic impedance decreased 01/25/2016 to 02/14/2016 (with a few days at baseline) and 02/20/2016 to 6/17/2017suggesting fluid accumulation.      Karie Soda, RN, CCM 02/27/2016 10:07 AM

## 2016-02-28 ENCOUNTER — Encounter: Payer: Self-pay | Admitting: Cardiology

## 2016-02-29 LAB — CUP PACEART REMOTE DEVICE CHECK
Brady Statistic AP VP Percent: 0.02 %
Brady Statistic AP VS Percent: 24.32 %
Brady Statistic RV Percent Paced: 0.04 %
Date Time Interrogation Session: 20170621062709
HIGH POWER IMPEDANCE MEASURED VALUE: 399 Ohm
HIGH POWER IMPEDANCE MEASURED VALUE: 51 Ohm
HIGH POWER IMPEDANCE MEASURED VALUE: 60 Ohm
Implantable Lead Implant Date: 20111111
Implantable Lead Model: 5076
Implantable Lead Model: 6947
Lead Channel Pacing Threshold Amplitude: 0.5 V
Lead Channel Pacing Threshold Pulse Width: 0.4 ms
Lead Channel Pacing Threshold Pulse Width: 0.4 ms
Lead Channel Sensing Intrinsic Amplitude: 15 mV
Lead Channel Sensing Intrinsic Amplitude: 15 mV
Lead Channel Setting Pacing Amplitude: 2 V
Lead Channel Setting Pacing Amplitude: 2.5 V
Lead Channel Setting Pacing Pulse Width: 0.4 ms
Lead Channel Setting Sensing Sensitivity: 0.3 mV
MDC IDC LEAD IMPLANT DT: 20111111
MDC IDC LEAD LOCATION: 753859
MDC IDC LEAD LOCATION: 753860
MDC IDC MSMT BATTERY VOLTAGE: 2.96 V
MDC IDC MSMT LEADCHNL RA IMPEDANCE VALUE: 513 Ohm
MDC IDC MSMT LEADCHNL RA PACING THRESHOLD AMPLITUDE: 0.75 V
MDC IDC MSMT LEADCHNL RA SENSING INTR AMPL: 3.25 mV
MDC IDC MSMT LEADCHNL RA SENSING INTR AMPL: 3.25 mV
MDC IDC MSMT LEADCHNL RV IMPEDANCE VALUE: 494 Ohm
MDC IDC STAT BRADY AS VP PERCENT: 0.02 %
MDC IDC STAT BRADY AS VS PERCENT: 75.64 %
MDC IDC STAT BRADY RA PERCENT PACED: 24.33 %

## 2016-04-01 ENCOUNTER — Ambulatory Visit (INDEPENDENT_AMBULATORY_CARE_PROVIDER_SITE_OTHER): Payer: Commercial Managed Care - HMO

## 2016-04-01 DIAGNOSIS — I5022 Chronic systolic (congestive) heart failure: Secondary | ICD-10-CM

## 2016-04-01 DIAGNOSIS — Z95810 Presence of automatic (implantable) cardiac defibrillator: Secondary | ICD-10-CM | POA: Diagnosis not present

## 2016-04-02 ENCOUNTER — Telehealth: Payer: Self-pay

## 2016-04-02 NOTE — Telephone Encounter (Signed)
Remote ICM transmission received.  Attempted patient call and left message for return call.   

## 2016-04-02 NOTE — Progress Notes (Signed)
EPIC Encounter for ICM Monitoring  Patient Name: Zachary Berry is a 60 y.o. male Date: 04/02/2016 Primary Care Physican: Tanna Furry, PA-C Primary Cardiologist: Shirlee Latch Electrophysiologist: Allred Dry Weight: unknown       Attempted patient call and unable to reach.  Transmission reviewed.   Thoracic impedance decreased suggesting fluid accumulation with a few days at baseline in the last month. Impedance at baseline 03/31/2016.   ICM trend: 04/01/2016    Follow-up plan: ICM clinic phone appointment on 05/04/2016.  Copy of ICM check sent to device physician.   Karie Soda, RN 04/02/2016 12:49 PM

## 2016-04-14 NOTE — Progress Notes (Signed)
Patient returned call and denied any fluid symptoms.  He reported sometimes he drinks a lot of ice tea which could cause some fluid retention.  Advised to limit fluid intake to 64 oz = 2 liters a day and salt intake to 2000 mg daily.  Advised to call for any fluid symptoms.  No changes today.  Next ICM remote transmission 05/04/2016.

## 2016-05-04 ENCOUNTER — Ambulatory Visit (INDEPENDENT_AMBULATORY_CARE_PROVIDER_SITE_OTHER): Payer: Commercial Managed Care - HMO

## 2016-05-04 DIAGNOSIS — I5022 Chronic systolic (congestive) heart failure: Secondary | ICD-10-CM

## 2016-05-04 DIAGNOSIS — Z95810 Presence of automatic (implantable) cardiac defibrillator: Secondary | ICD-10-CM

## 2016-05-04 NOTE — Progress Notes (Signed)
EPIC Encounter for ICM Monitoring  Patient Name: Zachary Berry is a 60 y.o. male Date: 05/04/2016 Primary Care Physican: Tanna Furry, PA-C Primary Cardiologist: Shirlee Latch Electrophysiologist: Allred Dry Weight:  unknown      Heart Failure questions reviewed, pt symptomatic with slight swelling in fingers.  Patient reported he retains fluid because he drinks > 2 liters a day and does this regularly.  Explained excessive fluid intake causes fluid accumulation.   Thoracic impedance abnormal suggesting fluid accumulation 04/28/2016.  Recommendations: Advised to limit fluid intake to <2 liters a day.  Advised to increase Furosemide 40 mg to bid x 3 days and return to prescribed dosage of 40 mg daily after 3rd day.  Advised if the finger swelling resolves before 3rd day then he can return to prescribed dosage.    Follow-up plan: ICM clinic phone appointment on 06/04/2016.  Copy of ICM check sent to primary cardiologist and device physician.   ICM trend: 05/04/2016       Karie Soda, RN 05/04/2016 2:34 PM

## 2016-06-04 ENCOUNTER — Ambulatory Visit (INDEPENDENT_AMBULATORY_CARE_PROVIDER_SITE_OTHER): Payer: Commercial Managed Care - HMO

## 2016-06-04 ENCOUNTER — Ambulatory Visit (INDEPENDENT_AMBULATORY_CARE_PROVIDER_SITE_OTHER): Payer: Commercial Managed Care - HMO | Admitting: *Deleted

## 2016-06-04 DIAGNOSIS — I5022 Chronic systolic (congestive) heart failure: Secondary | ICD-10-CM

## 2016-06-04 DIAGNOSIS — Z95810 Presence of automatic (implantable) cardiac defibrillator: Secondary | ICD-10-CM | POA: Diagnosis not present

## 2016-06-04 DIAGNOSIS — I255 Ischemic cardiomyopathy: Secondary | ICD-10-CM

## 2016-06-04 NOTE — Progress Notes (Signed)
Remote ICD transmission.   

## 2016-06-04 NOTE — Progress Notes (Signed)
EPIC Encounter for ICM Monitoring  Patient Name: Zachary Berry is a 60 y.o. male Date: 06/04/2016 Primary Care Physican: Tanna Furry, PA-C Primary Cardiologist:[place] Electrophysiologist: Allred Dry Weight: 172 lb        Heart Failure questions reviewed, pt asymptomatic   Thoracic impedance normal.  Recommendations: No changes.  Low sodium diet education provided.    Follow-up plan: ICM clinic phone appointment on 08/13/2016.  Copy of ICM check sent to device physician.   ICM trend: 06/04/2016       Karie Soda, RN 06/04/2016 9:48 AM

## 2016-06-10 ENCOUNTER — Encounter: Payer: Self-pay | Admitting: Cardiology

## 2016-07-02 LAB — CUP PACEART REMOTE DEVICE CHECK
Battery Voltage: 2.95 V
Brady Statistic AS VP Percent: 0.02 %
Brady Statistic AS VS Percent: 78.46 %
HIGH POWER IMPEDANCE MEASURED VALUE: 60 Ohm
HighPow Impedance: 437 Ohm
HighPow Impedance: 53 Ohm
Implantable Lead Implant Date: 20111111
Implantable Lead Location: 753859
Implantable Lead Model: 6947
Lead Channel Impedance Value: 532 Ohm
Lead Channel Pacing Threshold Amplitude: 0.5 V
Lead Channel Pacing Threshold Pulse Width: 0.4 ms
Lead Channel Sensing Intrinsic Amplitude: 3.125 mV
Lead Channel Setting Pacing Pulse Width: 0.4 ms
MDC IDC LEAD IMPLANT DT: 20111111
MDC IDC LEAD LOCATION: 753860
MDC IDC MSMT LEADCHNL RA PACING THRESHOLD AMPLITUDE: 0.625 V
MDC IDC MSMT LEADCHNL RA SENSING INTR AMPL: 3.125 mV
MDC IDC MSMT LEADCHNL RV IMPEDANCE VALUE: 494 Ohm
MDC IDC MSMT LEADCHNL RV PACING THRESHOLD PULSEWIDTH: 0.4 ms
MDC IDC MSMT LEADCHNL RV SENSING INTR AMPL: 15.375 mV
MDC IDC MSMT LEADCHNL RV SENSING INTR AMPL: 15.375 mV
MDC IDC SESS DTM: 20170928083827
MDC IDC SET LEADCHNL RA PACING AMPLITUDE: 2 V
MDC IDC SET LEADCHNL RV PACING AMPLITUDE: 2.5 V
MDC IDC SET LEADCHNL RV SENSING SENSITIVITY: 0.3 mV
MDC IDC STAT BRADY AP VP PERCENT: 0.02 %
MDC IDC STAT BRADY AP VS PERCENT: 21.5 %
MDC IDC STAT BRADY RA PERCENT PACED: 21.52 %
MDC IDC STAT BRADY RV PERCENT PACED: 0.04 %

## 2016-07-06 DIAGNOSIS — E1165 Type 2 diabetes mellitus with hyperglycemia: Secondary | ICD-10-CM | POA: Diagnosis not present

## 2016-07-06 DIAGNOSIS — E663 Overweight: Secondary | ICD-10-CM | POA: Diagnosis not present

## 2016-07-06 DIAGNOSIS — I10 Essential (primary) hypertension: Secondary | ICD-10-CM | POA: Diagnosis not present

## 2016-07-06 DIAGNOSIS — Z1389 Encounter for screening for other disorder: Secondary | ICD-10-CM | POA: Diagnosis not present

## 2016-07-06 DIAGNOSIS — E785 Hyperlipidemia, unspecified: Secondary | ICD-10-CM | POA: Diagnosis not present

## 2016-07-06 DIAGNOSIS — Z6826 Body mass index (BMI) 26.0-26.9, adult: Secondary | ICD-10-CM | POA: Diagnosis not present

## 2016-07-13 ENCOUNTER — Encounter: Payer: Commercial Managed Care - HMO | Admitting: Nurse Practitioner

## 2016-08-11 NOTE — Progress Notes (Signed)
Electrophysiology Office Note Date: 08/13/2016  ID:  Zachary Berry, DOB 1956/05/07, MRN [id number]  PCP: Tanna Furry, PA-C Primary Cardiologist: Shirlee Latch Electrophysiologist: Allred  CC: Routine ICD follow-up  Zachary Berry is a 60 y.o. male seen today for Dr Johney Frame.  He presents today for routine electrophysiology followup.  Since last being seen in our clinic, the patient reports doing very well. He denies chest pain, palpitations, dyspnea, PND, orthopnea, nausea, vomiting, dizziness, syncope, edema, weight gain, or early satiety.  He has not had ICD shocks.   Device History: MDT dual chamber ICD implanted 2011 for ICM History of appropriate therapy: No History of AAD therapy: No   Past Medical History:  Diagnosis Date  . CAD (coronary artery disease)    a. s/p Ant STEMI >> LHC (04/03/2009): EF 30%, proximal LAD occluded >>> PCI: Xience DES to prox LAD.;  b.  LHC (9/15): Proximal LAD stent patent  . Chronic systolic heart failure (HCC)   . Depression   . Diabetes mellitus   . HTN (hypertension)   . Hyperlipidemia   . Ischemic cardiomyopathy    a.  Echo (12/2012): EF 30-35%, anteroseptal hypokinesis, mild LAE;   b.  Cardiac MRI (06/2010): Full thickness scar of the dist ant wall, septum, apex with 2/3 thickness scar of mid and basal ant wall, EF 34%.  . Learning disability   . Poor vision   . S/P implantation of automatic cardioverter/defibrillator (AICD)    Past Surgical History:  Procedure Laterality Date  . HAND SURGERY    . ICD Implant  07/18/10   MDT ICD implanted by Dr Johney Frame  . LEFT HEART CATHETERIZATION WITH CORONARY ANGIOGRAM N/A 05/25/2014   Procedure: LEFT HEART CATHETERIZATION WITH CORONARY ANGIOGRAM;  Surgeon: Thurmon Fair, MD;  Location: [facility] CATH [facility];  Service: Cardiovascular;  Laterality: N/A;  . PCI LAD  7/10    Current Outpatient Prescriptions  Medication Sig Dispense Refill  . aspirin EC 81 MG tablet Take 81 mg by mouth daily.    .  clopidogrel (PLAVIX) 75 MG tablet Take 1 tablet (75 mg total) by mouth daily. 90 tablet 1  . enalapril (VASOTEC) 20 MG tablet Take 1 tablet (20 mg total) by mouth 2 (two) times daily. 60 tablet 0  . fenofibrate 160 MG tablet Take 160 mg by mouth daily.    . furosemide (LASIX) 40 MG tablet TAKE 1 TABLET BY MOUTH ONCE DAILY. 90 tablet 2  . glipiZIDE (GLUCOTROL) 10 MG tablet Take 10 mg by mouth 2 (two) times daily before a meal.      . insulin glargine (LANTUS) 100 UNIT/ML injection Inject 45 Units into the skin at bedtime as needed (for blood sugar).     . metFORMIN (GLUCOPHAGE) 1000 MG tablet Take 1 tablet by mouth 2 (two) times daily.    . nitroGLYCERIN (NITROSTAT) 0.4 MG SL tablet Place 1 tablet (0.4 mg total) under the tongue every 5 (five) minutes as needed for chest pain. 25 tablet 3  . NON FORMULARY 1 each by Other route See admin instructions. Use CPAP machine nightly.    . rosuvastatin (CRESTOR) 40 MG tablet Take 1 tablet (40 mg total) by mouth daily. 30 tablet 6  . sertraline (ZOLOFT) 100 MG tablet Take 100 mg by mouth 2 (two) times daily.     Marland Kitchen spironolactone (ALDACTONE) 25 MG tablet Take 1 tablet (25 mg total) by mouth daily. 30 tablet 6  . carvedilol (COREG) 25 MG tablet Take 1 tablet (25  mg total) by mouth 2 (two) times daily. 60 tablet 6   No current facility-administered medications for this visit.     Allergies:   Patient has no known allergies.   Social History: Social History   Social History  . Marital status: Married    Spouse name: N/A  . Number of children: N/A  . Years of education: N/A   Occupational History  . Disabled     due to CAD   Social History Main Topics  . Smoking status: Never Smoker  . Smokeless tobacco: Never Used     Comment: former passive smoker  . Alcohol use No  . Drug use: No  . Sexual activity: Not on file   Other Topics Concern  . Not on file   Social History Narrative  . No narrative on file    Family History: Family History   Problem Relation Age of Onset  . Diabetes Father   . Heart disease Mother   . Heart attack Brother 45  . Stomach cancer      uncle  . Heart failure Mother   . Diabetes Mother   . Diabetes Sister   . Diabetes Brother   . Hypertension Father   . Sudden death Brother   . Stroke Mother     Review of Systems: All other systems reviewed and are otherwise negative except as noted above.   Physical Exam: VS:  BP 118/78   Pulse 62   Ht 5\' 8"  (1.727 m)   Wt 176 lb (79.8 kg)   BMI 26.76 kg/m  , BMI Body mass index is 26.76 kg/m.  GEN- The patient is well appearing, alert and oriented x 3 today.   HEENT: normocephalic, atraumatic; sclera clear, conjunctiva pink; hearing intact; oropharynx clear; neck supple  Lungs- Clear to ausculation bilaterally, normal work of breathing.  No wheezes, rales, rhonchi Heart- Regular rate and rhythm, no murmurs, rubs or gallops  GI- soft, non-tender, non-distended, bowel sounds present  Extremities- no clubbing, cyanosis, or edema  MS- no significant deformity or atrophy Skin- warm and dry, no rash or lesion; ICD pocket well healed Psych- euthymic mood, full affect Neuro- strength and sensation are intact  ICD interrogation- reviewed in detail today,  See PACEART report  EKG:  EKG is ordered today. The ekg ordered today shows sinus rhythm, rate 62  Recent Labs: No results found for requested labs within last 8760 hours.   Wt Readings from Last 3 Encounters:  08/13/16 176 lb (79.8 kg)  07/10/15 182 lb 9.6 oz (82.8 kg)  07/30/14 182 lb (82.6 kg)     Other studies Reviewed: Additional studies/ records that were reviewed today include: Dr Jenel LucksAllred's office notes  Assessment and Plan:  1.  Chronic systolic dysfunction euvolemic today Stable on an appropriate medical regimen Normal ICD function See Pace Art report No changes today  2.  CAD/ICM No recent ischemic symptoms Continue medical therapy Needs to re-establish with general  cardiology (previously Dr Shirlee LatchMcLean) BMET, CBC, lipids, LFT's today   3.  HTN Stable No change required today   Current medicines are reviewed at length with the patient today.   The patient does not have concerns regarding his medicines.  The following changes were made today:  none  Labs/ tests ordered today include:  Orders Placed This Encounter  Procedures  . CBC  . Brain natriuretic peptide  . Lipid panel  . Hepatic function panel  . EKG 12-Lead     Disposition:  Follow up with ICM clinic, [facility], Dr End for general cardiology (previously Dr Shirlee Latch) in 6 months, Dr Johney Frame 1 year      Signed, Gypsy Balsam, NP 08/13/2016 10:16 AM  [facility] [address] [place] [address] [phone] (office) [phone] (fax

## 2016-08-13 ENCOUNTER — Encounter (INDEPENDENT_AMBULATORY_CARE_PROVIDER_SITE_OTHER): Payer: Self-pay

## 2016-08-13 ENCOUNTER — Ambulatory Visit (INDEPENDENT_AMBULATORY_CARE_PROVIDER_SITE_OTHER): Payer: Commercial Managed Care - HMO | Admitting: Nurse Practitioner

## 2016-08-13 VITALS — BP 118/78 | HR 62 | Ht 68.0 in | Wt 176.0 lb

## 2016-08-13 DIAGNOSIS — I5022 Chronic systolic (congestive) heart failure: Secondary | ICD-10-CM | POA: Diagnosis not present

## 2016-08-13 DIAGNOSIS — I255 Ischemic cardiomyopathy: Secondary | ICD-10-CM

## 2016-08-13 DIAGNOSIS — I10 Essential (primary) hypertension: Secondary | ICD-10-CM | POA: Diagnosis not present

## 2016-08-13 LAB — CBC
HCT: 43.5 % (ref 38.5–50.0)
Hemoglobin: 14.4 g/dL (ref 13.2–17.1)
MCH: 30.6 pg (ref 27.0–33.0)
MCHC: 33.1 g/dL (ref 32.0–36.0)
MCV: 92.4 fL (ref 80.0–100.0)
MPV: 9.5 fL (ref 7.5–12.5)
PLATELETS: 272 10*3/uL (ref 140–400)
RBC: 4.71 MIL/uL (ref 4.20–5.80)
RDW: 13.9 % (ref 11.0–15.0)
WBC: 5.2 10*3/uL (ref 3.8–10.8)

## 2016-08-13 LAB — HEPATIC FUNCTION PANEL
ALK PHOS: 44 U/L (ref 40–115)
ALT: 14 U/L (ref 9–46)
AST: 13 U/L (ref 10–35)
Albumin: 4.4 g/dL (ref 3.6–5.1)
BILIRUBIN INDIRECT: 0.5 mg/dL (ref 0.2–1.2)
Bilirubin, Direct: 0.1 mg/dL (ref <=0.2)
TOTAL PROTEIN: 6.6 g/dL (ref 6.1–8.1)
Total Bilirubin: 0.6 mg/dL (ref 0.2–1.2)

## 2016-08-13 LAB — LIPID PANEL
CHOLESTEROL: 105 mg/dL (ref <200)
HDL: 39 mg/dL — ABNORMAL LOW (ref >40)
LDL Cholesterol: 40 mg/dL (ref <100)
TRIGLYCERIDES: 132 mg/dL (ref <150)
Total CHOL/HDL Ratio: 2.7 Ratio (ref <5.0)
VLDL: 26 mg/dL (ref <30)

## 2016-08-13 LAB — BRAIN NATRIURETIC PEPTIDE: Brain Natriuretic Peptide: 12.2 pg/mL (ref <100)

## 2016-08-13 MED ORDER — ROSUVASTATIN CALCIUM 40 MG PO TABS: 40.0000 mg | ORAL_TABLET | Freq: Every day | ORAL | 6 refills | Status: DC

## 2016-08-13 MED ORDER — NITROGLYCERIN 0.4 MG SL SUBL: 0.4000 mg | SUBLINGUAL_TABLET | SUBLINGUAL | 3 refills | Status: DC | PRN

## 2016-08-13 NOTE — Patient Instructions (Addendum)
Medication Instructions:    Your physician recommends that you continue on your current medications as directed. Please refer to the Current Medication list given to you today.    If you need a refill on your cardiac medications before your next appointment, please call your pharmacy.  Labwork: BMET CBC LIPID AND LIVER    Testing/Procedures: NONE ORDERED  TODAY    Follow-Up: Your physician wants you to follow-up in:  IN  6  MONTHS WITH DR END NEW PT   You will receive a reminder letter in the mail two months in advance. If you don't receive a letter, please call our office to schedule the follow-up appointment.   Your physician wants you to follow-up in: ONE YEAR WITH ALLRED   You will receive a reminder letter in the mail two months in advance. If you don't receive a letter, please call our office to schedule the follow-up appointment.  Remote monitoring is used to monitor your Pacemaker of ICD from home. This monitoring reduces the number of office visits required to check your device to one time per year. It allows [place] to keep an eye on the functioning of your device to ensure it is working properly. You are scheduled for a device check from home on ..11/12/16..You may send your transmission at any time that day. If you have a wireless device, the transmission will be sent automatically. After your physician reviews your transmission, you will receive a postcard with your next transmission date.     Any Other Special Instructions Will Be Listed Below (If Applicable).

## 2016-08-14 LAB — CUP PACEART INCLINIC DEVICE CHECK
Date Time Interrogation Session: 20171208095507
Implantable Lead Implant Date: 20111111
Implantable Lead Location: 753860
MDC IDC LEAD IMPLANT DT: 20111111
MDC IDC LEAD LOCATION: 753859
MDC IDC PG IMPLANT DT: 20111111

## 2016-08-14 NOTE — Progress Notes (Signed)
ICM remote transmission rescheduled for 09/14/2016 since patient had in office defib check on 08/13/2016 with Gypsy Balsam, NP.

## 2016-08-24 ENCOUNTER — Encounter: Payer: Self-pay | Admitting: Internal Medicine

## 2016-08-24 ENCOUNTER — Telehealth: Payer: Self-pay | Admitting: *Deleted

## 2016-08-24 NOTE — Telephone Encounter (Signed)
LMOVM TO CALL BACK FOR RESULTS 

## 2016-08-24 NOTE — Telephone Encounter (Signed)
This encounter was created in error - please disregard.

## 2016-08-24 NOTE — Telephone Encounter (Signed)
SPOKE TO PT ABOUT RESULTS AND VERBALIZED UNDRSTANDING 

## 2016-08-24 NOTE — Telephone Encounter (Signed)
°  Follow Up   Pt is calling following up on lab work results. Please call.

## 2016-08-24 NOTE — Telephone Encounter (Signed)
Zachary Berry      08/24/16 2:01 PM  Note     Follow Up   Pt is calling following up on lab work results. Please call.

## 2016-08-24 NOTE — Telephone Encounter (Signed)
-----   Message from Amber K Seiler, NP sent at 08/14/2016  3:31 PM EST ----- Please notify patient of stable labs. Thanks! 

## 2016-08-24 NOTE — Telephone Encounter (Signed)
-----   Message from Marily Lente, NP sent at 08/14/2016  3:31 PM EST ----- Please notify patient of stable labs. Thanks!

## 2016-09-14 ENCOUNTER — Ambulatory Visit (INDEPENDENT_AMBULATORY_CARE_PROVIDER_SITE_OTHER): Payer: Medicare HMO

## 2016-09-14 ENCOUNTER — Other Ambulatory Visit: Payer: Self-pay | Admitting: Cardiology

## 2016-09-14 DIAGNOSIS — Z95810 Presence of automatic (implantable) cardiac defibrillator: Secondary | ICD-10-CM

## 2016-09-14 DIAGNOSIS — I5022 Chronic systolic (congestive) heart failure: Secondary | ICD-10-CM

## 2016-09-14 NOTE — Progress Notes (Addendum)
EPIC Encounter for ICM Monitoring  Patient Name: Zachary Berry is a 61 y.o. male Date: 09/14/2016 Primary Care Physican: Tanna Furry, PA-C Primary Cardiologist:[place] Electrophysiologist: Allred Dry Weight:   170 lb        Heart Failure questions reviewed, pt asymptomatic   Thoracic impedance abnormal suggesting fluid accumulation.  He thinks he may be drinking too much fluid and advised to limit to 64 oz daily.  Recommendations:  Advised to increase Furosemide 40 mg 1 tablet bid x 3 days and then returned to prescribed dosage of 1 tablet daily.    Patient requested to change primary pharmacy to [facility] in [place].    Follow-up plan: ICM clinic phone appointment on 09/24/2016.  Copy of ICM check sent to primary cardiologist and device physician.   3 month ICM trend : 09/14/2016   1 Year ICM trend:      Karie Soda, RN 09/14/2016 1:35 PM

## 2016-09-25 NOTE — Progress Notes (Signed)
  No ICM remote transmission received on 09/24/2016.   Next ICM transmission scheduled for 10/15/2016.

## 2016-09-28 DIAGNOSIS — E119 Type 2 diabetes mellitus without complications: Secondary | ICD-10-CM | POA: Diagnosis not present

## 2016-09-28 DIAGNOSIS — E663 Overweight: Secondary | ICD-10-CM | POA: Diagnosis not present

## 2016-09-28 DIAGNOSIS — E785 Hyperlipidemia, unspecified: Secondary | ICD-10-CM | POA: Diagnosis not present

## 2016-09-28 DIAGNOSIS — Z6826 Body mass index (BMI) 26.0-26.9, adult: Secondary | ICD-10-CM | POA: Diagnosis not present

## 2016-10-15 ENCOUNTER — Telehealth: Payer: Self-pay | Admitting: Cardiology

## 2016-10-15 NOTE — Telephone Encounter (Signed)
Spoke with pt and reminded pt of remote transmission that is due today. Pt verbalized understanding.   

## 2016-10-22 NOTE — Progress Notes (Signed)
No ICM remote transmission received for 10/15/2016 and next ICM transmission scheduled for 11/12/2016.

## 2016-11-12 ENCOUNTER — Telehealth: Payer: Self-pay | Admitting: Cardiology

## 2016-11-12 NOTE — Telephone Encounter (Signed)
Confirmed remote transmission w/ pt.   

## 2016-11-19 NOTE — Progress Notes (Signed)
No ICM remote transmission received for 11/12/2016 and next ICM transmission scheduled for 12/10/2016.

## 2016-12-10 ENCOUNTER — Telehealth: Payer: Self-pay | Admitting: Cardiology

## 2016-12-10 ENCOUNTER — Ambulatory Visit (INDEPENDENT_AMBULATORY_CARE_PROVIDER_SITE_OTHER): Payer: Medicare HMO

## 2016-12-10 DIAGNOSIS — I5022 Chronic systolic (congestive) heart failure: Secondary | ICD-10-CM | POA: Diagnosis not present

## 2016-12-10 DIAGNOSIS — Z95810 Presence of automatic (implantable) cardiac defibrillator: Secondary | ICD-10-CM

## 2016-12-10 NOTE — Progress Notes (Signed)
EPIC Encounter for ICM Monitoring  Patient Name: Zachary Berry is a 61 y.o. male Date: 12/10/2016 Primary Care Physican: Tanna Furry, PA-C Primary Cardiologist:[place] Electrophysiologist: Allred Dry Weight:170lb       Heart Failure questions reviewed, pt asymptomatic   Thoracic impedance normal but was abnormal suggesting fluid accumulation from 11/26/2016 to 12/06/2016.  He reported increasing fluid intake during decreased impedance because he was sick.  Prescribed and confirmed dosage: Furosemide 40 mg 1 tablet daily  Recommendations: No changes. Discussed to limit salt intake to 2000 mg/day and fluid intake to < 2 liters/day.  Encouraged to call for fluid symptoms.  Follow-up plan: ICM clinic phone appointment on 01/12/2017.    Copy of ICM check sent to device physician.   3 month ICM trend: 12/10/2016   1 Year ICM trend:      Karie Soda, RN 12/10/2016 5:05 PM

## 2016-12-10 NOTE — Telephone Encounter (Signed)
Spoke with pt and reminded pt of remote transmission that is due today. Pt verbalized understanding.   

## 2017-01-12 ENCOUNTER — Ambulatory Visit (INDEPENDENT_AMBULATORY_CARE_PROVIDER_SITE_OTHER): Payer: Medicare HMO

## 2017-01-12 DIAGNOSIS — Z95810 Presence of automatic (implantable) cardiac defibrillator: Secondary | ICD-10-CM

## 2017-01-12 DIAGNOSIS — I5022 Chronic systolic (congestive) heart failure: Secondary | ICD-10-CM | POA: Diagnosis not present

## 2017-01-12 NOTE — Progress Notes (Signed)
EPIC Encounter for ICM Monitoring  Patient Name: Zachary Berry is a 61 y.o. male Date: 01/12/2017 Primary Care Physican: Tanna Furry, PA-C Primary Cardiologist:Allred Electrophysiologist: Allred Dry Weight:170lb          Heart Failure questions reviewed, pt asymptomatic.   Thoracic impedance is normal today but was abnormal suggesting fluid accumulation 01/05/2017 to today.  Prescribed and confirmed dosage: Furosemide 40 mg 1 tablet daily  Recommendations: No changes.    Follow-up plan: ICM clinic phone appointment on 02/12/2017.  Patient reported he no longer see's Dr Shirlee Latch.  Advised he should be receiving a letter to make an appointment with Dr End  Copy of ICM check sent to device physician.   3 month ICM trend: 01/12/2017   1 Year ICM trend:      Karie Soda, RN 01/12/2017 11:43 AM

## 2017-01-27 DIAGNOSIS — E785 Hyperlipidemia, unspecified: Secondary | ICD-10-CM | POA: Diagnosis not present

## 2017-01-27 DIAGNOSIS — Z6825 Body mass index (BMI) 25.0-25.9, adult: Secondary | ICD-10-CM | POA: Diagnosis not present

## 2017-01-27 DIAGNOSIS — F329 Major depressive disorder, single episode, unspecified: Secondary | ICD-10-CM | POA: Diagnosis not present

## 2017-01-27 DIAGNOSIS — E119 Type 2 diabetes mellitus without complications: Secondary | ICD-10-CM | POA: Diagnosis not present

## 2017-01-27 DIAGNOSIS — I10 Essential (primary) hypertension: Secondary | ICD-10-CM | POA: Diagnosis not present

## 2017-01-27 DIAGNOSIS — E663 Overweight: Secondary | ICD-10-CM | POA: Diagnosis not present

## 2017-02-03 DIAGNOSIS — E119 Type 2 diabetes mellitus without complications: Secondary | ICD-10-CM | POA: Diagnosis not present

## 2017-02-12 ENCOUNTER — Telehealth: Payer: Self-pay

## 2017-02-12 ENCOUNTER — Ambulatory Visit (INDEPENDENT_AMBULATORY_CARE_PROVIDER_SITE_OTHER): Payer: Medicare HMO

## 2017-02-12 DIAGNOSIS — I5022 Chronic systolic (congestive) heart failure: Secondary | ICD-10-CM | POA: Diagnosis not present

## 2017-02-12 DIAGNOSIS — Z95810 Presence of automatic (implantable) cardiac defibrillator: Secondary | ICD-10-CM

## 2017-02-12 NOTE — Telephone Encounter (Signed)
Remote ICM transmission received.  Attempted patient call to both listed numbers and both are not in service at this time.

## 2017-02-12 NOTE — Progress Notes (Signed)
EPIC Encounter for ICM Monitoring  Patient Name: Zachary Berry is a 61 y.o. male Date: 02/12/2017 Primary Care Physican: Tanna Furry, PA-C Primary Cardiologist:Allred Electrophysiologist: Allred Dry Weight: Last known ICM weight 170lb       Attempted call to patient and unable to reach due to both listed numbers not in service at this time.  Transmission reviewed.    Thoracic impedance normal.  Prescribed dosage: Furosemide 40 mg 1 tablet daily  Recommendations: NONE - Unable to reach patient   Follow-up plan: ICM clinic phone appointment on 03/15/2017.    Copy of ICM check sent to device physician.   3 month ICM trend: 02/12/2017   1 Year ICM trend:      Karie Soda, RN 02/12/2017 8:27 AM

## 2017-03-15 ENCOUNTER — Telehealth: Payer: Self-pay

## 2017-03-15 ENCOUNTER — Ambulatory Visit (INDEPENDENT_AMBULATORY_CARE_PROVIDER_SITE_OTHER): Payer: Medicare HMO | Admitting: *Deleted

## 2017-03-15 DIAGNOSIS — I255 Ischemic cardiomyopathy: Secondary | ICD-10-CM

## 2017-03-15 DIAGNOSIS — I5022 Chronic systolic (congestive) heart failure: Secondary | ICD-10-CM | POA: Diagnosis not present

## 2017-03-15 DIAGNOSIS — Z95810 Presence of automatic (implantable) cardiac defibrillator: Secondary | ICD-10-CM

## 2017-03-15 NOTE — Progress Notes (Signed)
EPIC Encounter for ICM Monitoring  Patient Name: Zachary Berry is a 61 y.o. male Date: 03/15/2017 Primary Care Physican: Tanna Furry, PA-C Primary Cardiologist:Allred Electrophysiologist: Allred Dry Weight: Last known ICM weight 170lb       Attempted call to patient and unable to reach.  Transmission reviewed.    Thoracic impedance normal.  Prescribed dosage: Furosemide 40 mg 1 tablet daily  Recommendations: NONE - Unable to reach patient   Follow-up plan: ICM clinic phone appointment on 04/15/2017.   Copy of ICM check sent to device physician.   3 month ICM trend: 03/15/2017   1 Year ICM trend:      Karie Soda, RN 03/15/2017 11:47 AM

## 2017-03-15 NOTE — Telephone Encounter (Signed)
Remote ICM transmission received.  Attempted patient call and all numbers listed have been disconnected.

## 2017-03-16 LAB — CUP PACEART REMOTE DEVICE CHECK
Battery Voltage: 2.76 V
Brady Statistic AP VS Percent: 16.95 %
Brady Statistic AS VS Percent: 83.01 %
Brady Statistic RV Percent Paced: 0.04 %
HIGH POWER IMPEDANCE MEASURED VALUE: 49 Ohm
HighPow Impedance: 399 Ohm
HighPow Impedance: 57 Ohm
Implantable Lead Implant Date: 20111111
Implantable Lead Implant Date: 20111111
Implantable Lead Model: 6947
Lead Channel Pacing Threshold Amplitude: 0.75 V
Lead Channel Pacing Threshold Pulse Width: 0.4 ms
Lead Channel Pacing Threshold Pulse Width: 0.4 ms
Lead Channel Sensing Intrinsic Amplitude: 14.625 mV
Lead Channel Sensing Intrinsic Amplitude: 3.375 mV
Lead Channel Setting Pacing Amplitude: 2.5 V
Lead Channel Setting Sensing Sensitivity: 0.3 mV
MDC IDC LEAD LOCATION: 753859
MDC IDC LEAD LOCATION: 753860
MDC IDC MSMT LEADCHNL RA IMPEDANCE VALUE: 513 Ohm
MDC IDC MSMT LEADCHNL RA SENSING INTR AMPL: 3.375 mV
MDC IDC MSMT LEADCHNL RV IMPEDANCE VALUE: 456 Ohm
MDC IDC MSMT LEADCHNL RV PACING THRESHOLD AMPLITUDE: 0.625 V
MDC IDC MSMT LEADCHNL RV SENSING INTR AMPL: 14.625 mV
MDC IDC PG IMPLANT DT: 20111111
MDC IDC SESS DTM: 20180709073625
MDC IDC SET LEADCHNL RA PACING AMPLITUDE: 2 V
MDC IDC SET LEADCHNL RV PACING PULSEWIDTH: 0.4 ms
MDC IDC STAT BRADY AP VP PERCENT: 0.02 %
MDC IDC STAT BRADY AS VP PERCENT: 0.02 %
MDC IDC STAT BRADY RA PERCENT PACED: 16.97 %

## 2017-03-22 ENCOUNTER — Encounter: Payer: Self-pay | Admitting: Cardiology

## 2017-04-15 ENCOUNTER — Ambulatory Visit (INDEPENDENT_AMBULATORY_CARE_PROVIDER_SITE_OTHER): Payer: Medicare HMO

## 2017-04-15 ENCOUNTER — Telehealth: Payer: Self-pay

## 2017-04-15 DIAGNOSIS — Z95810 Presence of automatic (implantable) cardiac defibrillator: Secondary | ICD-10-CM

## 2017-04-15 DIAGNOSIS — I5022 Chronic systolic (congestive) heart failure: Secondary | ICD-10-CM

## 2017-04-15 NOTE — Telephone Encounter (Signed)
Remote ICM transmission received.  Attempted patient call and recording stated not in service

## 2017-04-15 NOTE — Progress Notes (Signed)
EPIC Encounter for ICM Monitoring  Patient Name: Zachary Berry is a 61 y.o. male Date: 04/15/2017 Primary Care Physican: Tanna Furry, PA-C Primary Cardiologist:Allred Electrophysiologist: Allred Dry Weight: Last known ICM weight 170lb        Attempted call to patient and unable to reach.  Transmission reviewed.   Thoracic impedance normal but was abnormal suggesting fluid accumulation from 7/18 to 8/5 with except of 2 days at baseline  Prescribed dosage: Furosemide 40 mg 1 tablet daily  Recommendations: NONE - Unable to reach patient   Follow-up plan: ICM clinic phone appointment on 05/17/2017.    Copy of ICM check sent to device physician.   3 month ICM trend: 04/15/2017   1 Year ICM trend:      Karie Soda, RN 04/15/2017 10:54 AM

## 2017-05-17 ENCOUNTER — Telehealth: Payer: Self-pay

## 2017-05-17 ENCOUNTER — Ambulatory Visit (INDEPENDENT_AMBULATORY_CARE_PROVIDER_SITE_OTHER): Payer: Medicare HMO

## 2017-05-17 DIAGNOSIS — I5022 Chronic systolic (congestive) heart failure: Secondary | ICD-10-CM | POA: Diagnosis not present

## 2017-05-17 DIAGNOSIS — Z95810 Presence of automatic (implantable) cardiac defibrillator: Secondary | ICD-10-CM

## 2017-05-17 NOTE — Telephone Encounter (Signed)
Remote ICM transmission received.  Attempted call to patient and no working numbers.

## 2017-05-17 NOTE — Progress Notes (Signed)
EPIC Encounter for ICM Monitoring  Patient Name: Zachary Berry is a 61 y.o. male Date: 05/17/2017 Primary Care Physican: Tanna Furry, PA-C Primary Cardiologist:Allred Electrophysiologist: Allred Dry Weight: Last known ICM weight 170lb       Attempted call to patient and unable to reach.  Transmission reviewed.    Thoracic impedance normal.  Prescribed dosage: Furosemide 40 mg 1 tablet daily  Recommendations: NONE - Unable to reach patient   Follow-up plan: ICM clinic phone appointment on 06/17/2017.    Copy of ICM check sent to Dr. Johney Frame.   3 month ICM trend: 05/17/2017   1 Year ICM trend:      Karie Soda, RN 05/17/2017 12:47 PM

## 2017-06-17 ENCOUNTER — Ambulatory Visit (INDEPENDENT_AMBULATORY_CARE_PROVIDER_SITE_OTHER): Payer: Medicare HMO | Admitting: *Deleted

## 2017-06-17 DIAGNOSIS — I5022 Chronic systolic (congestive) heart failure: Secondary | ICD-10-CM | POA: Diagnosis not present

## 2017-06-17 DIAGNOSIS — I255 Ischemic cardiomyopathy: Secondary | ICD-10-CM

## 2017-06-17 DIAGNOSIS — Z23 Encounter for immunization: Secondary | ICD-10-CM | POA: Diagnosis not present

## 2017-06-17 DIAGNOSIS — Z95810 Presence of automatic (implantable) cardiac defibrillator: Secondary | ICD-10-CM | POA: Diagnosis not present

## 2017-06-17 DIAGNOSIS — E119 Type 2 diabetes mellitus without complications: Secondary | ICD-10-CM | POA: Diagnosis not present

## 2017-06-17 DIAGNOSIS — F329 Major depressive disorder, single episode, unspecified: Secondary | ICD-10-CM | POA: Diagnosis not present

## 2017-06-17 DIAGNOSIS — E785 Hyperlipidemia, unspecified: Secondary | ICD-10-CM | POA: Diagnosis not present

## 2017-06-17 DIAGNOSIS — I10 Essential (primary) hypertension: Secondary | ICD-10-CM | POA: Diagnosis not present

## 2017-06-17 NOTE — Progress Notes (Signed)
Remote ICD transmission.   

## 2017-06-18 ENCOUNTER — Telehealth: Payer: Self-pay

## 2017-06-18 ENCOUNTER — Encounter: Payer: Self-pay | Admitting: Cardiology

## 2017-06-18 LAB — CUP PACEART REMOTE DEVICE CHECK
Brady Statistic AP VP Percent: 0.02 %
Brady Statistic AP VS Percent: 27.53 %
Brady Statistic AS VP Percent: 0.02 %
Brady Statistic RV Percent Paced: 0.04 %
HIGH POWER IMPEDANCE MEASURED VALUE: 437 Ohm
HIGH POWER IMPEDANCE MEASURED VALUE: 50 Ohm
HIGH POWER IMPEDANCE MEASURED VALUE: 61 Ohm
Implantable Lead Implant Date: 20111111
Implantable Lead Model: 5076
Implantable Lead Model: 6947
Lead Channel Pacing Threshold Amplitude: 0.625 V
Lead Channel Pacing Threshold Pulse Width: 0.4 ms
Lead Channel Sensing Intrinsic Amplitude: 17.25 mV
Lead Channel Sensing Intrinsic Amplitude: 17.25 mV
Lead Channel Sensing Intrinsic Amplitude: 4.125 mV
Lead Channel Setting Pacing Amplitude: 2 V
Lead Channel Setting Pacing Amplitude: 2.5 V
Lead Channel Setting Pacing Pulse Width: 0.4 ms
Lead Channel Setting Sensing Sensitivity: 0.3 mV
MDC IDC LEAD IMPLANT DT: 20111111
MDC IDC LEAD LOCATION: 753859
MDC IDC LEAD LOCATION: 753860
MDC IDC MSMT BATTERY VOLTAGE: 2.71 V
MDC IDC MSMT LEADCHNL RA IMPEDANCE VALUE: 494 Ohm
MDC IDC MSMT LEADCHNL RA PACING THRESHOLD AMPLITUDE: 0.625 V
MDC IDC MSMT LEADCHNL RA PACING THRESHOLD PULSEWIDTH: 0.4 ms
MDC IDC MSMT LEADCHNL RA SENSING INTR AMPL: 4.125 mV
MDC IDC MSMT LEADCHNL RV IMPEDANCE VALUE: 494 Ohm
MDC IDC PG IMPLANT DT: 20111111
MDC IDC SESS DTM: 20181011052306
MDC IDC STAT BRADY AS VS PERCENT: 72.43 %
MDC IDC STAT BRADY RA PERCENT PACED: 27.55 %

## 2017-06-18 NOTE — Progress Notes (Signed)
EPIC Encounter for ICM Monitoring  Patient Name: Zachary Berry is a 60 y.o. male Date: 06/18/2017 Primary Care Physican: Tanna Furry, PA-C Primary Cardiologist:Allred Electrophysiologist: Allred Dry Weight: Last known ICM weight 170lb        Attempted call to patient and unable to reach.   Transmission reviewed.    Thoracic impedance normal today but was abnormal suggesting fluid accumulation for majority of days since 05/16/2017.  Prescribed dosage:  Furosemide 40 mg 1 tablet daily   Recommendations: NONE - Unable to reach patient   Follow-up plan: ICM clinic phone appointment on 07/19/2017.  Due for yearly EP appointment in December.  Copy of ICM check sent to Dr. Johney Frame.   3 month ICM trend: 06/17/2017   1 Year ICM trend:      Karie Soda, RN 06/18/2017 2:08 PM

## 2017-06-18 NOTE — Telephone Encounter (Signed)
Remote ICM transmission received.  Attempted call to patient and recording stated prescriber is not in service.

## 2017-07-19 ENCOUNTER — Ambulatory Visit (INDEPENDENT_AMBULATORY_CARE_PROVIDER_SITE_OTHER): Payer: Medicare HMO

## 2017-07-19 DIAGNOSIS — E119 Type 2 diabetes mellitus without complications: Secondary | ICD-10-CM | POA: Diagnosis not present

## 2017-07-19 DIAGNOSIS — Z95810 Presence of automatic (implantable) cardiac defibrillator: Secondary | ICD-10-CM | POA: Diagnosis not present

## 2017-07-19 DIAGNOSIS — M25519 Pain in unspecified shoulder: Secondary | ICD-10-CM | POA: Diagnosis not present

## 2017-07-19 DIAGNOSIS — F329 Major depressive disorder, single episode, unspecified: Secondary | ICD-10-CM | POA: Diagnosis not present

## 2017-07-19 DIAGNOSIS — I5022 Chronic systolic (congestive) heart failure: Secondary | ICD-10-CM

## 2017-07-19 DIAGNOSIS — I10 Essential (primary) hypertension: Secondary | ICD-10-CM | POA: Diagnosis not present

## 2017-07-20 ENCOUNTER — Telehealth: Payer: Self-pay

## 2017-07-20 NOTE — Progress Notes (Signed)
EPIC Encounter for ICM Monitoring  Patient Name: Zachary Berry is a 61 y.o. male Date: 07/20/2017 Primary Care Physican: Tanna Furry, PA-C Primary Cardiologist:Allred Electrophysiologist: Allred Dry Weight: Last known ICM weight 170lb           Attempted call to patient and unable to reach. Transmission reviewed.    Thoracic impedance normal but was abnormal suggesting fluid accumulation days within last month.  Prescribed dosage: Furosemide 40 mg 1 tablet daily  Recommendations: NONE - Unable to reach.  Follow-up plan: ICM clinic phone appointment on 08/19/2017.  Due to schedule EP appointment for December.   Copy of ICM check sent to Dr. Johney Frame.   3 month ICM trend: 07/19/2017    1 Year ICM trend:       Karie Soda, RN 07/20/2017 2:15 PM

## 2017-07-20 NOTE — Telephone Encounter (Signed)
Remote ICM transmission received.  Attempted call to patient and recording stated phone number is not working.

## 2017-08-19 ENCOUNTER — Ambulatory Visit (INDEPENDENT_AMBULATORY_CARE_PROVIDER_SITE_OTHER): Payer: Medicare HMO

## 2017-08-19 DIAGNOSIS — Z95810 Presence of automatic (implantable) cardiac defibrillator: Secondary | ICD-10-CM | POA: Diagnosis not present

## 2017-08-19 DIAGNOSIS — I5022 Chronic systolic (congestive) heart failure: Secondary | ICD-10-CM

## 2017-08-24 NOTE — Progress Notes (Signed)
EPIC Encounter for ICM Monitoring  Patient Name: Zachary Berry is a 61 y.o. male Date: 08/24/2017 Primary Care Physican: Tanna Furry, PA-C Primary Cardiologist:Allred Electrophysiologist: Allred Dry Weight: Last known ICM weight 170lb       Transmission received.   Thoracic impedance normal.  Prescribed dosage:  Furosemide 40 mg 1 tablet daily  Recommendations: None.  Follow-up plan: ICM clinic phone appointment on 09/20/2017.  Due to schedule EP appointment for December.   Copy of ICM check sent to Dr. Johney Frame.   3 month ICM trend: 08/19/2017    1 Year ICM trend:       Karie Soda, RN 08/24/2017 8:59 AM

## 2017-09-10 DIAGNOSIS — M25512 Pain in left shoulder: Secondary | ICD-10-CM | POA: Diagnosis not present

## 2017-09-13 DIAGNOSIS — M25512 Pain in left shoulder: Secondary | ICD-10-CM | POA: Diagnosis not present

## 2017-09-17 DIAGNOSIS — M25512 Pain in left shoulder: Secondary | ICD-10-CM | POA: Diagnosis not present

## 2017-09-20 ENCOUNTER — Ambulatory Visit (INDEPENDENT_AMBULATORY_CARE_PROVIDER_SITE_OTHER): Payer: Medicare HMO | Admitting: *Deleted

## 2017-09-20 DIAGNOSIS — I5022 Chronic systolic (congestive) heart failure: Secondary | ICD-10-CM | POA: Diagnosis not present

## 2017-09-20 DIAGNOSIS — I255 Ischemic cardiomyopathy: Secondary | ICD-10-CM

## 2017-09-20 DIAGNOSIS — M25512 Pain in left shoulder: Secondary | ICD-10-CM | POA: Diagnosis not present

## 2017-09-20 DIAGNOSIS — Z95810 Presence of automatic (implantable) cardiac defibrillator: Secondary | ICD-10-CM | POA: Diagnosis not present

## 2017-09-20 NOTE — Progress Notes (Signed)
EPIC Encounter for ICM Monitoring  Patient Name: Zachary Berry is a 62 y.o. male Date: 09/20/2017 Primary Care Physican: Tanna Furry, PA-C Primary Cardiologist:Allred Electrophysiologist: Allred Dry Weight: Last known ICM weight 170lb          Attempted call to patient and unable to reach.   Transmission reviewed.    Thoracic impedance normal.  Prescribed dosage: Furosemide 40 mg 1 tablet daily  Recommendations: NONE - Unable to reach.  Follow-up plan: ICM clinic phone appointment on 10/21/2017.  Copy of ICM check sent to Dr. Johney Frame.   3 month ICM trend: 09/20/2017    1 Year ICM trend:       Karie Soda, RN 09/20/2017 4:42 PM

## 2017-09-22 LAB — CUP PACEART REMOTE DEVICE CHECK
Brady Statistic AP VP Percent: 0.01 %
Brady Statistic AP VS Percent: 6.46 %
Brady Statistic AS VS Percent: 93.5 %
Brady Statistic RA Percent Paced: 6.47 %
HighPow Impedance: 437 Ohm
HighPow Impedance: 53 Ohm
HighPow Impedance: 66 Ohm
Implantable Lead Implant Date: 20111111
Implantable Lead Implant Date: 20111111
Implantable Lead Location: 753859
Implantable Lead Location: 753860
Implantable Lead Model: 6947
Lead Channel Impedance Value: 494 Ohm
Lead Channel Impedance Value: 532 Ohm
Lead Channel Pacing Threshold Amplitude: 0.75 V
Lead Channel Pacing Threshold Pulse Width: 0.4 ms
Lead Channel Pacing Threshold Pulse Width: 0.4 ms
Lead Channel Sensing Intrinsic Amplitude: 16.375 mV
Lead Channel Sensing Intrinsic Amplitude: 3.625 mV
Lead Channel Sensing Intrinsic Amplitude: 3.625 mV
Lead Channel Setting Pacing Amplitude: 2.5 V
MDC IDC MSMT BATTERY VOLTAGE: 2.65 V
MDC IDC MSMT LEADCHNL RV PACING THRESHOLD AMPLITUDE: 0.625 V
MDC IDC MSMT LEADCHNL RV SENSING INTR AMPL: 16.375 mV
MDC IDC PG IMPLANT DT: 20111111
MDC IDC SESS DTM: 20190114072825
MDC IDC SET LEADCHNL RA PACING AMPLITUDE: 2 V
MDC IDC SET LEADCHNL RV PACING PULSEWIDTH: 0.4 ms
MDC IDC SET LEADCHNL RV SENSING SENSITIVITY: 0.3 mV
MDC IDC STAT BRADY AS VP PERCENT: 0.03 %
MDC IDC STAT BRADY RV PERCENT PACED: 0.04 %

## 2017-09-22 NOTE — Progress Notes (Signed)
Remote ICD transmission.   

## 2017-09-24 ENCOUNTER — Encounter: Payer: Self-pay | Admitting: Cardiology

## 2017-09-24 DIAGNOSIS — M25512 Pain in left shoulder: Secondary | ICD-10-CM | POA: Diagnosis not present

## 2017-09-27 DIAGNOSIS — M25512 Pain in left shoulder: Secondary | ICD-10-CM | POA: Diagnosis not present

## 2017-10-01 DIAGNOSIS — M25512 Pain in left shoulder: Secondary | ICD-10-CM | POA: Diagnosis not present

## 2017-10-12 ENCOUNTER — Encounter: Payer: Self-pay | Admitting: Internal Medicine

## 2017-10-18 ENCOUNTER — Encounter (INDEPENDENT_AMBULATORY_CARE_PROVIDER_SITE_OTHER): Payer: Self-pay

## 2017-10-18 ENCOUNTER — Ambulatory Visit: Payer: Medicare HMO | Admitting: Internal Medicine

## 2017-10-18 VITALS — BP 116/76 | HR 63 | Ht 68.0 in | Wt 181.0 lb

## 2017-10-18 DIAGNOSIS — I10 Essential (primary) hypertension: Secondary | ICD-10-CM

## 2017-10-18 DIAGNOSIS — I5022 Chronic systolic (congestive) heart failure: Secondary | ICD-10-CM

## 2017-10-18 LAB — CUP PACEART INCLINIC DEVICE CHECK
Battery Voltage: 2.65 V
Brady Statistic AP VS Percent: 18.62 %
Brady Statistic AS VP Percent: 0.02 %
Brady Statistic RA Percent Paced: 18.63 %
HIGH POWER IMPEDANCE MEASURED VALUE: 437 Ohm
HighPow Impedance: 56 Ohm
HighPow Impedance: 71 Ohm
Implantable Lead Implant Date: 20111111
Implantable Lead Location: 753860
Implantable Lead Model: 6947
Implantable Pulse Generator Implant Date: 20111111
Lead Channel Impedance Value: 513 Ohm
Lead Channel Pacing Threshold Amplitude: 0.75 V
Lead Channel Pacing Threshold Amplitude: 0.75 V
Lead Channel Pacing Threshold Pulse Width: 0.4 ms
Lead Channel Pacing Threshold Pulse Width: 0.4 ms
Lead Channel Sensing Intrinsic Amplitude: 3.375 mV
Lead Channel Sensing Intrinsic Amplitude: 4 mV
Lead Channel Setting Pacing Pulse Width: 0.4 ms
MDC IDC LEAD IMPLANT DT: 20111111
MDC IDC LEAD LOCATION: 753859
MDC IDC MSMT LEADCHNL RA IMPEDANCE VALUE: 532 Ohm
MDC IDC MSMT LEADCHNL RV SENSING INTR AMPL: 16.75 mV
MDC IDC MSMT LEADCHNL RV SENSING INTR AMPL: 18.25 mV
MDC IDC SESS DTM: 20190211091914
MDC IDC SET LEADCHNL RA PACING AMPLITUDE: 2 V
MDC IDC SET LEADCHNL RV PACING AMPLITUDE: 2.5 V
MDC IDC SET LEADCHNL RV SENSING SENSITIVITY: 0.3 mV
MDC IDC STAT BRADY AP VP PERCENT: 0.01 %
MDC IDC STAT BRADY AS VS PERCENT: 81.34 %
MDC IDC STAT BRADY RV PERCENT PACED: 0.04 %

## 2017-10-18 NOTE — Patient Instructions (Addendum)
Medication Instructions:  Your physician recommends that you continue on your current medications as directed. Please refer to the Current Medication list given to you today.   Labwork: None ordered  Testing/Procedures: None ordered  Follow-Up: You have been referred to General Cardiology with [facility] in 3 months.   Remote monitoring is used to monitor your Pacemaker from home. This monitoring reduces the number of office visits required to check your device to one time per year. It allows [place] to keep an eye on the functioning of your device to ensure it is working properly. You are scheduled for a device check from home on 12/20/17. You may send your transmission at any time that day. If you have a wireless device, the transmission will be sent automatically. After your physician reviews your transmission, you will receive a postcard with your next transmission date.    Your physician wants you to follow-up in: 1 year with Dr. Johney Frame. You will receive a reminder letter in the mail two months in advance. If you don't receive a letter, please call our office to schedule the follow-up appointment.   Any Other Special Instructions Will Be Listed Below (If Applicable).     If you need a refill on your cardiac medications before your next appointment, please call your pharmacy.

## 2017-10-18 NOTE — Progress Notes (Signed)
PCP: Tanna Furry, PA-C Primary Cardiologist:  Previously Shirlee Latch Primary EP: Dr Roland Earl is a 62 y.o. male who presents today for routine electrophysiology followup.  Since last being seen in our clinic, the patient reports doing very well.  Today, he denies symptoms of palpitations, chest pain, shortness of breath,  lower extremity edema, dizziness, presyncope, syncope, or ICD shocks.  The patient is otherwise without complaint today.   Past Medical History:  Diagnosis Date  . CAD (coronary artery disease)    a. s/p Ant STEMI >> LHC (04/03/2009): EF 30%, proximal LAD occluded >>> PCI: Xience DES to prox LAD.;  b.  LHC (9/15): Proximal LAD stent patent  . Chronic systolic heart failure (HCC)   . Depression   . Diabetes mellitus   . HTN (hypertension)   . Hyperlipidemia   . Ischemic cardiomyopathy    a.  Echo (12/2012): EF 30-35%, anteroseptal hypokinesis, mild LAE;   b.  Cardiac MRI (06/2010): Full thickness scar of the dist ant wall, septum, apex with 2/3 thickness scar of mid and basal ant wall, EF 34%.  . Learning disability   . Poor vision   . S/P implantation of automatic cardioverter/defibrillator (AICD)    Past Surgical History:  Procedure Laterality Date  . HAND SURGERY    . ICD Implant  07/18/10   MDT ICD implanted by Dr Johney Frame  . LEFT HEART CATHETERIZATION WITH CORONARY ANGIOGRAM N/A 05/25/2014   Procedure: LEFT HEART CATHETERIZATION WITH CORONARY ANGIOGRAM;  Surgeon: Thurmon Fair, MD;  Location: [facility] CATH LAB;  Service: Cardiovascular;  Laterality: N/A;  . PCI LAD  7/10    ROS- all systems are reviewed and negative except as per HPI above  Current Outpatient Medications  Medication Sig Dispense Refill  . aspirin EC 81 MG tablet Take 81 mg by mouth daily.    . clopidogrel (PLAVIX) 75 MG tablet Take 1 tablet (75 mg total) by mouth daily. 90 tablet 1  . enalapril (VASOTEC) 20 MG tablet Take 1 tablet (20 mg total) by mouth 2 (two) times daily. 60  tablet 0  . fenofibrate 160 MG tablet Take 160 mg by mouth daily.    . furosemide (LASIX) 40 MG tablet TAKE 1 TABLET BY MOUTH ONCE DAILY. 90 tablet 1  . glipiZIDE (GLUCOTROL) 10 MG tablet Take 10 mg by mouth 2 (two) times daily before a meal.      . insulin glargine (LANTUS) 100 UNIT/ML injection Inject 45 Units into the skin at bedtime as needed (for blood sugar).     . metFORMIN (GLUCOPHAGE) 1000 MG tablet Take 1 tablet by mouth 2 (two) times daily.    . nitroGLYCERIN (NITROSTAT) 0.4 MG SL tablet Place 1 tablet (0.4 mg total) under the tongue every 5 (five) minutes as needed for chest pain. 25 tablet 3  . NON FORMULARY 1 each by Other route See admin instructions. Use CPAP machine nightly.    . rosuvastatin (CRESTOR) 40 MG tablet Take 1 tablet (40 mg total) by mouth daily. 30 tablet 6  . sertraline (ZOLOFT) 100 MG tablet Take 100 mg by mouth 2 (two) times daily.     Marland Kitchen spironolactone (ALDACTONE) 25 MG tablet Take 1 tablet (25 mg total) by mouth daily. 30 tablet 6  . carvedilol (COREG) 25 MG tablet Take 1 tablet (25 mg total) by mouth 2 (two) times daily. 60 tablet 6   No current facility-administered medications for this visit.     Physical Exam: Vitals:  10/18/17 0822  BP: 116/76  Pulse: 63  Weight: 181 lb (82.1 kg)  Height: 5\' 8"  (1.727 m)    GEN- The patient is well appearing, alert and oriented x 3 today.   Head- normocephalic, atraumatic Eyes-  Sclera clear, conjunctiva pink Ears- hearing intact Oropharynx- clear Lungs- Clear to ausculation bilaterally, normal work of breathing Chest- ICD pocket is well healed Heart- Regular rate and rhythm, no murmurs, rubs or gallops, PMI not laterally displaced GI- soft, NT, ND, + BS Extremities- no clubbing, cyanosis, or edema  ICD interrogation- reviewed in detail today,  See PACEART report  ekg tracing ordered today is personally reviewed and shows sinus rhythm, septal infarct, LAD  Assessment and Plan:  1.  Chronic systolic  dysfunction/ ischemic CM/ CAD euvolemic today No ischemic symptoms Stable on an appropriate medical regimen Normal ICD function See Pace Art report No changes today Would likely benefit from entresto Will refer him back to general cardiology to update echo and for additional medical management of his CHF/ CAD Of note, he has not seen general cardiology since 2015.  He is reluctant to comply due to visit copays. He is approaching ERI on his ICD (about 6 months remains).  He is aware that if he hears the alert tone that he should call the device clinic. Risks, benefits, and alternatives to ICD pulse generator replacement were discussed in detail today.  The patient understands that risks include but are not limited to bleeding, infection, pneumothorax, perforation, tamponade, vascular damage, renal failure, MI, stroke, death, inappropriate shocks, damage to his existing leads, and lead dislodgement.  Once ERI, ok to schedule generator change without an additional office visit required. Will need updated echo prior to generator change.  2. HTN Stable No change required today   Continue carelink monitoring Return to see me in the device clinic every year to see EP NP  Hillis Range MD, [facility] 10/18/2017 8:38 AM

## 2017-10-21 ENCOUNTER — Ambulatory Visit (INDEPENDENT_AMBULATORY_CARE_PROVIDER_SITE_OTHER): Payer: Self-pay

## 2017-10-21 DIAGNOSIS — Z95810 Presence of automatic (implantable) cardiac defibrillator: Secondary | ICD-10-CM

## 2017-10-21 DIAGNOSIS — I5022 Chronic systolic (congestive) heart failure: Secondary | ICD-10-CM

## 2017-10-21 NOTE — Progress Notes (Signed)
EPIC Encounter for ICM Monitoring  Patient Name: Zachary Berry is a 62 y.o. male Date: 10/21/2017 Primary Care Physican: Tanna Furry, PA-C Primary Cardiologist:Allred Electrophysiologist: Allred Dry Weight:181lbs (office visit weight 2/11)         No call to patient as he had defib office check 10/21/17   Thoracic impedance normal.  Prescribed dosage: Furosemide 40 mg 1 tablet daily  Recommendations: None  Follow-up plan: ICM clinic phone appointment on 11/18/2017.    Copy of ICM check sent to Dr. Johney Frame.   3 month ICM trend: 10/21/2017     1 Year ICM trend:       Karie Soda, RN 10/21/2017 9:47 AM

## 2017-11-18 ENCOUNTER — Ambulatory Visit (INDEPENDENT_AMBULATORY_CARE_PROVIDER_SITE_OTHER): Payer: Medicare HMO

## 2017-11-18 DIAGNOSIS — I5022 Chronic systolic (congestive) heart failure: Secondary | ICD-10-CM | POA: Diagnosis not present

## 2017-11-18 DIAGNOSIS — Z95810 Presence of automatic (implantable) cardiac defibrillator: Secondary | ICD-10-CM

## 2017-11-18 NOTE — Progress Notes (Signed)
EPIC Encounter for ICM Monitoring  Patient Name: Zachary Berry is a 62 y.o. male Date: 11/18/2017 Primary Care Physican: Tanna Furry, PA-C Primary Cardiologist:Allred Electrophysiologist: Allred Dry Weight:178lbs       Heart Failure questions reviewed, pt asymptomatic.   Thoracic impedance normal.  Prescribed dosage: Furosemide 40 mg 1 tablet daily  Recommendations: No changes.  Encouraged to call for fluid symptoms.   Follow-up plan: ICM clinic phone appointment on 12/20/2017.    Copy of ICM check sent to Dr. Johney Frame.   3 month ICM trend: 11/18/2017    1 Year ICM trend:       Karie Soda, RN 11/18/2017 9:23 AM

## 2017-11-23 DIAGNOSIS — E119 Type 2 diabetes mellitus without complications: Secondary | ICD-10-CM | POA: Diagnosis not present

## 2017-11-23 DIAGNOSIS — I10 Essential (primary) hypertension: Secondary | ICD-10-CM | POA: Diagnosis not present

## 2017-11-23 DIAGNOSIS — F329 Major depressive disorder, single episode, unspecified: Secondary | ICD-10-CM | POA: Diagnosis not present

## 2017-11-23 DIAGNOSIS — E785 Hyperlipidemia, unspecified: Secondary | ICD-10-CM | POA: Diagnosis not present

## 2017-12-14 DIAGNOSIS — E119 Type 2 diabetes mellitus without complications: Secondary | ICD-10-CM | POA: Diagnosis not present

## 2017-12-20 ENCOUNTER — Ambulatory Visit (INDEPENDENT_AMBULATORY_CARE_PROVIDER_SITE_OTHER): Payer: Medicare HMO | Admitting: *Deleted

## 2017-12-20 DIAGNOSIS — I5022 Chronic systolic (congestive) heart failure: Secondary | ICD-10-CM

## 2017-12-20 DIAGNOSIS — Z95810 Presence of automatic (implantable) cardiac defibrillator: Secondary | ICD-10-CM | POA: Diagnosis not present

## 2017-12-20 DIAGNOSIS — I255 Ischemic cardiomyopathy: Secondary | ICD-10-CM

## 2017-12-20 NOTE — Progress Notes (Signed)
EPIC Encounter for ICM Monitoring  Patient Name: Zachary Berry is a 62 y.o. male Date: 12/20/2017 Primary Care Physican: Tanna Furry, PA-C Primary Cardiologist:Allred Electrophysiologist: Allred Dry Weight:178lbs         Heart Failure questions reviewed, pt asymptomatic.  Patient expects to reach ERI soon.    Thoracic impedance normal but was abnormal suggesting fluid accumulation from 12/03/2017 through 12/13/2017.  Prescribed dosage: Furosemide 40 mg 1 tablet daily  Recommendations: No changes.   Encouraged to call for fluid symptoms.  Follow-up plan: ICM clinic phone appointment on 01/20/2018.  Office appointment scheduled 01/18/2018 with Dr. Eldridge Dace (1st visit).  Copy of ICM check sent to Dr. Johney Frame.   3 month ICM trend: 12/20/2017    1 Year ICM trend:       Karie Soda, RN 12/20/2017 2:46 PM

## 2017-12-21 NOTE — Progress Notes (Signed)
Remote ICD transmission.   

## 2017-12-22 ENCOUNTER — Encounter: Payer: Self-pay | Admitting: Cardiology

## 2017-12-22 LAB — CUP PACEART REMOTE DEVICE CHECK
Battery Voltage: 2.63 V
Brady Statistic AP VP Percent: 0.01 %
Brady Statistic AS VP Percent: 0.02 %
Brady Statistic RA Percent Paced: 16.12 %
Date Time Interrogation Session: 20190415062724
HIGH POWER IMPEDANCE MEASURED VALUE: 72 Ohm
HighPow Impedance: 437 Ohm
HighPow Impedance: 56 Ohm
Implantable Lead Implant Date: 20111111
Implantable Lead Location: 753859
Implantable Lead Model: 6947
Implantable Pulse Generator Implant Date: 20111111
Lead Channel Impedance Value: 513 Ohm
Lead Channel Impedance Value: 513 Ohm
Lead Channel Pacing Threshold Amplitude: 0.625 V
Lead Channel Pacing Threshold Pulse Width: 0.4 ms
Lead Channel Sensing Intrinsic Amplitude: 14.75 mV
Lead Channel Sensing Intrinsic Amplitude: 3.625 mV
Lead Channel Setting Pacing Amplitude: 2 V
Lead Channel Setting Pacing Pulse Width: 0.4 ms
MDC IDC LEAD IMPLANT DT: 20111111
MDC IDC LEAD LOCATION: 753860
MDC IDC MSMT LEADCHNL RA PACING THRESHOLD AMPLITUDE: 0.75 V
MDC IDC MSMT LEADCHNL RA SENSING INTR AMPL: 3.625 mV
MDC IDC MSMT LEADCHNL RV PACING THRESHOLD PULSEWIDTH: 0.4 ms
MDC IDC MSMT LEADCHNL RV SENSING INTR AMPL: 14.75 mV
MDC IDC SET LEADCHNL RV PACING AMPLITUDE: 2.5 V
MDC IDC SET LEADCHNL RV SENSING SENSITIVITY: 0.3 mV
MDC IDC STAT BRADY AP VS PERCENT: 16.11 %
MDC IDC STAT BRADY AS VS PERCENT: 83.86 %
MDC IDC STAT BRADY RV PERCENT PACED: 0.04 %

## 2017-12-31 ENCOUNTER — Encounter: Payer: Self-pay | Admitting: Interventional Cardiology

## 2018-01-17 NOTE — Progress Notes (Signed)
Cardiology Office Note   Date:  01/18/2018   ID:  Zachary Berry, DOB 01-25-1956, MRN [id number]  PCP:  Tanna Furry, PA-C    No chief complaint on file.  Chronic systolic heart failure  Wt Readings from Last 3 Encounters:  01/18/18 180 lb (81.6 kg)  10/18/17 181 lb (82.1 kg)  08/13/16 176 lb (79.8 kg)       History of Present Illness: Zachary Berry is a 62 y.o. male  With a h/o anterior MI, and ischemic cardiomyopathy.  Prior caths showed: s/p Ant STEMI >> LHC (04/03/2009): EF 30%, proximal LAD occluded >>> PCI: Xience DES to prox LAD.;  b.  LHC (9/15): Proximal LAD stent patent.  Prior echo showed:  Ischemic cardiomyopathy     a.  Echo (12/2012): EF 30-35%, anteroseptal hypokinesis, mild LAE;   b.  Cardiac MRI (06/2010): Full thickness scar of the dist ant wall, septum, apex with 2/3 thickness scar of mid and basal ant wall, EF 34%.   Seen by Dr. Johney Frame who wanted him seen by General cardiology.   Since 2010, he has not had any further revascularization.  In 2011, he had an AICD placed.  It has never discharged.   Denies : Chest pain. Dizziness. Leg edema. Nitroglycerin use. Orthopnea. Palpitations. Paroxysmal nocturnal dyspnea. Shortness of breath. Syncope.   He does walk on the treadmill for exercise.  He does about 12-15 minutes/daily.  He walks outside as well.    Past Medical History:  Diagnosis Date  . CAD (coronary artery disease)    a. s/p Ant STEMI >> LHC (04/03/2009): EF 30%, proximal LAD occluded >>> PCI: Xience DES to prox LAD.;  b.  LHC (9/15): Proximal LAD stent patent  . Chronic systolic heart failure (HCC)   . Depression   . Diabetes mellitus   . HTN (hypertension)   . Hyperlipidemia   . Ischemic cardiomyopathy    a.  Echo (12/2012): EF 30-35%, anteroseptal hypokinesis, mild LAE;   b.  Cardiac MRI (06/2010): Full thickness scar of the dist ant wall, septum, apex with 2/3 thickness scar of mid and basal ant wall, EF 34%.  .  Learning disability   . Poor vision   . S/P implantation of automatic cardioverter/defibrillator (AICD)     Past Surgical History:  Procedure Laterality Date  . HAND SURGERY    . ICD Implant  07/18/10   MDT ICD implanted by Dr Johney Frame  . LEFT HEART CATHETERIZATION WITH CORONARY ANGIOGRAM N/A 05/25/2014   Procedure: LEFT HEART CATHETERIZATION WITH CORONARY ANGIOGRAM;  Surgeon: Thurmon Fair, MD;  Location: [facility]ATH [facility];  Service: Cardiovascular;  Laterality: N/A;  . PCI LAD  7/10     Current Outpatient Medications  Medication Sig Dispense Refill  . aspirin EC 81 MG tablet Take 81 mg by mouth daily.    . carvedilol (COREG) 25 MG tablet Take 25 mg by mouth 2 (two) times daily with a meal.    . clopidogrel (PLAVIX) 75 MG tablet Take 1 tablet (75 mg total) by mouth daily. 90 tablet 1  . enalapril (VASOTEC) 20 MG tablet Take 1 tablet (20 mg total) by mouth 2 (two) times daily. 60 tablet 0  . fenofibrate (TRICOR) 145 MG tablet Take 145 mg by mouth daily.    . furosemide (LASIX) 40 MG tablet TAKE 1 TABLET BY MOUTH ONCE DAILY. 90 tablet 1  . glipiZIDE (GLUCOTROL) 10 MG tablet Take 10 mg by mouth 2 (two) times daily before a  meal.      . insulin glargine (LANTUS) 100 UNIT/ML injection Inject 45 Units into the skin at bedtime as needed (for blood sugar).     . metFORMIN (GLUCOPHAGE) 1000 MG tablet Take 1 tablet by mouth 2 (two) times daily.    . nitroGLYCERIN (NITROSTAT) 0.4 MG SL tablet Place 1 tablet (0.4 mg total) under the tongue every 5 (five) minutes as needed for chest pain. 25 tablet 3  . rosuvastatin (CRESTOR) 40 MG tablet Take 1 tablet (40 mg total) by mouth daily. 30 tablet 6  . sertraline (ZOLOFT) 100 MG tablet Take 100 mg by mouth 2 (two) times daily.     Marland Kitchen spironolactone (ALDACTONE) 25 MG tablet Take 1 tablet (25 mg total) by mouth daily. 30 tablet 6   No current facility-administered medications for this visit.     Allergies:   Patient has no known allergies.    Social  History:  The patient  reports that he has never smoked. He has never used smokeless tobacco. He reports that he does not drink alcohol or use drugs.   Family History:  The patient's family history includes Diabetes in his brother, father, mother, and sister; Heart attack (age of onset: 10) in his brother; Heart disease in his mother; Heart failure in his mother; Hypertension in his father; Stomach cancer in his unknown relative; Stroke in his mother; Sudden death in his brother.    ROS:  Please see the history of present illness.   Otherwise, review of systems are positive for occasional fatigue.   All other systems are reviewed and negative.    PHYSICAL EXAM: VS:  BP 126/74   Pulse 61   Ht 5\' 8"  (1.727 m)   Wt 180 lb (81.6 kg)   SpO2 97%   BMI 27.37 kg/m  , BMI Body mass index is 27.37 kg/m. GEN: Well nourished, well developed, in no acute distress  HEENT: normal  Neck: no JVD, carotid bruits, or masses Cardiac: RRR; no murmurs, rubs, or gallops,no edema  Respiratory:  clear to auscultation bilaterally, normal work of breathing GI: soft, nontender, nondistended, + BS MS: no deformity or atrophy  Skin: warm and dry, no rash Neuro:  Strength and sensation are intact Psych: euthymic mood, full affect    Recent Labs: No results found for requested labs within last 8760 hours.   Lipid Panel    Component Value Date/Time   CHOL 105 08/13/2016 0955   TRIG 132 08/13/2016 0955   HDL 39 (L) 08/13/2016 0955   CHOLHDL 2.7 08/13/2016 0955   VLDL 26 08/13/2016 0955   LDLCALC 40 08/13/2016 0955     Other studies Reviewed: Additional studies/ records that were reviewed today with results demonstrating: Old cath records reviewed.   ASSESSMENT AND PLAN:  1. CAD: No angina on medical therapy.  COntinue secondary prevention.  No bleeding issues with DAPT.   2. Chronic systolic heart failure: He appears euvolemic.  Low salt diet.  Moderation with the iced tea. 3. AICD: Will need  battery change soon.   4. HTN: The current medical regimen is effective;  continue present plan and medications. 5. DM: A1C 9.6.  Trying to improve diet.  Increase exercise.  Managed by PMD.     Current medicines are reviewed at length with the patient today.  The patient concerns regarding his medicines were addressed.  The following changes have been made:  No change  Labs/ tests ordered today include:  No orders of the defined  types were placed in this encounter.   Recommend 150 minutes/week of aerobic exercise Low fat, low carb, high fiber diet recommended  Disposition:   FU in 6 months- recheck labs at that time [facility])   Signed, Lance Muss, MD  01/18/2018 8:45 AM    [facility] [address] [place], [place], [place]  [address] Phone: [phone]; Fax: [phone]

## 2018-01-18 ENCOUNTER — Encounter: Payer: Self-pay | Admitting: Interventional Cardiology

## 2018-01-18 ENCOUNTER — Ambulatory Visit: Payer: Medicare HMO | Admitting: Interventional Cardiology

## 2018-01-18 VITALS — BP 126/74 | HR 61 | Ht 68.0 in | Wt 180.0 lb

## 2018-01-18 DIAGNOSIS — I25118 Atherosclerotic heart disease of native coronary artery with other forms of angina pectoris: Secondary | ICD-10-CM

## 2018-01-18 DIAGNOSIS — Z95810 Presence of automatic (implantable) cardiac defibrillator: Secondary | ICD-10-CM

## 2018-01-18 DIAGNOSIS — I5022 Chronic systolic (congestive) heart failure: Secondary | ICD-10-CM

## 2018-01-18 DIAGNOSIS — I10 Essential (primary) hypertension: Secondary | ICD-10-CM

## 2018-01-18 DIAGNOSIS — I255 Ischemic cardiomyopathy: Secondary | ICD-10-CM | POA: Diagnosis not present

## 2018-01-18 NOTE — Patient Instructions (Signed)

## 2018-01-20 ENCOUNTER — Ambulatory Visit (INDEPENDENT_AMBULATORY_CARE_PROVIDER_SITE_OTHER): Payer: Medicare HMO

## 2018-01-20 DIAGNOSIS — Z95810 Presence of automatic (implantable) cardiac defibrillator: Secondary | ICD-10-CM | POA: Diagnosis not present

## 2018-01-20 DIAGNOSIS — I5022 Chronic systolic (congestive) heart failure: Secondary | ICD-10-CM | POA: Diagnosis not present

## 2018-01-21 NOTE — Progress Notes (Signed)
EPIC Encounter for ICM Monitoring  Patient Name: Zachary Berry is a 62 y.o. male Date: 01/21/2018 Primary Care Physican: Tanna Furry, PA-C Primary Cardiologist:[place] Electrophysiologist: Allred Dry Weight:180lbs      Heart Failure questions reviewed, pt asymptomatic.  He has been drinking a lot of iced tea and may be causing the fluid retention   Thoracic impedance abnormal suggesting fluid accumulation since 01/15/2018 but is trending to baseline.  Prescribed dosage: Furosemide 40 mg 1 tablet daily  Recommendations:  Reinforced fluid restriction to < 2 L daily.  Encouraged to call for fluid symptoms.  Follow-up plan: ICM clinic phone appointment on 02/07/2018 to recheck fluid levels.    Copy of ICM check sent to Dr. Johney Frame and Dr. Eldridge Dace.   3 month ICM trend: 01/20/2018    1 Year ICM trend:       Karie Soda, RN 01/21/2018 9:17 AM

## 2018-02-07 ENCOUNTER — Ambulatory Visit (INDEPENDENT_AMBULATORY_CARE_PROVIDER_SITE_OTHER): Payer: Self-pay

## 2018-02-07 DIAGNOSIS — I5022 Chronic systolic (congestive) heart failure: Secondary | ICD-10-CM

## 2018-02-07 DIAGNOSIS — Z95810 Presence of automatic (implantable) cardiac defibrillator: Secondary | ICD-10-CM

## 2018-02-08 NOTE — Progress Notes (Signed)
EPIC Encounter for ICM Monitoring  Patient Name: Zachary Berry is a 62 y.o. male Date: 02/08/2018 Primary Care Physican: Tanna Furry, PA-C Primary Cardiologist:Var[place] Electrophysiologist: Allred Dry Weight:177lbs- 180 lbs      Heart Failure questions reviewed, pt asymptomatic.   Thoracic impedance abnormal suggesting fluid accumulation since 01/22/2018.  Prescribed dosage: Furosemide 40 mg 1 tablet daily  Recommendations: No changes.  He likes to drink a lot of tea in the summer.  Reinforced fluid restriction to < 2 L daily and sodium restriction to less than 2000 mg daily.  Encouraged to call for fluid symptoms.  Follow-up plan: ICM clinic phone appointment on 02/11/2018 to recheck fluid levels.    Copy of ICM check sent to Dr. Johney Frame and Dr. Eldridge Dace.   3 month ICM trend: 02/07/2018    1 Year ICM trend:       Karie Soda, RN 02/08/2018 9:44 AM

## 2018-02-11 ENCOUNTER — Ambulatory Visit (INDEPENDENT_AMBULATORY_CARE_PROVIDER_SITE_OTHER): Payer: Self-pay

## 2018-02-11 DIAGNOSIS — Z95810 Presence of automatic (implantable) cardiac defibrillator: Secondary | ICD-10-CM

## 2018-02-11 DIAGNOSIS — I5022 Chronic systolic (congestive) heart failure: Secondary | ICD-10-CM

## 2018-02-11 NOTE — Progress Notes (Signed)
EPIC Encounter for ICM Monitoring  Patient Name: Zachary Berry is a 62 y.o. male Date: 02/11/2018 Primary Care Physican: Tanna Furry, PA-C Primary Cardiologist:Var[place] Electrophysiologist: Allred Dry Weight:177lbs- 180 lbs        Heart Failure questions reviewed, pt asymptomatic.  He likes to drink a lot of tea in the summer   Thoracic impedance returned to normal since last ICM transmission on 02/08/2018.  Prescribed dosage: Furosemide 40 mg 1 tablet daily  Recommendations: No changes.  He reduced fluid intake as discussed.  Encouraged to call for fluid symptoms.  Follow-up plan: ICM clinic phone appointment on 03/03/2018.    Copy of ICM check sent to Dr. Johney Frame.   3 month ICM trend: 02/11/2018    1 Year ICM trend:       Karie Soda, RN 02/11/2018 7:50 AM

## 2018-03-03 ENCOUNTER — Ambulatory Visit (INDEPENDENT_AMBULATORY_CARE_PROVIDER_SITE_OTHER): Payer: Medicare HMO

## 2018-03-03 DIAGNOSIS — Z95810 Presence of automatic (implantable) cardiac defibrillator: Secondary | ICD-10-CM

## 2018-03-03 DIAGNOSIS — I5022 Chronic systolic (congestive) heart failure: Secondary | ICD-10-CM

## 2018-03-03 NOTE — Progress Notes (Signed)
EPIC Encounter for ICM Monitoring  Patient Name: Zachary Berry is a 62 y.o. male Date: 03/03/2018 Primary Care Physican: Tanna Furry, PA-C Primary Cardiologist:Varanasi Electrophysiologist: Allred Dry Weight: 174 lbs      Heart Failure questions reviewed, pt asymptomatic.   Thoracic impedance normal  Prescribed dosage: Furosemide 40 mg 1 tablet daily  Recommendations: No changes.  Encouraged to call for fluid symptoms.  Follow-up plan: ICM clinic phone appointment on 04/07/2018.    Copy of ICM check sent to Dr. Johney Frame.   3 month ICM trend: 03/03/2018    1 Year ICM trend:       Karie Soda, RN 03/03/2018 2:50 PM

## 2018-03-04 DIAGNOSIS — E119 Type 2 diabetes mellitus without complications: Secondary | ICD-10-CM | POA: Diagnosis not present

## 2018-03-04 DIAGNOSIS — I10 Essential (primary) hypertension: Secondary | ICD-10-CM | POA: Diagnosis not present

## 2018-03-04 DIAGNOSIS — E785 Hyperlipidemia, unspecified: Secondary | ICD-10-CM | POA: Diagnosis not present

## 2018-03-04 DIAGNOSIS — Z1331 Encounter for screening for depression: Secondary | ICD-10-CM | POA: Diagnosis not present

## 2018-03-04 DIAGNOSIS — F329 Major depressive disorder, single episode, unspecified: Secondary | ICD-10-CM | POA: Diagnosis not present

## 2018-03-18 DIAGNOSIS — E119 Type 2 diabetes mellitus without complications: Secondary | ICD-10-CM | POA: Diagnosis not present

## 2018-04-07 ENCOUNTER — Ambulatory Visit (INDEPENDENT_AMBULATORY_CARE_PROVIDER_SITE_OTHER): Payer: Medicare HMO

## 2018-04-07 ENCOUNTER — Telehealth: Payer: Self-pay

## 2018-04-07 ENCOUNTER — Ambulatory Visit (INDEPENDENT_AMBULATORY_CARE_PROVIDER_SITE_OTHER): Payer: Medicare HMO | Admitting: *Deleted

## 2018-04-07 DIAGNOSIS — Z95810 Presence of automatic (implantable) cardiac defibrillator: Secondary | ICD-10-CM

## 2018-04-07 DIAGNOSIS — I255 Ischemic cardiomyopathy: Secondary | ICD-10-CM | POA: Diagnosis not present

## 2018-04-07 DIAGNOSIS — I5022 Chronic systolic (congestive) heart failure: Secondary | ICD-10-CM

## 2018-04-07 NOTE — Telephone Encounter (Signed)
Attempted to confirm remote transmission with pt. No answer and was unable to leave a message.   

## 2018-04-08 ENCOUNTER — Telehealth: Payer: Self-pay

## 2018-04-08 NOTE — Telephone Encounter (Signed)
Remote ICM transmission received.  Attempted call to patient and no voice mail set up 

## 2018-04-08 NOTE — Progress Notes (Signed)
Remote ICD transmission.   

## 2018-04-08 NOTE — Progress Notes (Signed)
EPIC Encounter for ICM Monitoring  Patient Name: Zachary Berry is a 62 y.o. male Date: 04/08/2018 Primary Care Physican: Tanna Furry, PA-C Primary Cardiologist:Varanasi Electrophysiologist: Allred Dry Weight: Previous weight 174 lbs       Attempted call to patient and unable to reach.    Transmission reviewed.    Thoracic impedance normal.  Prescribed dosage: Furosemide 40 mg 1 tablet daily  Recommendations:NONE - Unable to reach.  Follow-up plan: ICM clinic phone appointment on 05/12/2018.    Copy of ICM check sent to Dr. Johney Frame.   3 month ICM trend: 04/07/2018    1 Year ICM trend:       Karie Soda, RN 04/08/2018 11:31 AM

## 2018-04-12 DIAGNOSIS — E119 Type 2 diabetes mellitus without complications: Secondary | ICD-10-CM | POA: Diagnosis not present

## 2018-05-05 LAB — CUP PACEART REMOTE DEVICE CHECK
Battery Voltage: 2.62 V
Brady Statistic AP VP Percent: 0.01 %
Brady Statistic AP VS Percent: 8.66 %
Brady Statistic RA Percent Paced: 8.67 %
Brady Statistic RV Percent Paced: 0.04 %
HIGH POWER IMPEDANCE MEASURED VALUE: 437 Ohm
HIGH POWER IMPEDANCE MEASURED VALUE: 56 Ohm
HighPow Impedance: 71 Ohm
Implantable Lead Implant Date: 20111111
Implantable Lead Implant Date: 20111111
Implantable Lead Location: 753860
Implantable Lead Model: 5076
Implantable Lead Model: 6947
Implantable Pulse Generator Implant Date: 20111111
Lead Channel Impedance Value: 494 Ohm
Lead Channel Pacing Threshold Amplitude: 0.75 V
Lead Channel Pacing Threshold Pulse Width: 0.4 ms
Lead Channel Sensing Intrinsic Amplitude: 14.625 mV
Lead Channel Sensing Intrinsic Amplitude: 14.625 mV
Lead Channel Sensing Intrinsic Amplitude: 3.625 mV
Lead Channel Sensing Intrinsic Amplitude: 3.625 mV
Lead Channel Setting Pacing Amplitude: 2 V
Lead Channel Setting Pacing Amplitude: 2.5 V
Lead Channel Setting Sensing Sensitivity: 0.3 mV
MDC IDC LEAD LOCATION: 753859
MDC IDC MSMT LEADCHNL RA IMPEDANCE VALUE: 532 Ohm
MDC IDC MSMT LEADCHNL RV PACING THRESHOLD AMPLITUDE: 0.625 V
MDC IDC MSMT LEADCHNL RV PACING THRESHOLD PULSEWIDTH: 0.4 ms
MDC IDC SESS DTM: 20190801223529
MDC IDC SET LEADCHNL RV PACING PULSEWIDTH: 0.4 ms
MDC IDC STAT BRADY AS VP PERCENT: 0.03 %
MDC IDC STAT BRADY AS VS PERCENT: 91.3 %

## 2018-05-12 ENCOUNTER — Ambulatory Visit (INDEPENDENT_AMBULATORY_CARE_PROVIDER_SITE_OTHER): Payer: Medicare HMO

## 2018-05-12 DIAGNOSIS — I5022 Chronic systolic (congestive) heart failure: Secondary | ICD-10-CM | POA: Diagnosis not present

## 2018-05-12 DIAGNOSIS — Z95810 Presence of automatic (implantable) cardiac defibrillator: Secondary | ICD-10-CM | POA: Diagnosis not present

## 2018-05-13 NOTE — Progress Notes (Signed)
EPIC Encounter for ICM Monitoring  Patient Name: Zachary Berry is a 62 y.o. male Date: 05/13/2018 Primary Care Physican: Tanna Furry, PA-C Primary Cardiologist:Varanasi Electrophysiologist: Allred Dry Weight: 170 lbs       Heart Failure questions reviewed, pt asymptomatic.   Thoracic impedance normal.  Prescribed dosage: Furosemide 40 mg 1 tablet daily  Recommendations: No changes.   Encouraged to call for fluid symptoms.  Follow-up plan: ICM clinic phone appointment on 06/13/2018.     Copy of ICM check sent to Dr. Johney Frame.   3 month ICM trend: 05/12/2018    1 Year ICM trend:       Karie Soda, RN 05/13/2018 5:12 PM

## 2018-06-01 DIAGNOSIS — Z9181 History of falling: Secondary | ICD-10-CM | POA: Diagnosis not present

## 2018-06-01 DIAGNOSIS — Z0000 Encounter for general adult medical examination without abnormal findings: Secondary | ICD-10-CM | POA: Diagnosis not present

## 2018-06-01 DIAGNOSIS — Z125 Encounter for screening for malignant neoplasm of prostate: Secondary | ICD-10-CM | POA: Diagnosis not present

## 2018-06-01 DIAGNOSIS — Z1339 Encounter for screening examination for other mental health and behavioral disorders: Secondary | ICD-10-CM | POA: Diagnosis not present

## 2018-06-01 DIAGNOSIS — Z1331 Encounter for screening for depression: Secondary | ICD-10-CM | POA: Diagnosis not present

## 2018-06-01 DIAGNOSIS — E785 Hyperlipidemia, unspecified: Secondary | ICD-10-CM | POA: Diagnosis not present

## 2018-06-14 ENCOUNTER — Ambulatory Visit (INDEPENDENT_AMBULATORY_CARE_PROVIDER_SITE_OTHER): Payer: Medicare HMO

## 2018-06-14 ENCOUNTER — Telehealth: Payer: Self-pay

## 2018-06-14 DIAGNOSIS — I5022 Chronic systolic (congestive) heart failure: Secondary | ICD-10-CM | POA: Diagnosis not present

## 2018-06-14 DIAGNOSIS — Z95810 Presence of automatic (implantable) cardiac defibrillator: Secondary | ICD-10-CM

## 2018-06-14 NOTE — Telephone Encounter (Signed)
Remote ICM transmission received.  Attempted call to patient and no answer or answering machine.  

## 2018-06-14 NOTE — Progress Notes (Signed)
EPIC Encounter for ICM Monitoring  Patient Name: Zachary Berry is a 62 y.o. male Date: 06/14/2018 Primary Care Physican: Tanna Furry, PA-C Primary Cardiologist:Var[place]asi Electrophysiologist: Allred Dry Weight:Previous weight 170 lbs       Attempted call to patient and unable to reach.    Transmission reviewed.    Thoracic impedance abnormal suggesting fluid accumulation but trending back to baseline.   Prescribed: Furosemide 40 mg 1 tablet daily  Recommendations: Unable to reach.  Follow-up plan: ICM clinic phone appointment on 06/23/2018 to recheck fluid levels.    Copy of ICM check sent to Dr. Johney Frame and Dr Eldridge Dace.   3 month ICM trend: 06/14/2018    1 Year ICM trend:      Karie Soda, RN 06/14/2018 3:07 PM

## 2018-06-23 ENCOUNTER — Ambulatory Visit (INDEPENDENT_AMBULATORY_CARE_PROVIDER_SITE_OTHER): Payer: Self-pay

## 2018-06-23 DIAGNOSIS — Z95810 Presence of automatic (implantable) cardiac defibrillator: Secondary | ICD-10-CM

## 2018-06-23 DIAGNOSIS — I5022 Chronic systolic (congestive) heart failure: Secondary | ICD-10-CM

## 2018-06-23 DIAGNOSIS — Z125 Encounter for screening for malignant neoplasm of prostate: Secondary | ICD-10-CM | POA: Diagnosis not present

## 2018-06-23 DIAGNOSIS — I10 Essential (primary) hypertension: Secondary | ICD-10-CM | POA: Diagnosis not present

## 2018-06-23 DIAGNOSIS — F329 Major depressive disorder, single episode, unspecified: Secondary | ICD-10-CM | POA: Diagnosis not present

## 2018-06-23 DIAGNOSIS — Z23 Encounter for immunization: Secondary | ICD-10-CM | POA: Diagnosis not present

## 2018-06-23 DIAGNOSIS — E785 Hyperlipidemia, unspecified: Secondary | ICD-10-CM | POA: Diagnosis not present

## 2018-06-23 DIAGNOSIS — E119 Type 2 diabetes mellitus without complications: Secondary | ICD-10-CM | POA: Diagnosis not present

## 2018-06-24 NOTE — Progress Notes (Signed)
EPIC Encounter for ICM Monitoring  Patient Name: Zachary Berry is a 62 y.o. male Date: 06/24/2018 Primary Care Physican: Tanna Furry, PA-C Primary Cardiologist:Varanasi Electrophysiologist: Allred Dry Weight:171.6lbs               Heart Failure questions reviewed, pt asymptomatic.  He reported drinking more fluid in the last few weeks.  Advised to limit to 64 oz daily.   Thoracic impedance slightly below baseline suggesting fluid accumulation.   Prescribed: Furosemide 40 mg 1 tablet daily  Recommendations: No changes.  Reinforced limiting salt intake to < 2000 mg daily and fluid intake to 64 oz daily.  Encouraged to call for fluid symptoms.  Follow-up plan: ICM clinic phone appointment on 07/05/2018 to recheck fluid levels.      Copy of ICM check sent to Dr. Johney Frame and Dr Eldridge Dace.   3 month ICM trend: 06/23/2018    1 Year ICM trend:       Karie Soda, RN 06/24/2018 12:52 PM

## 2018-07-05 ENCOUNTER — Ambulatory Visit (INDEPENDENT_AMBULATORY_CARE_PROVIDER_SITE_OTHER): Payer: Medicare HMO

## 2018-07-05 DIAGNOSIS — Z95810 Presence of automatic (implantable) cardiac defibrillator: Secondary | ICD-10-CM

## 2018-07-05 DIAGNOSIS — I5022 Chronic systolic (congestive) heart failure: Secondary | ICD-10-CM

## 2018-07-06 DIAGNOSIS — E1165 Type 2 diabetes mellitus with hyperglycemia: Secondary | ICD-10-CM | POA: Diagnosis not present

## 2018-07-06 NOTE — Progress Notes (Signed)
EPIC Encounter for ICM Monitoring  Patient Name: Zachary Berry is a 62 y.o. male Date: 07/06/2018 Primary Care Physican: Tanna Furry, PA-C Primary Cardiologist:Varanasi Electrophysiologist: Allred Dry Weight:171.6lbs      Heart Failure questions reviewed, pt asymptomatic.  He has been cutting back on    Thoracic impedance returned to normal since last remote transmission 06/23/2018.    Prescribed: Furosemide 40 mg 1 tablet daily  Recommendations: No changes.  Encouraged to call for fluid symptoms.  Follow-up plan: ICM clinic phone appointment on 07/21/2018.    Copy of ICM check sent to Dr. Johney Frame.   3 month ICM trend: 07/05/2018    1 Year ICM trend:       Karie Soda, RN 07/06/2018 9:48 AM

## 2018-07-11 DIAGNOSIS — E119 Type 2 diabetes mellitus without complications: Secondary | ICD-10-CM | POA: Diagnosis not present

## 2018-07-11 DIAGNOSIS — H524 Presbyopia: Secondary | ICD-10-CM | POA: Diagnosis not present

## 2018-07-11 DIAGNOSIS — H5203 Hypermetropia, bilateral: Secondary | ICD-10-CM | POA: Diagnosis not present

## 2018-07-11 DIAGNOSIS — H5200 Hypermetropia, unspecified eye: Secondary | ICD-10-CM | POA: Diagnosis not present

## 2018-07-11 DIAGNOSIS — H52209 Unspecified astigmatism, unspecified eye: Secondary | ICD-10-CM | POA: Diagnosis not present

## 2018-07-21 ENCOUNTER — Telehealth: Payer: Self-pay

## 2018-07-21 ENCOUNTER — Ambulatory Visit (INDEPENDENT_AMBULATORY_CARE_PROVIDER_SITE_OTHER): Payer: Medicare HMO

## 2018-07-21 ENCOUNTER — Ambulatory Visit (INDEPENDENT_AMBULATORY_CARE_PROVIDER_SITE_OTHER): Payer: Medicare HMO | Admitting: *Deleted

## 2018-07-21 DIAGNOSIS — Z95810 Presence of automatic (implantable) cardiac defibrillator: Secondary | ICD-10-CM | POA: Diagnosis not present

## 2018-07-21 DIAGNOSIS — I5022 Chronic systolic (congestive) heart failure: Secondary | ICD-10-CM

## 2018-07-21 DIAGNOSIS — I255 Ischemic cardiomyopathy: Secondary | ICD-10-CM

## 2018-07-21 NOTE — Progress Notes (Signed)
EPIC Encounter for ICM Monitoring  Patient Name: Zachary Berry is a 62 y.o. male Date: 07/21/2018 Primary Care Physican: Graylon Gunning PA at [facility] Primary Cardiologist:Varanasi Electrophysiologist: Allred Last Weight: 171.6lbs  Today's Weight: 171 lbs        Heart Failure questions reviewed, pt asymptomatic.   Reviewed types of foods he has been eating and he thought he was following low salt diet.  Patient eats foods such as luncheon meats, ham, lot of cheese, crackers that are salted.  Advised him to review labels to determine how much salt he is currently eating and to decrease to 2000 mg daily.  He has a pre diabetes class he will be attending soon.     Call to office of PCP is Graylon Gunning PA at [facility], [phone] and requested faxed copy of latest labs results.   Thoracic impedance abnormal suggesting fluid accumulation starting 07/12/2018.   Prescribed: Furosemide 40 mg 1 tablet daily  Recommendations: Advised to limit salt intake to 2000 mg daily and encouraged to review food labels for salt amounts.  Encouraged to call for fluid symptoms.  Follow-up plan: ICM clinic phone appointment on 07/28/2018 to recheck fluid levels.  Advised he is due to schedule 6 month f/u office visit for November with Dr Eldridge Dace.     Copy of ICM check sent to Dr. Johney Frame and Dr Eldridge Dace.   3 month ICM trend: 07/21/2018    1 Year ICM trend:       Karie Soda, RN 07/21/2018 2:44 PM

## 2018-07-21 NOTE — Telephone Encounter (Signed)
Call to [facility] in [place] at [phone] and requested a faxed copy of latest labs.  Assistant stated she would fax today.

## 2018-07-21 NOTE — Progress Notes (Signed)
Remote ICD transmission.   

## 2018-07-28 ENCOUNTER — Ambulatory Visit (INDEPENDENT_AMBULATORY_CARE_PROVIDER_SITE_OTHER): Payer: Medicare HMO

## 2018-07-28 DIAGNOSIS — I5022 Chronic systolic (congestive) heart failure: Secondary | ICD-10-CM

## 2018-07-28 DIAGNOSIS — Z95810 Presence of automatic (implantable) cardiac defibrillator: Secondary | ICD-10-CM

## 2018-07-29 ENCOUNTER — Telehealth: Payer: Self-pay

## 2018-07-29 NOTE — Telephone Encounter (Signed)
Remote ICM transmission received.  Attempted call to patient regarding ICM remote transmission and mail box has not been set up. 

## 2018-07-29 NOTE — Progress Notes (Signed)
EPIC Encounter for ICM Monitoring  Patient Name: Zachary Berry is a 62 y.o. male Date: 07/29/2018 Primary Care Physican: Tanna Furry, PA-C Primary Cardiologist:Varanasi Electrophysiologist: Allred Last Weight: 171.6lbs Today's Weight:  unknown       Attempted call to patient and unable to reach.    Transmission reviewed.    Thoracic impedance reutruned normal since last ICM remote transmission on 07/21/2018.  Prescribed: Furosemide 40 mg 1 tablet daily  Recommendations:  Unable to reach.  Follow-up plan: ICM clinic phone appointment on 08/25/2018.     Copy of ICM check sent to Dr. Johney Frame.   3 month ICM trend: 07/28/2018    1 Year ICM trend:       Karie Soda, RN 07/29/2018 3:22 PM

## 2018-08-26 NOTE — Progress Notes (Signed)
No ICM remote transmission received for 08/26/2019 and next ICM transmission scheduled for 09/06/2019.

## 2018-09-05 ENCOUNTER — Ambulatory Visit (INDEPENDENT_AMBULATORY_CARE_PROVIDER_SITE_OTHER): Payer: Medicare HMO

## 2018-09-05 DIAGNOSIS — Z95810 Presence of automatic (implantable) cardiac defibrillator: Secondary | ICD-10-CM

## 2018-09-05 DIAGNOSIS — I5022 Chronic systolic (congestive) heart failure: Secondary | ICD-10-CM | POA: Diagnosis not present

## 2018-09-05 NOTE — Progress Notes (Signed)
EPIC Encounter for ICM Monitoring  Patient Name: Zachary Berry is a 62 y.o. male Date: 09/05/2018 Primary Care Physican: Tanna Furry, PA-C Primary Cardiologist:Varanasi Electrophysiologist: Allred Last Weight:171.6lbs Today's Weight:  unknown                                                   Transmission reviewed.    Thoracic impedance normal.  Prescribed: Furosemide 40 mg 1 tablet daily  Recommendations:  None.  Follow-up plan: ICM clinic phone appointment on 10/06/2018.     Copy of ICM check sent to Dr. Johney Frame.    3 month ICM trend: 09/05/2018    1 Year ICM trend:       Karie Soda, RN 09/05/2018 5:13 PM

## 2018-09-19 LAB — CUP PACEART REMOTE DEVICE CHECK
Battery Voltage: 2.61 V
Brady Statistic AP VS Percent: 12.27 %
Brady Statistic AS VS Percent: 87.7 %
Brady Statistic RA Percent Paced: 12.28 %
Date Time Interrogation Session: 20191114062304
HighPow Impedance: 399 Ohm
HighPow Impedance: 47 Ohm
HighPow Impedance: 58 Ohm
Implantable Lead Implant Date: 20111111
Implantable Lead Location: 753860
Implantable Lead Model: 5076
Implantable Lead Model: 6947
Implantable Pulse Generator Implant Date: 20111111
Lead Channel Impedance Value: 456 Ohm
Lead Channel Pacing Threshold Pulse Width: 0.4 ms
Lead Channel Sensing Intrinsic Amplitude: 2.875 mV
Lead Channel Sensing Intrinsic Amplitude: 2.875 mV
Lead Channel Setting Sensing Sensitivity: 0.3 mV
MDC IDC LEAD IMPLANT DT: 20111111
MDC IDC LEAD LOCATION: 753859
MDC IDC MSMT LEADCHNL RA IMPEDANCE VALUE: 494 Ohm
MDC IDC MSMT LEADCHNL RA PACING THRESHOLD AMPLITUDE: 0.75 V
MDC IDC MSMT LEADCHNL RV PACING THRESHOLD AMPLITUDE: 0.625 V
MDC IDC MSMT LEADCHNL RV PACING THRESHOLD PULSEWIDTH: 0.4 ms
MDC IDC MSMT LEADCHNL RV SENSING INTR AMPL: 13.75 mV
MDC IDC MSMT LEADCHNL RV SENSING INTR AMPL: 13.75 mV
MDC IDC SET LEADCHNL RA PACING AMPLITUDE: 2 V
MDC IDC SET LEADCHNL RV PACING AMPLITUDE: 2.5 V
MDC IDC SET LEADCHNL RV PACING PULSEWIDTH: 0.4 ms
MDC IDC STAT BRADY AP VP PERCENT: 0.02 %
MDC IDC STAT BRADY AS VP PERCENT: 0.02 %
MDC IDC STAT BRADY RV PERCENT PACED: 0.04 %

## 2018-10-06 ENCOUNTER — Ambulatory Visit (INDEPENDENT_AMBULATORY_CARE_PROVIDER_SITE_OTHER): Payer: Medicare HMO

## 2018-10-06 DIAGNOSIS — I5022 Chronic systolic (congestive) heart failure: Secondary | ICD-10-CM

## 2018-10-06 DIAGNOSIS — Z95810 Presence of automatic (implantable) cardiac defibrillator: Secondary | ICD-10-CM

## 2018-10-07 ENCOUNTER — Telehealth: Payer: Self-pay

## 2018-10-07 NOTE — Telephone Encounter (Signed)
Remote ICM transmission received.  Attempted call to patient regarding ICM remote transmission and voice mail not set up.

## 2018-10-07 NOTE — Progress Notes (Signed)
[facility] Encounter for ICM Monitoring  Patient Name: Zachary Berry is a 63 y.o. male Date: 10/07/2018 Primary Care Physican: Tanna Furry, PA-C Primary Cardiologist:Varanasi Electrophysiologist: Allred LastWeight:171.6lbs Today's Weight: unknown   Attempted call to patient and unable to reach.   Transmission reviewed.   Thoracic impedance normal.  Prescribed:Furosemide 40 mg 1 tablet daily  Recommendations:Unable to reach.  Follow-up plan: ICM clinic phone appointment on3/11/2018.   Copy of ICM check sent to Dr.Allred.    3 month ICM trend: 10/06/2018    1 Year ICM trend:       Karie Soda, RN 10/07/2018 8:07 AM

## 2018-10-20 ENCOUNTER — Ambulatory Visit (INDEPENDENT_AMBULATORY_CARE_PROVIDER_SITE_OTHER): Payer: Medicare HMO

## 2018-10-20 DIAGNOSIS — I255 Ischemic cardiomyopathy: Secondary | ICD-10-CM | POA: Diagnosis not present

## 2018-10-21 LAB — CUP PACEART REMOTE DEVICE CHECK
Battery Voltage: 2.61 V
Brady Statistic AP VP Percent: 0.01 %
Brady Statistic AS VS Percent: 97.35 %
Brady Statistic RA Percent Paced: 2.62 %
Brady Statistic RV Percent Paced: 0.04 %
HIGH POWER IMPEDANCE MEASURED VALUE: 399 Ohm
HIGH POWER IMPEDANCE MEASURED VALUE: 49 Ohm
HighPow Impedance: 61 Ohm
Implantable Lead Implant Date: 20111111
Implantable Lead Implant Date: 20111111
Implantable Lead Location: 753860
Implantable Lead Model: 6947
Implantable Pulse Generator Implant Date: 20111111
Lead Channel Impedance Value: 456 Ohm
Lead Channel Pacing Threshold Amplitude: 0.625 V
Lead Channel Pacing Threshold Amplitude: 0.75 V
Lead Channel Pacing Threshold Pulse Width: 0.4 ms
Lead Channel Sensing Intrinsic Amplitude: 14.75 mV
Lead Channel Sensing Intrinsic Amplitude: 14.75 mV
Lead Channel Sensing Intrinsic Amplitude: 2.875 mV
Lead Channel Setting Pacing Amplitude: 2 V
Lead Channel Setting Pacing Amplitude: 2.5 V
Lead Channel Setting Pacing Pulse Width: 0.4 ms
Lead Channel Setting Sensing Sensitivity: 0.3 mV
MDC IDC LEAD LOCATION: 753859
MDC IDC MSMT LEADCHNL RA IMPEDANCE VALUE: 494 Ohm
MDC IDC MSMT LEADCHNL RA SENSING INTR AMPL: 2.875 mV
MDC IDC MSMT LEADCHNL RV PACING THRESHOLD PULSEWIDTH: 0.4 ms
MDC IDC SESS DTM: 20200213093825
MDC IDC STAT BRADY AP VS PERCENT: 2.61 %
MDC IDC STAT BRADY AS VP PERCENT: 0.03 %

## 2018-11-01 NOTE — Progress Notes (Signed)
Remote ICD transmission.   

## 2018-11-08 ENCOUNTER — Ambulatory Visit (INDEPENDENT_AMBULATORY_CARE_PROVIDER_SITE_OTHER): Payer: Medicare HMO

## 2018-11-08 DIAGNOSIS — F329 Major depressive disorder, single episode, unspecified: Secondary | ICD-10-CM | POA: Diagnosis not present

## 2018-11-08 DIAGNOSIS — E119 Type 2 diabetes mellitus without complications: Secondary | ICD-10-CM | POA: Diagnosis not present

## 2018-11-08 DIAGNOSIS — I5022 Chronic systolic (congestive) heart failure: Secondary | ICD-10-CM | POA: Diagnosis not present

## 2018-11-08 DIAGNOSIS — E785 Hyperlipidemia, unspecified: Secondary | ICD-10-CM | POA: Diagnosis not present

## 2018-11-08 DIAGNOSIS — Z95810 Presence of automatic (implantable) cardiac defibrillator: Secondary | ICD-10-CM | POA: Diagnosis not present

## 2018-11-08 DIAGNOSIS — I10 Essential (primary) hypertension: Secondary | ICD-10-CM | POA: Diagnosis not present

## 2018-11-08 DIAGNOSIS — M25512 Pain in left shoulder: Secondary | ICD-10-CM | POA: Diagnosis not present

## 2018-11-09 ENCOUNTER — Telehealth: Payer: Self-pay

## 2018-11-09 NOTE — Progress Notes (Signed)
EPIC Encounter for ICM Monitoring  Patient Name: Zachary Berry is a 63 y.o. male Date: 11/09/2018 Primary Care Physican: Tanna Furry, PA-C Primary Cardiologist:Varanasi Electrophysiologist: Allred LastWeight:171.6lbs Today's Weight: unknown   Attempted call to patient and unable to reach.   Transmission reviewed.   Thoracic impedance abnormal suggesting fluid accumulation since 10/31/2018 and almost back at baseline.  Prescribed:Furosemide 40 mg 1 tablet daily  Recommendations:Unable to reach.  Follow-up plan: ICM clinic phone appointment on4/02/2019.   Copy of ICM check sent to Dr.Allred  3 month ICM trend: 11/08/2018    1 Year ICM trend:       Karie Soda, RN 11/09/2018 12:20 PM

## 2018-11-09 NOTE — Telephone Encounter (Signed)
Remote ICM transmission received.  Attempted call to patient regarding ICM remote transmission and voice mail box has not been set up. °

## 2018-11-11 DIAGNOSIS — M7542 Impingement syndrome of left shoulder: Secondary | ICD-10-CM | POA: Diagnosis not present

## 2018-11-11 NOTE — Progress Notes (Signed)
Returned call to patient as requested by [employer]. Transmission reviewed. Pt asymptomatic and is doing well.  He reported eating [place] country ham biscuits several days.  Advised the salt is between 1540 and 1750 for one biscuit and his limit is 2000 mg daily.  Advised to find alternative to the country ham.  He said his A1C was 9 and he has to work on getting his blood sugars under better control.  Advised to call for any fluid accumulation symptoms.  Weight stable at 172 lbs.  Next remote scheduled for 12/12/2018.

## 2018-11-14 DIAGNOSIS — E1165 Type 2 diabetes mellitus with hyperglycemia: Secondary | ICD-10-CM | POA: Diagnosis not present

## 2018-11-16 ENCOUNTER — Telehealth: Payer: Self-pay

## 2018-11-16 NOTE — Telephone Encounter (Signed)
Device likely at RRT. Appt made for tomorrow at 1PM to check device and schedule gen change (needs echo prior to gen change per Dr Jenel Lucks last note).  He will call back if he can't get a ride to tomorrow's appointment.  Gypsy Balsam, NP 11/16/2018 11:26 AM

## 2018-11-16 NOTE — Telephone Encounter (Signed)
Pt called stating that his ICD is alarming. I attempted to help him send in a manual transmission but he received the error code 3248. I gave him the number to [facility] tech support to get further help with his transmission. I told him once I receive his transmission I will give him a call back.

## 2018-11-17 ENCOUNTER — Ambulatory Visit (INDEPENDENT_AMBULATORY_CARE_PROVIDER_SITE_OTHER): Payer: Medicare HMO | Admitting: Nurse Practitioner

## 2018-11-17 ENCOUNTER — Encounter: Payer: Self-pay | Admitting: Nurse Practitioner

## 2018-11-17 ENCOUNTER — Other Ambulatory Visit: Payer: Self-pay

## 2018-11-17 DIAGNOSIS — I255 Ischemic cardiomyopathy: Secondary | ICD-10-CM | POA: Diagnosis not present

## 2018-11-17 DIAGNOSIS — I25118 Atherosclerotic heart disease of native coronary artery with other forms of angina pectoris: Secondary | ICD-10-CM

## 2018-11-17 DIAGNOSIS — I10 Essential (primary) hypertension: Secondary | ICD-10-CM

## 2018-11-17 LAB — CUP PACEART INCLINIC DEVICE CHECK
Battery Voltage: 2.61 V
Brady Statistic AP VP Percent: 0.01 %
Brady Statistic AS VP Percent: 0.03 %
Brady Statistic RA Percent Paced: 7.43 %
Brady Statistic RV Percent Paced: 0.04 %
Date Time Interrogation Session: 20200312143410
HIGH POWER IMPEDANCE MEASURED VALUE: 399 Ohm
HIGH POWER IMPEDANCE MEASURED VALUE: 54 Ohm
HighPow Impedance: 69 Ohm
Implantable Lead Implant Date: 20111111
Implantable Lead Location: 753860
Implantable Lead Model: 5076
Implantable Lead Model: 6947
Implantable Pulse Generator Implant Date: 20111111
Lead Channel Impedance Value: 513 Ohm
Lead Channel Pacing Threshold Amplitude: 0.625 V
Lead Channel Sensing Intrinsic Amplitude: 15.5 mV
Lead Channel Sensing Intrinsic Amplitude: 15.875 mV
Lead Channel Sensing Intrinsic Amplitude: 3.75 mV
Lead Channel Setting Pacing Pulse Width: 0.4 ms
Lead Channel Setting Sensing Sensitivity: 0.3 mV
MDC IDC LEAD IMPLANT DT: 20111111
MDC IDC LEAD LOCATION: 753859
MDC IDC MSMT LEADCHNL RA IMPEDANCE VALUE: 532 Ohm
MDC IDC MSMT LEADCHNL RA PACING THRESHOLD AMPLITUDE: 0.75 V
MDC IDC MSMT LEADCHNL RA PACING THRESHOLD PULSEWIDTH: 0.4 ms
MDC IDC MSMT LEADCHNL RA SENSING INTR AMPL: 3.5 mV
MDC IDC MSMT LEADCHNL RV PACING THRESHOLD PULSEWIDTH: 0.4 ms
MDC IDC SET LEADCHNL RA PACING AMPLITUDE: 2 V
MDC IDC SET LEADCHNL RV PACING AMPLITUDE: 2.5 V
MDC IDC STAT BRADY AP VS PERCENT: 7.42 %
MDC IDC STAT BRADY AS VS PERCENT: 92.54 %

## 2018-11-17 NOTE — Patient Instructions (Signed)
Your physician has requested that you have an echocardiogram. Echocardiography is a painless test that uses sound waves to create images of your heart. It provides your doctor with information about the size and shape of your heart and how well your heart's chambers and valves are working. This procedure takes approximately one hour. There are no restrictions for this procedure.  Your physician recommends that you schedule a follow-up appointment in: 10 days after gen change 12/06/18.

## 2018-11-17 NOTE — Progress Notes (Signed)
Electrophysiology Office Note Date: 11/17/2018  ID:  Zachary Berry, DOB [birth year], MRN [id number]  PCP: System, Pcp Not In Primary Cardiologist: Eldridge Dace Electrophysiologist: Allred  CC: Routine ICD follow-up  Zachary Berry is a 63 y.o. male seen today for Dr Johney Frame.  He presents today for evaluation of ICD beeping. Since last being seen in our clinic, the patient reports doing very well. He denies chest pain, palpitations, dyspnea, PND, orthopnea, nausea, vomiting, dizziness, syncope, edema, weight gain, or early satiety.  He has not had ICD shocks.   Device History: MDT dual chamber ICD implanted 2011 for cardiomyopathy History of appropriate therapy: No History of AAD therapy: No   Past Medical History:  Diagnosis Date  . CAD (coronary artery disease)    a. s/p Ant STEMI >> LHC (04/03/2009): EF 30%, proximal LAD occluded >>> PCI: Xience DES to prox LAD.;  b.  LHC (9/15): Proximal LAD stent patent  . Chronic systolic heart failure (HCC)   . Depression   . Diabetes mellitus   . HTN (hypertension)   . Hyperlipidemia   . Ischemic cardiomyopathy    a.  Echo (12/2012): EF 30-35%, anteroseptal hypokinesis, mild LAE;   b.  Cardiac MRI (06/2010): Full thickness scar of the dist ant wall, septum, apex with 2/3 thickness scar of mid and basal ant wall, EF 34%.  . Learning disability   . Poor vision   . S/P implantation of automatic cardioverter/defibrillator (AICD)    Past Surgical History:  Procedure Laterality Date  . HAND SURGERY    . ICD Implant  07/18/10   MDT ICD implanted by Dr Johney Frame  . LEFT HEART CATHETERIZATION WITH CORONARY ANGIOGRAM N/A 05/25/2014   Procedure: LEFT HEART CATHETERIZATION WITH CORONARY ANGIOGRAM;  Surgeon: Thurmon Fair, MD;  Location: [facility] CATH [facility];  Service: Cardiovascular;  Laterality: N/A;  . PCI LAD  7/10    Current Outpatient Medications  Medication Sig Dispense Refill  . aspirin EC 81 MG tablet Take 81 mg by mouth daily.    .  carvedilol (COREG) 25 MG tablet Take 25 mg by mouth 2 (two) times daily with a meal.    . clopidogrel (PLAVIX) 75 MG tablet Take 1 tablet (75 mg total) by mouth daily. 90 tablet 1  . enalapril (VASOTEC) 20 MG tablet Take 1 tablet (20 mg total) by mouth 2 (two) times daily. 60 tablet 0  . fenofibrate (TRICOR) 145 MG tablet Take 145 mg by mouth daily.    . furosemide (LASIX) 40 MG tablet TAKE 1 TABLET BY MOUTH ONCE DAILY. 90 tablet 1  . glipiZIDE (GLUCOTROL) 10 MG tablet Take 10 mg by mouth 2 (two) times daily before a meal.      . insulin glargine (LANTUS) 100 UNIT/ML injection Inject 45 Units into the skin at bedtime as needed (for blood sugar).     . metFORMIN (GLUCOPHAGE) 1000 MG tablet Take 1 tablet by mouth 2 (two) times daily.    . nitroGLYCERIN (NITROSTAT) 0.4 MG SL tablet Place 1 tablet (0.4 mg total) under the tongue every 5 (five) minutes as needed for chest pain. 25 tablet 3  . rosuvastatin (CRESTOR) 40 MG tablet Take 1 tablet (40 mg total) by mouth daily. 30 tablet 6  . sertraline (ZOLOFT) 100 MG tablet Take 100 mg by mouth 2 (two) times daily.     Marland Kitchen spironolactone (ALDACTONE) 25 MG tablet Take 1 tablet (25 mg total) by mouth daily. 30 tablet 6   No current facility-administered medications  for this visit.     Allergies:   Patient has no known allergies.   Social History: Social History   Socioeconomic History  . Marital status: Married    Spouse name: Not on file  . Number of children: Not on file  . Years of education: Not on file  . Highest education level: Not on file  Occupational History  . Occupation: Disabled    Comment: due to CAD  Social Needs  . Financial resource strain: Not on file  . Food insecurity:    Worry: Not on file    Inability: Not on file  . Transportation needs:    Medical: Not on file    Non-medical: Not on file  Tobacco Use  . Smoking status: Never Smoker  . Smokeless tobacco: Never Used  . Tobacco comment: former passive smoker   Substance and Sexual Activity  . Alcohol use: No  . Drug use: No  . Sexual activity: Not on file  Lifestyle  . Physical activity:    Days per week: Not on file    Minutes per session: Not on file  . Stress: Not on file  Relationships  . Social connections:    Talks on phone: Not on file    Gets together: Not on file    Attends religious service: Not on file    Active member of club or organization: Not on file    Attends meetings of clubs or organizations: Not on file    Relationship status: Not on file  . Intimate partner violence:    Fear of current or ex partner: Not on file    Emotionally abused: Not on file    Physically abused: Not on file    Forced sexual activity: Not on file  Other Topics Concern  . Not on file  Social History Narrative  . Not on file    Family History: Family History  Problem Relation Age of Onset  . Heart disease Mother   . Heart failure Mother   . Diabetes Mother   . Stroke Mother   . Diabetes Father   . Hypertension Father   . Heart attack Brother 45  . Stomach cancer Unknown        uncle  . Diabetes Sister   . Diabetes Brother   . Sudden death Brother     Review of Systems: All other systems reviewed and are otherwise negative except as noted above.   Physical Exam:  GEN- The patient is well appearing, alert and oriented x 3 today.   HEENT: normocephalic, atraumatic; sclera clear, conjunctiva pink; hearing intact; oropharynx clear; neck supple  Lungs- Clear to ausculation bilaterally, normal work of breathing.  No wheezes, rales, rhonchi Heart- Regular rate and rhythm  GI- soft, non-tender, non-distended, bowel sounds present  Extremities- no clubbing, cyanosis, or edema  MS- no significant deformity or atrophy Skin- warm and dry, no rash or lesion; ICD pocket well healed Psych- euthymic mood, full affect Neuro- strength and sensation are intact  ICD interrogation- reviewed in detail today,  See PACEART report   Recent  Labs: No results found for requested labs within last 8760 hours.   Wt Readings from Last 3 Encounters:  01/18/18 180 lb (81.6 kg)  10/18/17 181 lb (82.1 kg)  08/13/16 176 lb (79.8 kg)     Other studies Reviewed: Additional studies/ records that were reviewed today include: Dr Johney Frame and Dr Hoyle Barr office notes   Assessment and Plan:  1.  Chronic  systolic dysfunction euvolemic today Stable on an appropriate medical regimen ICD at [facility]. Risks, benefits to gen change reviewed with patient today who wishes to proceed. Will schedule at the next available time.  Will update echo prior to gen change as per Dr Jenel Lucks last note.  2.  CAD  No recent ischemic symptoms Continue medical therapy   3.  HTN Stable No change required today    Current medicines are reviewed at length with the patient today.   The patient does not have concerns regarding his medicines.  The following changes were made today:  none  Labs/ tests ordered today include: pre-procedure labs, echo No orders of the defined types were placed in this encounter.    Disposition:   Follow up with Dr Johney Frame after gen change      Signed, Gypsy Balsam, NP 11/17/2018 1:24 PM  [facility] [address] [place] [address] [phone] (office) [phone] (fax)

## 2018-11-22 ENCOUNTER — Telehealth: Payer: Self-pay

## 2018-11-22 NOTE — Telephone Encounter (Signed)
Pt notified of rescheduling of gen change to January 05, 2019 at 2:00 pm.

## 2018-11-28 ENCOUNTER — Telehealth: Payer: Self-pay | Admitting: Cardiology

## 2018-11-28 NOTE — Telephone Encounter (Signed)
   Reason for call: Reschedule echocardiogram.  In light of coronavirus, his GEN change that has been postponed till April 30.  We will also postpone his echocardiogram until a few days prior to April 30.   Patient is feeling well no symptoms.  No fevers chest pain syncope.  He knows to contact us if he has any issues.  Appreciative of call.  Donato Schultz, MD

## 2018-11-29 ENCOUNTER — Telehealth: Payer: Self-pay | Admitting: *Deleted

## 2018-11-29 ENCOUNTER — Ambulatory Visit (HOSPITAL_COMMUNITY): Payer: Medicare HMO

## 2018-11-29 NOTE — Telephone Encounter (Signed)
ERROR

## 2018-11-30 ENCOUNTER — Other Ambulatory Visit: Payer: Self-pay | Admitting: *Deleted

## 2018-11-30 DIAGNOSIS — Z95810 Presence of automatic (implantable) cardiac defibrillator: Secondary | ICD-10-CM

## 2018-11-30 DIAGNOSIS — I255 Ischemic cardiomyopathy: Secondary | ICD-10-CM

## 2018-11-30 DIAGNOSIS — I25118 Atherosclerotic heart disease of native coronary artery with other forms of angina pectoris: Secondary | ICD-10-CM

## 2018-11-30 NOTE — Progress Notes (Signed)
New order for echo requested by echo scheduler for this pt. This is ordered by Dr Johney Frame for gen change.

## 2018-12-12 ENCOUNTER — Other Ambulatory Visit: Payer: Self-pay

## 2018-12-12 ENCOUNTER — Ambulatory Visit (INDEPENDENT_AMBULATORY_CARE_PROVIDER_SITE_OTHER): Payer: Medicare HMO

## 2018-12-12 DIAGNOSIS — I5022 Chronic systolic (congestive) heart failure: Secondary | ICD-10-CM

## 2018-12-12 DIAGNOSIS — Z95810 Presence of automatic (implantable) cardiac defibrillator: Secondary | ICD-10-CM

## 2018-12-13 NOTE — Progress Notes (Signed)
EPIC Encounter for ICM Monitoring  Patient Name: Zachary Berry is a 63 y.o. male Date: 12/13/2018 Primary Care Physican: System, Pcp Not In Primary Cardiologist:[place] Electrophysiologist: Allred LastWeight:172lbs Today's Weight: 171 lbs   Heart failure questions reviewed. He is feeling fine at this time.    Device battery replacement scheduled for 01/05/2019. Explained after the new battery is in place it takes about 6 weeks for new fluid level baseline to develop and will check schedule the next remote for 6 weeks after battery replacement date.  Thoracic impedance slightly abnormal suggesting fluid accumulation  Prescribed:Furosemide 40 mg 1 tablet daily  Recommendations:No changes and encouraged to call if he has any fluid symptoms.   Follow-up plan: ICM clinic phone appointment on6/15/2020.   Copy of ICM check sent to Dr.Allred  3 month ICM trend: 12/12/2018    1 Year ICM trend:       Karie Soda, RN 12/13/2018 3:53 PM

## 2018-12-14 NOTE — Telephone Encounter (Signed)
Printed

## 2018-12-19 ENCOUNTER — Ambulatory Visit: Payer: Medicare HMO

## 2018-12-28 ENCOUNTER — Telehealth: Payer: Self-pay | Admitting: Cardiology

## 2018-12-28 NOTE — Telephone Encounter (Signed)
   Primary Cardiologist: Dr. Hillis Range  History reviewed and case disucssed with Dr. Johney Frame.  His nurse will call patient to postpone generator changeout and echo and reschedule both.  Based on discussion, with current pandemic situation, we will be postponing this appointment for Zachary Berry with a plan for f/u prior to pacer generator changeout or sooner if feasible/necessary.   Routing to C19 CANCEL pool for tracking (P CV DIV CV19 CANCEL - reason for visit "other.") and assigning priority (1 = 4-6 wks, 2 = 6-12 wks, 3 = >12 wks).   Armanda Magic, MD  12/28/2018 4:45 PM         .

## 2018-12-30 ENCOUNTER — Telehealth: Payer: Self-pay

## 2018-12-30 ENCOUNTER — Ambulatory Visit (HOSPITAL_COMMUNITY): Payer: Medicare HMO | Attending: Cardiology

## 2018-12-30 ENCOUNTER — Other Ambulatory Visit: Payer: Self-pay

## 2018-12-30 DIAGNOSIS — I255 Ischemic cardiomyopathy: Secondary | ICD-10-CM | POA: Diagnosis not present

## 2018-12-30 MED ORDER — PERFLUTREN LIPID MICROSPHERE
1.0000 mL | INTRAVENOUS | Status: AC | PRN
  Administered 2018-12-30: 2 mL via INTRAVENOUS

## 2018-12-30 NOTE — Telephone Encounter (Signed)
Call placed to Pt.  Advised per Dr. Lucilla Edin gen change would be cancelled at this time.  Will await results of echo.

## 2019-01-05 ENCOUNTER — Encounter (HOSPITAL_COMMUNITY): Admission: RE | Payer: Self-pay | Source: Home / Self Care

## 2019-01-05 ENCOUNTER — Ambulatory Visit (HOSPITAL_COMMUNITY): Admission: RE | Admit: 2019-01-05 | Payer: Medicare HMO | Source: Home / Self Care | Admitting: Internal Medicine

## 2019-01-05 SURGERY — ICD GENERATOR CHANGEOUT

## 2019-01-06 ENCOUNTER — Telehealth: Payer: Self-pay

## 2019-01-06 NOTE — Telephone Encounter (Signed)
Call placed to Pt.  Gave Pt results of ECHO.  Advised Dr. Johney Frame would like to get him scheduled for his gen change.  Pt agreeable.  Please arrive to admitting of [facility] at:  11:30 am on May 6 Pt is diabetic.  Will have oatmeal prior to 7 am.  NPO after 7 am Do not take any medications the morning of the procedure- HOLD PLAVIX STARTING TODAY FOR 5 DAYS Pt will be discharged after procedure You will need someone to drive you home at discharge  Pt will need labs/EKG upon arrival for procedure.  Pt has soap.  Discussed use.

## 2019-01-11 ENCOUNTER — Other Ambulatory Visit: Payer: Self-pay

## 2019-01-11 ENCOUNTER — Encounter (HOSPITAL_COMMUNITY)
Admission: RE | Disposition: A | Discharge status: Home / Self Care | Payer: Self-pay | Source: Home / Self Care | Attending: Internal Medicine

## 2019-01-11 ENCOUNTER — Encounter (HOSPITAL_COMMUNITY): Payer: Self-pay | Admitting: *Deleted

## 2019-01-11 ENCOUNTER — Ambulatory Visit (HOSPITAL_COMMUNITY)
Admission: RE | Admit: 2019-01-11 | Discharge: 2019-01-11 | Disposition: A | Discharge status: Home / Self Care | Payer: Medicare HMO | Attending: Internal Medicine | Admitting: Internal Medicine

## 2019-01-11 DIAGNOSIS — Z8249 Family history of ischemic heart disease and other diseases of the circulatory system: Secondary | ICD-10-CM | POA: Diagnosis not present

## 2019-01-11 DIAGNOSIS — I110 Hypertensive heart disease with heart failure: Secondary | ICD-10-CM | POA: Insufficient documentation

## 2019-01-11 DIAGNOSIS — Z823 Family history of stroke: Secondary | ICD-10-CM | POA: Diagnosis not present

## 2019-01-11 DIAGNOSIS — Z45010 Encounter for checking and testing of cardiac pacemaker pulse generator [battery]: Secondary | ICD-10-CM | POA: Diagnosis not present

## 2019-01-11 DIAGNOSIS — Z7902 Long term (current) use of antithrombotics/antiplatelets: Secondary | ICD-10-CM | POA: Insufficient documentation

## 2019-01-11 DIAGNOSIS — Z794 Long term (current) use of insulin: Secondary | ICD-10-CM | POA: Diagnosis not present

## 2019-01-11 DIAGNOSIS — I255 Ischemic cardiomyopathy: Secondary | ICD-10-CM | POA: Insufficient documentation

## 2019-01-11 DIAGNOSIS — Z833 Family history of diabetes mellitus: Secondary | ICD-10-CM | POA: Insufficient documentation

## 2019-01-11 DIAGNOSIS — E119 Type 2 diabetes mellitus without complications: Secondary | ICD-10-CM | POA: Diagnosis not present

## 2019-01-11 DIAGNOSIS — Z79899 Other long term (current) drug therapy: Secondary | ICD-10-CM | POA: Insufficient documentation

## 2019-01-11 DIAGNOSIS — E785 Hyperlipidemia, unspecified: Secondary | ICD-10-CM | POA: Diagnosis not present

## 2019-01-11 DIAGNOSIS — I2510 Atherosclerotic heart disease of native coronary artery without angina pectoris: Secondary | ICD-10-CM | POA: Diagnosis not present

## 2019-01-11 DIAGNOSIS — I5022 Chronic systolic (congestive) heart failure: Secondary | ICD-10-CM | POA: Diagnosis not present

## 2019-01-11 DIAGNOSIS — Z7982 Long term (current) use of aspirin: Secondary | ICD-10-CM | POA: Diagnosis not present

## 2019-01-11 DIAGNOSIS — Z006 Encounter for examination for normal comparison and control in clinical research program: Secondary | ICD-10-CM | POA: Insufficient documentation

## 2019-01-11 DIAGNOSIS — I5020 Unspecified systolic (congestive) heart failure: Secondary | ICD-10-CM | POA: Diagnosis not present

## 2019-01-11 HISTORY — PX: ICD GENERATOR CHANGEOUT: EP1231

## 2019-01-11 LAB — CBC
HCT: 46.1 % (ref 39.0–52.0)
Hemoglobin: 15.8 g/dL (ref 13.0–17.0)
MCH: 30.7 pg (ref 26.0–34.0)
MCHC: 34.3 g/dL (ref 30.0–36.0)
MCV: 89.5 fL (ref 80.0–100.0)
Platelets: 337 10*3/uL (ref 150–400)
RBC: 5.15 MIL/uL (ref 4.22–5.81)
RDW: 12.9 % (ref 11.5–15.5)
WBC: 6.6 10*3/uL (ref 4.0–10.5)
nRBC: 0 % (ref 0.0–0.2)

## 2019-01-11 LAB — BASIC METABOLIC PANEL
Anion gap: 14 (ref 5–15)
BUN: 23 mg/dL (ref 8–23)
CO2: 21 mmol/L — ABNORMAL LOW (ref 22–32)
Calcium: 10 mg/dL (ref 8.9–10.3)
Chloride: 99 mmol/L (ref 98–111)
Creatinine, Ser: 1.16 mg/dL (ref 0.61–1.24)
GFR calc Af Amer: >60 mL/min (ref >60)
GFR calc non Af Amer: >60 mL/min (ref >60)
Glucose, Bld: 316 mg/dL — ABNORMAL HIGH (ref 70–99)
Potassium: 3.9 mmol/L (ref 3.5–5.1)
Sodium: 134 mmol/L — ABNORMAL LOW (ref 135–145)

## 2019-01-11 LAB — SURGICAL PCR SCREEN
MRSA, PCR: NEGATIVE
Staphylococcus aureus: NEGATIVE

## 2019-01-11 LAB — GLUCOSE, CAPILLARY: Glucose-Capillary: 313 mg/dL — ABNORMAL HIGH (ref 70–99)

## 2019-01-11 SURGERY — ICD GENERATOR CHANGEOUT

## 2019-01-11 MED ORDER — CEFAZOLIN SODIUM-DEXTROSE 2-4 GM/100ML-% IV SOLN
2.0000 g | INTRAVENOUS | Status: AC
  Administered 2019-01-11: 2 g via INTRAVENOUS

## 2019-01-11 MED ORDER — SODIUM CHLORIDE 0.9% FLUSH: 3.0000 mL | Freq: Two times a day (BID) | INTRAVENOUS | Status: DC

## 2019-01-11 MED ORDER — SODIUM CHLORIDE 0.9 % IV SOLN: 250.0000 mL | INTRAVENOUS | Status: DC | PRN

## 2019-01-11 MED ORDER — MUPIROCIN 2 % EX OINT
1.0000 "application " | TOPICAL_OINTMENT | Freq: Once | CUTANEOUS | Status: AC
  Administered 2019-01-11: 1 via TOPICAL
  Filled 2019-01-11: qty 22

## 2019-01-11 MED ORDER — SODIUM CHLORIDE 0.9% FLUSH: 3.0000 mL | INTRAVENOUS | Status: DC | PRN

## 2019-01-11 MED ORDER — LIDOCAINE HCL (PF) 1 % IJ SOLN
INTRAMUSCULAR | Status: DC | PRN
  Administered 2019-01-11: 60 mL

## 2019-01-11 MED ORDER — SODIUM CHLORIDE 0.9 % IV SOLN
INTRAVENOUS | Status: DC
  Administered 2019-01-11: 12:00:00 via INTRAVENOUS

## 2019-01-11 MED ORDER — SODIUM CHLORIDE 0.9 % IV SOLN
INTRAVENOUS | Status: AC
  Filled 2019-01-11: qty 2

## 2019-01-11 MED ORDER — LIDOCAINE HCL (PF) 1 % IJ SOLN
INTRAMUSCULAR | Status: AC
  Filled 2019-01-11: qty 30

## 2019-01-11 MED ORDER — ACETAMINOPHEN 325 MG PO TABS: 325.0000 mg | ORAL_TABLET | ORAL | Status: DC | PRN

## 2019-01-11 MED ORDER — CEFAZOLIN SODIUM-DEXTROSE 2-4 GM/100ML-% IV SOLN
INTRAVENOUS | Status: AC
  Filled 2019-01-11: qty 100

## 2019-01-11 MED ORDER — ONDANSETRON HCL 4 MG/2ML IJ SOLN: 4.0000 mg | Freq: Four times a day (QID) | INTRAMUSCULAR | Status: DC | PRN

## 2019-01-11 MED ORDER — CHLORHEXIDINE GLUCONATE 4 % EX LIQD
60.0000 mL | Freq: Once | CUTANEOUS | Status: DC
  Filled 2019-01-11: qty 60

## 2019-01-11 MED ORDER — SODIUM CHLORIDE 0.9 % IV SOLN
80.0000 mg | Freq: Once | INTRAVENOUS | Status: AC
  Administered 2019-01-11: 80 mg

## 2019-01-11 MED ORDER — MUPIROCIN 2 % EX OINT
TOPICAL_OINTMENT | CUTANEOUS | Status: AC
  Administered 2019-01-11: 1 via TOPICAL
  Filled 2019-01-11: qty 22

## 2019-01-11 SURGICAL SUPPLY — 5 items
CABLE SURGICAL S-101-97-12 (CABLE) ×3 IMPLANT
ICD EVERA XT MRI DF1  DDMB1D1 (ICD Generator) ×2 IMPLANT
ICD EVERA XT MRI DF1 DDMB1D1 (ICD Generator) IMPLANT
PAD PRO RADIOLUCENT 2001M-C (PAD) ×3 IMPLANT
TRAY PACEMAKER INSERTION (PACKS) ×3 IMPLANT

## 2019-01-11 NOTE — Progress Notes (Signed)
Renee, PA called about CBG results states she will notify the Dr.

## 2019-01-11 NOTE — Discharge Instructions (Signed)
Post procedure care instructions °Keep incision clean and dry for 10 days. °No driving for 2 days.  °You can remove outer dressing tomorrow. °Leave steri-strips (little pieces of tape) on until seen in the office for wound check appointment. °Call the office ([phone]) for redness, drainage, swelling, or fever. ° ° °Pacemaker Battery Change, Care After °This sheet gives you information about how to care for yourself after your procedure. Your health care provider may also give you more specific instructions. If you have problems or questions, contact your health care provider. °What can I expect after the procedure? °After your procedure, it is common to have: °· Pain or soreness at the site where the pacemaker was inserted. °· Swelling at the site where the pacemaker was inserted. °Follow these instructions at home: °Incision care °· Keep the incision clean and dry. °? Do not take baths, swim, or use a hot tub until your health care provider approves. °? You may shower the day after your procedure, or as directed by your health care provider. °? Pat the area dry with a clean towel. Do not rub the area. This may cause bleeding. °· Follow instructions from your health care provider about how to take care of your incision. Make sure you: °? Wash your hands with soap and water before you change your bandage (dressing). If soap and water are not available, use hand sanitizer. °? Change your dressing as told by your health care provider. °? Leave stitches (sutures), skin glue, or adhesive strips in place. These skin closures may need to stay in place for 2 weeks or longer. If adhesive strip edges start to loosen and curl up, you may trim the loose edges. Do not remove adhesive strips completely unless your health care provider tells you to do that. °· Check your incision area every day for signs of infection. Check for: °? More redness, swelling, or pain. °? More fluid or blood. °? Warmth. °? Pus or a bad  smell. °Activity °· Do not lift anything that is heavier than 10 lb (4.5 kg) until your health care provider says it is okay to do so. °· For the first 2 weeks, or as long as told by your health care provider: °? Avoid lifting your left arm higher than your shoulder. °? Be gentle when you move your arms over your head. It is okay to raise your arm to comb your hair. °? Avoid strenuous exercise. °· Ask your health care provider when it is okay to: °? Resume your normal activities. °? Return to work or school. °? Resume sexual activity. °Eating and drinking °· Eat a heart-healthy diet. This should include plenty of fresh fruits and vegetables, whole grains, low-fat dairy products, and lean protein like chicken and fish. °· Limit alcohol intake to no more than 1 drink a day for non-pregnant women and 2 drinks a day for men. One drink equals 12 oz of beer, 5 oz of wine, or 1½ oz of hard liquor. °· Check ingredients and nutrition facts on packaged foods and beverages. Avoid the following types of food: °? Food that is high in salt (sodium). °? Food that is high in saturated fat, like full-fat dairy or red meat. °? Food that is high in trans fat, like fried food. °? Food and drinks that are high in sugar. °Lifestyle °· Do not use any products that contain nicotine or tobacco, such as cigarettes and e-cigarettes. If you need help quitting, ask your health care provider. °· Take   Take steps to manage and control your weight.  Get regular exercise. Aim for 150 minutes of moderate-intensity exercise (such as walking or yoga) or 75 minutes of vigorous exercise (such as running or swimming) each week.  Manage other health problems, such as diabetes or high blood pressure. Ask your health care provider how you can manage these conditions. General instructions  Do not drive for 24 hours after your procedure if you were given a medicine to help you relax (sedative).  Take over-the-counter and prescription medicines only as told  by your health care provider.  Avoid putting pressure on the area where the pacemaker was placed.  If you need an MRI after your pacemaker has been placed, be sure to tell the health care provider who orders the MRI that you have a pacemaker.  Avoid close and prolonged exposure to electrical devices that have strong magnetic fields. These include: ? Cell phones. Avoid keeping them in a pocket near the pacemaker, and try using the ear opposite the pacemaker. ? MP3 players. ? Household appliances, like microwaves. ? Metal detectors. ? Electric generators. ? High-tension wires.  Keep all follow-up visits as directed by your health care provider. This is important. Contact a health care provider if:  You have pain at the incision site that is not relieved by over-the-counter or prescription medicines.  You have any of these around your incision site or coming from it: ? More redness, swelling, or pain. ? Fluid or blood. ? Warmth to the touch. ? Pus or a bad smell.  You have a fever.  You feel brief, occasional palpitations, light-headedness, or any symptoms that you think might be related to your heart. Get help right away if:  You experience chest pain that is different from the pain at the pacemaker site.  You develop a red streak that extends above or below the incision site.  You experience shortness of breath.  You have palpitations or an irregular heartbeat.  You have light-headedness that does not go away quickly.  You faint or have dizzy spells.  Your pulse suddenly drops or increases rapidly and does not return to normal.  You begin to gain weight and your legs and ankles swell. Summary  After your procedure, it is common to have pain, soreness, and some swelling where the pacemaker was inserted.  Make sure to keep your incision clean and dry. Follow instructions from your health care provider about how to take care of your incision.  Check your incision every  day for signs of infection, such as more pain or swelling, pus or a bad smell, warmth, or leaking fluid and blood.  Avoid strenuous exercise and lifting your left arm higher than your shoulder for 2 weeks, or as long as told by your health care provider. This information is not intended to replace advice given to you by your health care provider. Make sure you discuss any questions you have with your health care provider. Document Released: 06/14/2013 Document Revised: 07/16/2016 Document Reviewed: 07/16/2016 Elsevier Interactive Patient Education  2019 [employer]. Post procedure care instructions Keep incision clean and dry for 10 days. No driving for 2 days.  You can remove outer dressing tomorrow. Leave steri-strips (little pieces of tape) on until seen in the office for wound check appointment. Call the office [phone]) for redness, drainage, swelling, or fever.

## 2019-01-11 NOTE — H&P (Signed)
Cardiology Admission History and Physical:   Patient ID: Zachary Berry MRN: [id number]; DOB: September 22, 1955   Admission date: 01/11/2019  Primary Care Provider: System, Pcp Not In Primary Cardiologist: Dr. Eldridge Dace Primary Electrophysiologist:  Dr. Johney Frame  Chief Complaint:  ICD battery depletion  Patient Profile:   Zachary Berry is a 63 y.o. male with PMHx of CAD, ICM w/ICD, chronic CHF (systolic), HTN, HLD, and DM  History of Present Illness:   Zachary Berry has MDT ICD that reached RRT in March, he presents today for generator change. He is well known to me.  He ha done well with medical therapy for CAD and CHF.  His device has reached elective replacement time. Denies fevers, chills, cough or SOB above baseline.  No symptoms of COVID 19.   Past Medical History:  Diagnosis Date  . CAD (coronary artery disease)    a. s/p Ant STEMI >> LHC (04/03/2009): EF 30%, proximal LAD occluded >>> PCI: Xience DES to prox LAD.;  b.  LHC (9/15): Proximal LAD stent patent  . Chronic systolic heart failure (HCC)   . Depression   . Diabetes mellitus   . HTN (hypertension)   . Hyperlipidemia   . Ischemic cardiomyopathy    a.  Echo (12/2012): EF 30-35%, anteroseptal hypokinesis, mild LAE;   b.  Cardiac MRI (06/2010): Full thickness scar of the dist ant wall, septum, apex with 2/3 thickness scar of mid and basal ant wall, EF 34%.  . Learning disability   . Poor vision   . S/P implantation of automatic cardioverter/defibrillator (AICD)     Past Surgical History:  Procedure Laterality Date  . HAND SURGERY    . ICD Implant  07/18/10   MDT ICD implanted by Dr Johney Frame  . LEFT HEART CATHETERIZATION WITH CORONARY ANGIOGRAM N/A 05/25/2014   Procedure: LEFT HEART CATHETERIZATION WITH CORONARY ANGIOGRAM;  Surgeon: Thurmon Fair, MD;  Location: [facility];  Service: Cardiovascular;  Laterality: N/A;  . PCI LAD  7/10     Medications Prior to Admission: Prior to Admission medications    Medication Sig Start Date End Date Taking? Authorizing Provider  aspirin 325 MG tablet Take 325 mg by mouth at bedtime.   Yes [provider]  Dulaglutide (TRULICITY) 0.75 MG/0.5ML SOPN Inject 0.75 mg into the skin every Monday.   Yes [provider]  fenofibrate (TRICOR) 145 MG tablet Take 145 mg by mouth every evening.  11/23/17  Yes [provider]  furosemide (LASIX) 40 MG tablet TAKE 1 TABLET BY MOUTH ONCE DAILY. Patient taking differently: Take 40 mg by mouth every evening.  09/14/16  Yes Laurey Morale, MD  glipiZIDE (GLUCOTROL) 10 MG tablet Take 10 mg by mouth 2 (two) times daily before a meal.     Yes [provider]  insulin NPH-regular Human (70-30) 100 UNIT/ML injection Inject 15-25 Units into the skin See admin instructions. 25 units in the morning & 15 unit in the afternoon   Yes [provider]  metFORMIN (GLUCOPHAGE) 1000 MG tablet Take 1,000 mg by mouth 2 (two) times daily.  04/08/15  Yes [provider]  nitroGLYCERIN (NITROSTAT) 0.4 MG SL tablet Place 1 tablet (0.4 mg total) under the tongue every 5 (five) minutes as needed for chest pain. 08/13/16  Yes Seiler, Amber K, NP  rosuvastatin (CRESTOR) 40 MG tablet Take 1 tablet (40 mg total) by mouth daily. Patient taking differently: Take 40 mg by mouth at bedtime.  08/13/16  Yes Seiler, [employer],  NP  sertraline (ZOLOFT) 100 MG tablet Take 100 mg by mouth 2 (two) times daily.    Yes [provider]  clopidogrel (PLAVIX) 75 MG tablet Take 1 tablet (75 mg total) by mouth daily. 04/18/13   Laurey Morale, MD   he has been taking enalapril and coreg also.  Allergies:   No Known Allergies  Social History:   Social History   Socioeconomic History  . Marital status: Married    Spouse name: Not on file  . Number of children: Not on file  . Years of education: Not on file  . Highest education level: Not on file  Occupational History  . Occupation: Disabled    Comment: due  to CAD  Social Needs  . Financial resource strain: Not on file  . Food insecurity:    Worry: Not on file    Inability: Not on file  . Transportation needs:    Medical: Not on file    Non-medical: Not on file  Tobacco Use  . Smoking status: Never Smoker  . Smokeless tobacco: Never Used  . Tobacco comment: former passive smoker  Substance and Sexual Activity  . Alcohol use: No  . Drug use: No  . Sexual activity: Not on file  Lifestyle  . Physical activity:    Days per week: Not on file    Minutes per session: Not on file  . Stress: Not on file  Relationships  . Social connections:    Talks on phone: Not on file    Gets together: Not on file    Attends religious service: Not on file    Active member of club or organization: Not on file    Attends meetings of clubs or organizations: Not on file    Relationship status: Not on file  . Intimate partner violence:    Fear of current or ex partner: Not on file    Emotionally abused: Not on file    Physically abused: Not on file    Forced sexual activity: Not on file  Other Topics Concern  . Not on file  Social History Narrative  . Not on file    Family History:  The patient's family history includes Diabetes in his brother, father, mother, and sister; Heart attack (age of onset: 50) in his brother; Heart disease in his mother; Heart failure in his mother; Hypertension in his father; Stomach cancer in an other family member; Stroke in his mother; Sudden death in his brother.    ROS:  Please see the history of present illness.  All other ROS reviewed and negative.     Physical Exam/Data:   Vitals:   01/11/19 1149  BP: 139/88  Pulse: 70  Temp: 98.2 F (36.8 C)  TempSrc: Oral  SpO2: 95%   No intake or output data in the 24 hours ending 01/11/19 1210 Last 3 Weights 01/18/2018 10/18/2017 08/13/2016  Weight (lbs) 180 lb 181 lb 176 lb  Weight (kg) 81.647 kg 82.101 kg 79.833 kg     There is no height or weight on file to  calculate BMI.  General:  Well nourished, well developed, in no acute distress HEENT: normal Lymph: no adenopathy Neck: no JVD Endocrine:  No thryomegaly Vascular: No carotid bruits; FA pulses 2+ bilaterally without bruits  Cardiac:  normal S1, S2; RRR; no murmur  Lungs:  clear to auscultation bilaterally, no wheezing, rhonchi or rales  Abd: soft, nontender, no hepatomegaly  Ext: no edema Musculoskeletal:  No deformities,  BUE and BLE strength normal and equal Skin: warm and dry  Neuro:  CNs 2-12 intact, no focal abnormalities noted Psych:  Normal affect    EKG:  Sinus rhythm, QRS 112 msec.  Relevant CV Studies:  Laboratory Data:  ChemistryNo results for input(s): NA, K, CL, CO2, GLUCOSE, BUN, CREATININE, CALCIUM, GFRNONAA, GFRAA, ANIONGAP in the last 168 hours.  No results for input(s): PROT, ALBUMIN, AST, ALT, ALKPHOS, BILITOT in the last 168 hours. HematologyNo results for input(s): WBC, RBC, HGB, HCT, MCV, MCH, MCHC, RDW, PLT in the last 168 hours. Cardiac EnzymesNo results for input(s): TROPONINI in the last 168 hours. No results for input(s): TROPIPOC in the last 168 hours.  BNPNo results for input(s): BNP, PROBNP in the last 168 hours.  DDimer No results for input(s): DDIMER in the last 168 hours.  Radiology/Studies:  No results found.  Assessment and Plan:   1. Ischemic CM/ CAD Device interrogation confirms ERI battery status. QRS < 130 msec,   He does not meet criteria for CRT. Risks, benefits, and alternatives to ICD pulse generator replacement were discussed in detail today.  The patient understands that risks include but are not limited to bleeding, infection, pneumothorax, perforation, tamponade, vascular damage, renal failure, MI, stroke, death, inappropriate shocks, damage to his existing leads, and lead dislodgement and wishes to proceed.     For questions or updates, please contact [facility] Please consult [email or web address] for contact info under    Hillis Range MD, [facility] 01/11/2019 12:40 PM   ICD Criteria  Current LVEF:35%. Within 12 months prior to implant: Yes   Heart failure history: Yes, Class II  Cardiomyopathy history: Yes, Ischemic Cardiomyopathy - Prior MI.  Atrial Fibrillation/Atrial Flutter: No.  Ventricular tachycardia history: No.  Cardiac arrest history: No.  History of syndromes with risk of sudden death: No.  Previous ICD: Yes, Reason for ICD:  Primary prevention.  Current ICD indication: Primary  PPM indication: No.  Class I or II Bradycardia indication present: No  Beta Blocker therapy for 3 or more months: Yes, prescribed.   Ace Inhibitor/ARB therapy for 3 or more months: Yes, prescribed.    I have seen Zachary Berry is a 63 y.o. male consideration of ICD generator change for ERI battery status and primary prevention of sudden death.  The patient's chart has been reviewed and they meet criteria for ICD implant.  I have had a thorough discussion with the patient reviewing options.  The patient has had opportunities to ask questions and have them answered. The patient and I have decided together through the [facility] Share Decision Support Tool to proceed with ICD generator change at this time.  Risks, benefits, alternatives to ICD generator change were discussed in detail with the patient today. The patient  understands that the risks include but are not limited to bleeding, infection, pneumothorax, perforation, tamponade, vascular damage, renal failure, MI, stroke, death, inappropriate shocks, and lead dislodgement and wishes to proceed.   Hillis Range MD, [facility] 01/11/2019 12:42 PM

## 2019-01-12 ENCOUNTER — Encounter (HOSPITAL_COMMUNITY): Payer: Self-pay | Admitting: Internal Medicine

## 2019-01-16 ENCOUNTER — Ambulatory Visit: Payer: Medicare HMO

## 2019-01-18 ENCOUNTER — Encounter: Payer: Medicare HMO | Admitting: Internal Medicine

## 2019-01-24 ENCOUNTER — Telehealth: Payer: Self-pay | Admitting: Internal Medicine

## 2019-01-24 NOTE — Telephone Encounter (Signed)
Spoke with patient regarding concerns about coming into the office d/t the virus. Explained all precautions being taken to ensure safety to patients and staff. Patient agrees to keep appointment. Patient expressed concerns about transportation, patient states he will call this afternoon to reschedule if he is unable to find transportation.

## 2019-01-24 NOTE — Telephone Encounter (Signed)
New Message    Pt is concerned with the virus and doesn't want to come out for his appt. He is wondering if he can take the bandages off himself    Please call

## 2019-01-25 ENCOUNTER — Other Ambulatory Visit: Payer: Self-pay

## 2019-01-25 ENCOUNTER — Ambulatory Visit (INDEPENDENT_AMBULATORY_CARE_PROVIDER_SITE_OTHER): Payer: Medicare HMO | Admitting: *Deleted

## 2019-01-25 DIAGNOSIS — I5022 Chronic systolic (congestive) heart failure: Secondary | ICD-10-CM

## 2019-01-25 LAB — CUP PACEART INCLINIC DEVICE CHECK
Battery Remaining Longevity: 129 mo
Battery Voltage: 3.09 V
Brady Statistic AP VP Percent: 0.01 %
Brady Statistic AP VS Percent: 20.73 %
Brady Statistic AS VP Percent: 0.02 %
Brady Statistic AS VS Percent: 79.23 %
Brady Statistic RA Percent Paced: 20.74 %
Brady Statistic RV Percent Paced: 0.04 %
Date Time Interrogation Session: 20200520095921
HighPow Impedance: 52 Ohm
HighPow Impedance: 68 Ohm
Implantable Lead Implant Date: 20111111
Implantable Lead Implant Date: 20111111
Implantable Lead Location: 753859
Implantable Lead Location: 753860
Implantable Lead Model: 5076
Implantable Lead Model: 6947
Implantable Pulse Generator Implant Date: 20200506
Lead Channel Impedance Value: 437 Ohm
Lead Channel Impedance Value: 494 Ohm
Lead Channel Impedance Value: 513 Ohm
Lead Channel Pacing Threshold Amplitude: 0.75 V
Lead Channel Pacing Threshold Amplitude: 0.75 V
Lead Channel Pacing Threshold Pulse Width: 0.4 ms
Lead Channel Pacing Threshold Pulse Width: 0.4 ms
Lead Channel Sensing Intrinsic Amplitude: 16.625 mV
Lead Channel Sensing Intrinsic Amplitude: 3.875 mV
Lead Channel Setting Pacing Amplitude: 2 V
Lead Channel Setting Pacing Amplitude: 2.5 V
Lead Channel Setting Pacing Pulse Width: 0.4 ms
Lead Channel Setting Sensing Sensitivity: 0.3 mV

## 2019-01-25 NOTE — Progress Notes (Signed)
Wound check appointment. Steri-strips removed. Wound without redness or edema. Incision edges approximated with exception of left corner, small unapproximated area noted. No drainage or stitch noted. Bandaid placed over area, educated patient to leave bandaid on for 24 hours, call clinic if redness, swelling, drainage, fever, or chills. Normal device function. Thresholds, sensing, and impedances consistent with implant measurements. Outputs at chronic settings s/p gen change. RA/RV max lead impedances increased to 2000 ohms per protocol. Histogram distribution appropriate for patient and level of activity. No mode switches or ventricular arrhythmias noted. Patient educated about wound care, shock plan, and pairing new device with home monitor. ROV 04/17/19 with Dr. Johney Frame.

## 2019-01-25 NOTE — Patient Instructions (Signed)
Leave bandaid on for 24 hours. Call the device clinic if drainage, redness, swelling, fever, or chills are noted. [phone]

## 2019-02-13 DIAGNOSIS — I10 Essential (primary) hypertension: Secondary | ICD-10-CM | POA: Diagnosis not present

## 2019-02-13 DIAGNOSIS — E785 Hyperlipidemia, unspecified: Secondary | ICD-10-CM | POA: Diagnosis not present

## 2019-02-13 DIAGNOSIS — E119 Type 2 diabetes mellitus without complications: Secondary | ICD-10-CM | POA: Diagnosis not present

## 2019-02-17 DIAGNOSIS — F329 Major depressive disorder, single episode, unspecified: Secondary | ICD-10-CM | POA: Diagnosis not present

## 2019-02-17 DIAGNOSIS — Z95810 Presence of automatic (implantable) cardiac defibrillator: Secondary | ICD-10-CM | POA: Diagnosis not present

## 2019-02-17 DIAGNOSIS — E1129 Type 2 diabetes mellitus with other diabetic kidney complication: Secondary | ICD-10-CM | POA: Diagnosis not present

## 2019-02-17 DIAGNOSIS — I10 Essential (primary) hypertension: Secondary | ICD-10-CM | POA: Diagnosis not present

## 2019-02-17 DIAGNOSIS — I429 Cardiomyopathy, unspecified: Secondary | ICD-10-CM | POA: Diagnosis not present

## 2019-02-17 DIAGNOSIS — E785 Hyperlipidemia, unspecified: Secondary | ICD-10-CM | POA: Diagnosis not present

## 2019-02-17 DIAGNOSIS — E1169 Type 2 diabetes mellitus with other specified complication: Secondary | ICD-10-CM | POA: Diagnosis not present

## 2019-02-21 ENCOUNTER — Telehealth: Payer: Self-pay

## 2019-02-21 NOTE — Telephone Encounter (Signed)
Unable to leave a message for patient to remind of missed remote transmission.  

## 2019-02-22 ENCOUNTER — Telehealth (HOSPITAL_COMMUNITY): Payer: Self-pay | Admitting: Radiology

## 2019-02-22 NOTE — Telephone Encounter (Signed)
Called patient to schedule echo per WQ. Patient stated and it was noted patient had echo 12/26/18. Sent inbasket to Dr. Rayann Heman to verify if patient needs to have another echo.

## 2019-03-06 NOTE — Progress Notes (Signed)
No ICM remote transmission received for 02/20/2019 and next ICM transmission scheduled for 03/27/2019.

## 2019-03-28 ENCOUNTER — Telehealth: Payer: Self-pay

## 2019-03-28 NOTE — Telephone Encounter (Signed)
Spoke with patient to remind of missed remote transmission 

## 2019-04-07 NOTE — Progress Notes (Signed)
No ICM remote transmission received for 03/27/2019 and next ICM transmission scheduled for 05/22/2019.   Patient has office defib check with Dr Rayann Heman 04/17/2019

## 2019-04-12 ENCOUNTER — Telehealth: Payer: Self-pay | Admitting: Internal Medicine

## 2019-04-12 NOTE — Telephone Encounter (Signed)
New Message   Patient is calling because he feels that the 8/10 appt is to early and he was under the impression that he did not have to come back for a year. Please call to discuss.

## 2019-04-13 ENCOUNTER — Telehealth: Payer: Self-pay

## 2019-04-13 NOTE — Telephone Encounter (Signed)
Spoke with pt regarding appt on 04/17/19. Pt stated he will have someone to help him set up his MyChart account. Pt was advise to check vitals prior to appt. Pt questions were address.

## 2019-04-14 NOTE — Telephone Encounter (Signed)
April spoke with Pt 8/6 and Pt did not indicate that he had any issues with appt.  This is normal 91 day f/u from gen change.  No action needed.

## 2019-04-17 ENCOUNTER — Other Ambulatory Visit: Payer: Self-pay

## 2019-04-17 ENCOUNTER — Telehealth (INDEPENDENT_AMBULATORY_CARE_PROVIDER_SITE_OTHER): Payer: Medicare HMO | Admitting: Internal Medicine

## 2019-04-17 ENCOUNTER — Encounter: Payer: Self-pay | Admitting: Internal Medicine

## 2019-04-17 ENCOUNTER — Encounter: Payer: Medicare HMO | Admitting: *Deleted

## 2019-04-17 VITALS — BP 142/82 | HR 66 | Ht 68.0 in | Wt 179.0 lb

## 2019-04-17 DIAGNOSIS — I5022 Chronic systolic (congestive) heart failure: Secondary | ICD-10-CM | POA: Diagnosis not present

## 2019-04-17 DIAGNOSIS — I255 Ischemic cardiomyopathy: Secondary | ICD-10-CM

## 2019-04-17 DIAGNOSIS — Z95810 Presence of automatic (implantable) cardiac defibrillator: Secondary | ICD-10-CM | POA: Diagnosis not present

## 2019-04-17 DIAGNOSIS — I110 Hypertensive heart disease with heart failure: Secondary | ICD-10-CM | POA: Diagnosis not present

## 2019-04-17 DIAGNOSIS — I25118 Atherosclerotic heart disease of native coronary artery with other forms of angina pectoris: Secondary | ICD-10-CM

## 2019-04-17 NOTE — Patient Instructions (Signed)
Called pt and discussed the following. He agrees with plan and had no additional questions.  Medication Instructions:  Your physician recommends that you continue on your current medications as directed. Please refer to the Current Medication list given to you today.   Labwork: None ordered.   Testing/Procedures: None ordered.   Follow-Up: Your physician recommends that you schedule a follow-up appointment in: One Year with EP APP  Any Other Special Instructions Will Be Listed Below (If Applicable).     If you need a refill on your cardiac medications before your next appointment, please call your pharmacy.

## 2019-04-17 NOTE — Progress Notes (Signed)
Electrophysiology TeleHealth Note  Due to national recommendations of social distancing due to [place] 19, an audio telehealth visit is felt to be most appropriate for this patient at this time.  Verbal consent was obtained by me for the telehealth visit today.  The patient does not have capability for a virtual visit.  A phone visit is therefore required today.   Date:  04/17/2019   ID:  [name], DOB 07-Feb-1956, MRN [id number]  Location: patient's home  Provider location:  [facility]  Evaluation Performed: Follow-up visit  PCP:  System, Pcp Not In   Electrophysiologist:  Dr Rayann Heman  Chief Complaint:  CHF  History of Present Illness:    Zachary Berry is a 63 y.o. male who presents via telehealth conferencing today.  Since his generator change, the patient reports doing very well.  Denies procedure related complications.  Today, he denies symptoms of palpitations, chest pain, shortness of breath,  lower extremity edema, dizziness, presyncope, or syncope.  The patient is otherwise without complaint today.  The patient denies symptoms of fevers, chills, cough, or new SOB worrisome for COVID 19.  Past Medical History:  Diagnosis Date  . CAD (coronary artery disease)    a. s/p Ant STEMI >> LHC (04/03/2009): EF 30%, proximal LAD occluded >>> PCI: Xience DES to prox LAD.;  b.  LHC (9/15): Proximal LAD stent patent  . Chronic systolic heart failure ([place])   . Depression   . Diabetes mellitus   . HTN (hypertension)   . Hyperlipidemia   . Ischemic cardiomyopathy    a.  Echo (12/2012): EF 30-35%, anteroseptal hypokinesis, mild LAE;   b.  Cardiac MRI (06/2010): Full thickness scar of the dist ant wall, septum, apex with 2/3 thickness scar of mid and basal ant wall, EF 34%.  . Learning disability   . Poor vision   . S/P implantation of automatic cardioverter/defibrillator (AICD)     Past Surgical History:  Procedure Laterality Date  . HAND SURGERY    . ICD GENERATOR  CHANGEOUT N/A 01/11/2019   Procedure: ICD GENERATOR CHANGEOUT;  Surgeon: Thompson Grayer, MD;  Location: [facility];  Service: Cardiovascular;  Laterality: N/A;  . ICD Implant  07/18/10   MDT ICD implanted by Dr Rayann Heman  . LEFT HEART CATHETERIZATION WITH CORONARY ANGIOGRAM N/A 05/25/2014   Procedure: LEFT HEART CATHETERIZATION WITH CORONARY ANGIOGRAM;  Surgeon: Sanda Klein, MD;  Location: [facility];  Service: Cardiovascular;  Laterality: N/A;  . PCI LAD  7/10    Current Outpatient Medications  Medication Sig Dispense Refill  . aspirin 325 MG tablet Take 325 mg by mouth at bedtime.    . clopidogrel (PLAVIX) 75 MG tablet Take 1 tablet (75 mg total) by mouth daily. 90 tablet 1  . Dulaglutide (TRULICITY) 2.67 TI/4.5YK SOPN Inject 0.75 mg into the skin every Monday.    . fenofibrate (TRICOR) 145 MG tablet Take 145 mg by mouth every evening.     . furosemide (LASIX) 40 MG tablet TAKE 1 TABLET BY MOUTH ONCE DAILY. (Patient taking differently: Take 40 mg by mouth every evening. ) 90 tablet 1  . glipiZIDE (GLUCOTROL) 10 MG tablet Take 10 mg by mouth 2 (two) times daily before a meal.      . insulin NPH-regular Human (70-30) 100 UNIT/ML injection Inject 15-25 Units into the skin See admin instructions. 25 units in the morning & 15 unit in the afternoon    . metFORMIN (GLUCOPHAGE) 1000 MG  tablet Take 1,000 mg by mouth 2 (two) times daily.     . nitroGLYCERIN (NITROSTAT) 0.4 MG SL tablet Place 1 tablet (0.4 mg total) under the tongue every 5 (five) minutes as needed for chest pain. 25 tablet 3  . sertraline (ZOLOFT) 100 MG tablet Take 100 mg by mouth 2 (two) times daily.      No current facility-administered medications for this visit.    Allergies:   Patient has no known allergies.   Social History:  The patient  reports that he has never smoked. He has never used smokeless tobacco. He reports that he does not drink alcohol or use drugs.   Family History:  The patient's  family history  includes Diabetes in his brother, father, mother, and sister; Heart attack (age of onset: 67) in his brother; Heart disease in his mother; Heart failure in his mother; Hypertension in his father; Stomach cancer in an other family member; Stroke in his mother; Sudden death in his brother.   ROS:  Please see the history of present illness.   All other systems are personally reviewed and negative.    Exam:    Vital Signs:  BP (!) 142/82   Pulse 66   Ht 5\' 8"  (1.727 m)   Wt 179 lb (81.2 kg)   BMI 27.22 kg/m   Well sounding    Labs/Other Tests and Data Reviewed:    Recent Labs: 01/11/2019: BUN 23; Creatinine, Ser 1.16; Hemoglobin 15.8; Platelets 337; Potassium 3.9; Sodium 134   Wt Readings from Last 3 Encounters:  04/17/19 179 lb (81.2 kg)  01/11/19 175 lb (79.4 kg)  01/18/18 180 lb (81.6 kg)     Last device interrogation is reviewed personally   ASSESSMENT & PLAN:    1.  Chronic systolic dysfunction/ ischemic CM Doing well s/p ICD generator change Followed in ICM device clinic Overdue to see Dr Eldridge Dace  2. HTN Stable No change required today  Follow-up:  Continue remote monitoring Return to see EP APP In a year   Patient Risk:  after full review of this patients clinical status, I feel that they are at moderate risk at this time.  Today, I have spent 15 minutes with the patient with telehealth technology discussing arrhythmia management .    SignedHillis Range, MD  04/17/2019 2:10 PM     [facility] [address] [place] [address] [phone] (office) [phone] (fax)

## 2019-04-26 ENCOUNTER — Encounter: Payer: Self-pay | Admitting: Cardiology

## 2019-05-22 NOTE — Progress Notes (Signed)
No ICM remote transmission received for 05/22/2019 and next ICM transmission scheduled for 07/03/2019.   

## 2019-06-06 DIAGNOSIS — Z9181 History of falling: Secondary | ICD-10-CM | POA: Diagnosis not present

## 2019-06-06 DIAGNOSIS — Z0000 Encounter for general adult medical examination without abnormal findings: Secondary | ICD-10-CM | POA: Diagnosis not present

## 2019-06-06 DIAGNOSIS — E785 Hyperlipidemia, unspecified: Secondary | ICD-10-CM | POA: Diagnosis not present

## 2019-06-06 DIAGNOSIS — Z125 Encounter for screening for malignant neoplasm of prostate: Secondary | ICD-10-CM | POA: Diagnosis not present

## 2019-06-06 DIAGNOSIS — Z1331 Encounter for screening for depression: Secondary | ICD-10-CM | POA: Diagnosis not present

## 2019-07-04 ENCOUNTER — Telehealth: Payer: Self-pay

## 2019-07-04 NOTE — Telephone Encounter (Signed)
Left message for patient to remind of missed remote transmission.  

## 2019-07-10 NOTE — Progress Notes (Signed)
Unable to reach patient since 12/12/2018 and no remote transmissions received since battery change 01/11/2019.  Disenrolled in [facility] clinic but device clinic will continue to monitor transmissions every 91 days per protocol.

## 2019-07-20 DIAGNOSIS — Z125 Encounter for screening for malignant neoplasm of prostate: Secondary | ICD-10-CM | POA: Diagnosis not present

## 2019-07-20 DIAGNOSIS — Z23 Encounter for immunization: Secondary | ICD-10-CM | POA: Diagnosis not present

## 2019-07-20 DIAGNOSIS — Z794 Long term (current) use of insulin: Secondary | ICD-10-CM | POA: Diagnosis not present

## 2019-07-20 DIAGNOSIS — E785 Hyperlipidemia, unspecified: Secondary | ICD-10-CM | POA: Diagnosis not present

## 2019-07-20 DIAGNOSIS — I10 Essential (primary) hypertension: Secondary | ICD-10-CM | POA: Diagnosis not present

## 2019-07-20 DIAGNOSIS — F339 Major depressive disorder, recurrent, unspecified: Secondary | ICD-10-CM | POA: Diagnosis not present

## 2019-07-20 DIAGNOSIS — Z95810 Presence of automatic (implantable) cardiac defibrillator: Secondary | ICD-10-CM | POA: Diagnosis not present

## 2019-07-20 DIAGNOSIS — Z6826 Body mass index (BMI) 26.0-26.9, adult: Secondary | ICD-10-CM | POA: Diagnosis not present

## 2019-07-20 DIAGNOSIS — E1169 Type 2 diabetes mellitus with other specified complication: Secondary | ICD-10-CM | POA: Diagnosis not present

## 2019-07-20 DIAGNOSIS — E119 Type 2 diabetes mellitus without complications: Secondary | ICD-10-CM | POA: Diagnosis not present

## 2019-08-15 ENCOUNTER — Telehealth: Payer: Self-pay

## 2019-08-15 NOTE — Telephone Encounter (Signed)
Spoke with patient to remind of missed remote transmission 

## 2019-12-11 NOTE — Progress Notes (Signed)
Phone: [phone]; Fax: [phone]

## 2019-12-12 ENCOUNTER — Telehealth: Payer: Self-pay

## 2019-12-12 ENCOUNTER — Other Ambulatory Visit: Payer: Self-pay

## 2019-12-12 ENCOUNTER — Encounter: Payer: Self-pay | Admitting: Interventional Cardiology

## 2019-12-12 ENCOUNTER — Ambulatory Visit: Payer: Medicare HMO | Admitting: Interventional Cardiology

## 2019-12-12 VITALS — BP 128/72 | HR 60 | Ht 68.0 in | Wt 181.4 lb

## 2019-12-12 DIAGNOSIS — I10 Essential (primary) hypertension: Secondary | ICD-10-CM | POA: Diagnosis not present

## 2019-12-12 DIAGNOSIS — Z95810 Presence of automatic (implantable) cardiac defibrillator: Secondary | ICD-10-CM

## 2019-12-12 DIAGNOSIS — I25118 Atherosclerotic heart disease of native coronary artery with other forms of angina pectoris: Secondary | ICD-10-CM | POA: Diagnosis not present

## 2019-12-12 DIAGNOSIS — I5022 Chronic systolic (congestive) heart failure: Secondary | ICD-10-CM

## 2019-12-12 NOTE — Patient Instructions (Signed)
Medication Instructions:  Your physician recommends that you continue on your current medications as directed. Please refer to the Current Medication list given to you today.  *If you need a refill on your cardiac medications before your next appointment, please call your pharmacy*   Lab Work: None ordered  If you have labs (blood work) drawn today and your tests are completely normal, you will receive your results only by: . MyChart Message (if you have MyChart) OR . A paper copy in the mail If you have any lab test that is abnormal or we need to change your treatment, we will call you to review the results.   Testing/Procedures: None ordered   Follow-Up: At [facility], you and your health needs are our priority.  As part of our continuing mission to provide you with exceptional heart care, we have created designated Provider Care Teams.  These Care Teams include your primary Cardiologist (physician) and Advanced Practice Providers (APPs -  Physician Assistants and Nurse Practitioners) who all work together to provide you with the care you need, when you need it.  We recommend signing up for the patient portal called "MyChart".  Sign up information is provided on this After Visit Summary.  MyChart is used to connect with patients for Virtual Visits (Telemedicine).  Patients are able to view lab/test results, encounter notes, upcoming appointments, etc.  Non-urgent messages can be sent to your provider as well.   To learn more about what you can do with MyChart, go to [email or web address].    Your next appointment:   12 month(s)  The format for your next appointment:   In Person  Provider:   You may see Jayadeep Varanasi, MD or one of the following Advanced Practice Providers on your designated Care Team:    Dayna Dunn, PA-C  Michele Lenze, PA-C    Other Instructions  High-Fiber Diet Fiber, also called dietary fiber, is a type of carbohydrate that is found in fruits,  vegetables, whole grains, and beans. A high-fiber diet can have many health benefits. Your health care provider may recommend a high-fiber diet to help:  Prevent constipation. Fiber can make your bowel movements more regular.  Lower your cholesterol.  Relieve the following conditions: ? Swelling of veins in the anus (hemorrhoids). ? Swelling and irritation (inflammation) of specific areas of the digestive tract (uncomplicated diverticulosis). ? A problem of the large intestine (colon) that sometimes causes pain and diarrhea (irritable bowel syndrome, IBS).  Prevent overeating as part of a weight-loss plan.  Prevent heart disease, type 2 diabetes, and certain cancers. What is my plan? The recommended daily fiber intake in grams (g) includes:  38 g for men age 50 or younger.  30 g for men over age 50.  25 g for women age 50 or younger.  21 g for women over age 50. You can get the recommended daily intake of dietary fiber by:  Eating a variety of fruits, vegetables, grains, and beans.  Taking a fiber supplement, if it is not possible to get enough fiber through your diet. What do I need to know about a high-fiber diet?  It is better to get fiber through food sources rather than from fiber supplements. There is not a lot of research about how effective supplements are.  Always check the fiber content on the nutrition facts label of any prepackaged food. Look for foods that contain 5 g of fiber or more per serving.  Talk with a diet and nutrition specialist (  dietitian) if you have questions about specific foods that are recommended or not recommended for your medical condition, especially if those foods are not listed below.  Gradually increase how much fiber you consume. If you increase your intake of dietary fiber too quickly, you may have bloating, cramping, or gas.  Drink plenty of water. Water helps you to digest fiber. What are tips for following this plan?  Eat a wide  variety of high-fiber foods.  Make sure that half of the grains that you eat each day are whole grains.  Eat breads and cereals that are made with whole-grain flour instead of refined flour or white flour.  Eat brown rice, bulgur wheat, or millet instead of white rice.  Start the day with a breakfast that is high in fiber, such as a cereal that contains 5 g of fiber or more per serving.  Use beans in place of meat in soups, salads, and pasta dishes.  Eat high-fiber snacks, such as berries, raw vegetables, nuts, and popcorn.  Choose whole fruits and vegetables instead of processed forms like juice or sauce. What foods can I eat?  Fruits Berries. Pears. Apples. Oranges. Avocado. Prunes and raisins. Dried figs. Vegetables Sweet potatoes. Spinach. Kale. Artichokes. Cabbage. Broccoli. Cauliflower. Green peas. Carrots. Squash. Grains Whole-grain breads. Multigrain cereal. Oats and oatmeal. Brown rice. Barley. Bulgur wheat. Millet. Quinoa. Bran muffins. Popcorn. Rye wafer crackers. Meats and other proteins Navy, kidney, and pinto beans. Soybeans. Split peas. Lentils. Nuts and seeds. Dairy Fiber-fortified yogurt. Beverages Fiber-fortified soy milk. Fiber-fortified orange juice. Other foods Fiber bars. The items listed above may not be a complete list of recommended foods and beverages. Contact a dietitian for more options. What foods are not recommended? Fruits Fruit juice. Cooked, strained fruit. Vegetables Fried potatoes. Canned vegetables. Well-cooked vegetables. Grains White bread. Pasta made with refined flour. White rice. Meats and other proteins Fatty cuts of meat. Fried chicken or fried fish. Dairy Milk. Yogurt. Cream cheese. Sour cream. Fats and oils Butters. Beverages Soft drinks. Other foods Cakes and pastries. The items listed above may not be a complete list of foods and beverages to avoid. Contact a dietitian for more information. Summary  Fiber is a type of  carbohydrate. It is found in fruits, vegetables, whole grains, and beans.  There are many health benefits of eating a high-fiber diet, such as preventing constipation, lowering blood cholesterol, helping with weight loss, and reducing your risk of heart disease, diabetes, and certain cancers.  Gradually increase your intake of fiber. Increasing too fast can result in cramping, bloating, and gas. Drink plenty of water while you increase your fiber.  The best sources of fiber include whole fruits and vegetables, whole grains, nuts, seeds, and beans. This information is not intended to replace advice given to you by your health care provider. Make sure you discuss any questions you have with your health care provider. Document Revised: 06/28/2017 Document Reviewed: 06/28/2017 Elsevier Patient Education  2020 Elsevier Inc.   

## 2019-12-12 NOTE — Telephone Encounter (Signed)
Pt was seen in office today and his monitor is not working so I went ahead and ordered him a new one

## 2019-12-12 NOTE — Progress Notes (Signed)
Cardiology Office Note   Date:  12/12/2019   ID:  Zachary Berry, DOB 22-May-1956, MRN [id number]  PCP:  System, Pcp Not In    No chief complaint on file.  CAD  Wt Readings from Last 3 Encounters:  12/12/19 181 lb 6.4 oz (82.3 kg)  04/17/19 179 lb (81.2 kg)  01/11/19 175 lb (79.4 kg)       History of Present Illness: Zachary Berry is a 64 y.o. male  With a h/o anterior MI, and ischemic cardiomyopathy.  Prior caths showed: s/p Ant STEMI >>LHC (04/03/2009): EF 30%, proximal LAD occluded >>>PCI: Xience DES to prox LAD.; b. LHC (9/15): Proximal LAD stent patent.  Prior echo showed:  Ischemic cardiomyopathy     a. Echo (12/2012): EF 30-35%, anteroseptal hypokinesis, mild LAE; b. Cardiac MRI (06/2010): Full thickness scar of the dist ant wall, septum, apex with 2/3 thickness scar of mid and basal ant wall, EF 34%.   Seen by Dr. Johney Frame who wanted him seen by General cardiology.   Since 2010, he has not had any further revascularization.  In 2011, he had an AICD placed.  It has never discharged.   Since the last visit, he has felt well.    He walks regularly.  He does situps with an ab machine.  He is feeling well.  His remote monitoring device is not working.   Denies : Chest pain. Dizziness. Leg edema. Nitroglycerin use. Orthopnea. Palpitations. Paroxysmal nocturnal dyspnea. Shortness of breath. Syncope.     Past Medical History:  Diagnosis Date  . CAD (coronary artery disease)    a. s/p Ant STEMI >> LHC (04/03/2009): EF 30%, proximal LAD occluded >>> PCI: Xience DES to prox LAD.;  b.  LHC (9/15): Proximal LAD stent patent  . Chronic systolic heart failure (HCC)   . Depression   . Diabetes mellitus   . HTN (hypertension)   . Hyperlipidemia   . Ischemic cardiomyopathy    a.  Echo (12/2012): EF 30-35%, anteroseptal hypokinesis, mild LAE;   b.  Cardiac MRI (06/2010): Full thickness scar of the dist ant wall, septum, apex with 2/3 thickness  scar of mid and basal ant wall, EF 34%.  . Learning disability   . Poor vision   . S/P implantation of automatic cardioverter/defibrillator (AICD)     Past Surgical History:  Procedure Laterality Date  . HAND SURGERY    . ICD GENERATOR CHANGEOUT N/A 01/11/2019   Procedure: ICD GENERATOR CHANGEOUT;  Surgeon: Hillis Range, MD;  [facility]VASIVE CV LAB;  Service: Cardiovascular;  Laterality: N/A;  . ICD Implant  07/18/10   MDT ICD implanted by Dr Johney Frame  . LEFT HEART CATHETERIZATION WITH CORONARY ANGIOGRAM N/A 05/25/2014   Procedure: LEFT HEART CATHETERIZATION WITH CORONARY ANGIOGRAM;  Surgeon: Thurmon Fair, MD;  Location: [facility] CATH LAB;  Service: Cardiovascular;  Laterality: N/A;  . PCI LAD  7/10     Current Outpatient Medications  Medication Sig Dispense Refill  . aspirin 325 MG tablet Take 325 mg by mouth at bedtime.    . clopidogrel (PLAVIX) 75 MG tablet Take 1 tablet (75 mg total) by mouth daily. 90 tablet 1  . Dulaglutide (TRULICITY) 0.75 MG/0.5ML SOPN Inject 0.75 mg into the skin every Monday.    . fenofibrate (TRICOR) 145 MG tablet Take 145 mg by mouth every evening.     . furosemide (LASIX) 40 MG tablet TAKE 1 TABLET BY MOUTH ONCE DAILY. (Patient taking differently: Take 40 mg by  mouth every evening. ) 90 tablet 1  . glipiZIDE (GLUCOTROL) 10 MG tablet Take 10 mg by mouth 2 (two) times daily before a meal.      . insulin NPH-regular Human (70-30) 100 UNIT/ML injection Inject 15-25 Units into the skin See admin instructions. 25 units in the morning & 15 unit in the afternoon    . metFORMIN (GLUCOPHAGE) 1000 MG tablet Take 1,000 mg by mouth 2 (two) times daily.     . nitroGLYCERIN (NITROSTAT) 0.4 MG SL tablet Place 1 tablet (0.4 mg total) under the tongue every 5 (five) minutes as needed for chest pain. 25 tablet 3  . rosuvastatin (CRESTOR) 40 MG tablet Take 1 tablet by mouth daily.    . sertraline (ZOLOFT) 100 MG tablet Take 100 mg by mouth 2 (two) times daily.      No current  facility-administered medications for this visit.    Allergies:   Patient has no known allergies.    Social History:  The patient  reports that he has never smoked. He has never used smokeless tobacco. He reports that he does not drink alcohol or use drugs.   Family History:  The patient's family history includes Diabetes in his brother, father, mother, and sister; Heart attack (age of onset: 46) in his brother; Heart disease in his mother; Heart failure in his mother; Hypertension in his father; Stomach cancer in an other family member; Stroke in his mother; Sudden death in his brother.    ROS:  Please see the history of present illness.   Otherwise, review of systems are positive for occasional dietary indiscretion.   All other systems are reviewed and negative.    PHYSICAL EXAM: VS:  BP 128/72   Pulse 60   Ht 5\' 8"  (1.727 m)   Wt 181 lb 6.4 oz (82.3 kg)   SpO2 98%   BMI 27.58 kg/m  , BMI Body mass index is 27.58 kg/m. GEN: Well nourished, well developed, in no acute distress  HEENT: normal  Neck: no JVD, carotid bruits, or masses Cardiac: RRR; no murmurs, rubs, or gallops,no edema  Respiratory:  clear to auscultation bilaterally, normal work of breathing GI: soft, nontender, nondistended, + BS MS: no deformity or atrophy  Skin: warm and dry, no rash Neuro:  Strength and sensation are intact Psych: euthymic mood, full affect   EKG:   The ekg ordered today demonstrates atrial paced rhythm   Recent Labs: 01/11/2019: BUN 23; Creatinine, Ser 1.16; Hemoglobin 15.8; Platelets 337; Potassium 3.9; Sodium 134   Lipid Panel    Component Value Date/Time   CHOL 105 08/13/2016 0955   TRIG 132 08/13/2016 0955   HDL 39 (L) 08/13/2016 0955   CHOLHDL 2.7 08/13/2016 0955   VLDL 26 08/13/2016 0955   LDLCALC 40 08/13/2016 0955     Other studies Reviewed: Additional studies/ records that were reviewed today with results demonstrating: labs reviewed.   ASSESSMENT AND  PLAN:  1. CAD: no angina on medical therapy. Continue aggressive secondary prevention.  2. Chronic systolic heart failure: Appears euvolemic.  3. AICD: followed with Dr. Rayann Heman.  Monitor being replaced. He will receive a new one in the mail.  4. HTN: The current medical regimen is effective;  continue present plan and medications. 5. DM: 9.9 in 07/2019.  High fiber diet.  Managed with PMD.    Current medicines are reviewed at length with the patient today.  The patient concerns regarding his medicines were addressed.  The following changes have  been made:  No change  Labs/ tests ordered today include:  No orders of the defined types were placed in this encounter.   Recommend 150 minutes/week of aerobic exercise Low fat, low carb, high fiber diet recommended  Disposition:   FU in 1 year   Signed, Lance Muss, MD  12/12/2019 2:09 PM    [facility] [address] [place], [place], [place]  [address] Phone: [phone]; Fax: [phone]

## 2019-12-22 ENCOUNTER — Ambulatory Visit (INDEPENDENT_AMBULATORY_CARE_PROVIDER_SITE_OTHER): Payer: Medicare HMO | Admitting: *Deleted

## 2019-12-22 DIAGNOSIS — I5022 Chronic systolic (congestive) heart failure: Secondary | ICD-10-CM | POA: Diagnosis not present

## 2019-12-22 LAB — CUP PACEART REMOTE DEVICE CHECK
Battery Remaining Longevity: 122 mo
Battery Voltage: 3.01 V
Brady Statistic AP VP Percent: 0.01 %
Brady Statistic AP VS Percent: 10.81 %
Brady Statistic AS VP Percent: 0.03 %
Brady Statistic AS VS Percent: 89.15 %
Brady Statistic RA Percent Paced: 10.82 %
Brady Statistic RV Percent Paced: 0.04 %
Date Time Interrogation Session: 20210415225217
HighPow Impedance: 47 Ohm
HighPow Impedance: 60 Ohm
Implantable Lead Implant Date: 20111111
Implantable Lead Implant Date: 20111111
Implantable Lead Location: 753859
Implantable Lead Location: 753860
Implantable Lead Model: 5076
Implantable Lead Model: 6947
Implantable Pulse Generator Implant Date: 20200506
Lead Channel Impedance Value: 399 Ohm
Lead Channel Impedance Value: 456 Ohm
Lead Channel Impedance Value: 494 Ohm
Lead Channel Pacing Threshold Amplitude: 0.625 V
Lead Channel Pacing Threshold Amplitude: 0.75 V
Lead Channel Pacing Threshold Pulse Width: 0.4 ms
Lead Channel Pacing Threshold Pulse Width: 0.4 ms
Lead Channel Sensing Intrinsic Amplitude: 15.375 mV
Lead Channel Sensing Intrinsic Amplitude: 15.375 mV
Lead Channel Sensing Intrinsic Amplitude: 3.375 mV
Lead Channel Sensing Intrinsic Amplitude: 3.375 mV
Lead Channel Setting Pacing Amplitude: 2 V
Lead Channel Setting Pacing Amplitude: 2.5 V
Lead Channel Setting Pacing Pulse Width: 0.4 ms
Lead Channel Setting Sensing Sensitivity: 0.3 mV

## 2019-12-22 NOTE — Progress Notes (Signed)
ICD Remote  

## 2020-03-15 DIAGNOSIS — Z95810 Presence of automatic (implantable) cardiac defibrillator: Secondary | ICD-10-CM | POA: Diagnosis not present

## 2020-03-15 DIAGNOSIS — Z6826 Body mass index (BMI) 26.0-26.9, adult: Secondary | ICD-10-CM | POA: Diagnosis not present

## 2020-03-15 DIAGNOSIS — I429 Cardiomyopathy, unspecified: Secondary | ICD-10-CM | POA: Diagnosis not present

## 2020-03-15 DIAGNOSIS — E785 Hyperlipidemia, unspecified: Secondary | ICD-10-CM | POA: Diagnosis not present

## 2020-03-15 DIAGNOSIS — I10 Essential (primary) hypertension: Secondary | ICD-10-CM | POA: Diagnosis not present

## 2020-03-15 DIAGNOSIS — E119 Type 2 diabetes mellitus without complications: Secondary | ICD-10-CM | POA: Diagnosis not present

## 2020-03-15 DIAGNOSIS — F339 Major depressive disorder, recurrent, unspecified: Secondary | ICD-10-CM | POA: Diagnosis not present

## 2020-03-15 DIAGNOSIS — E1169 Type 2 diabetes mellitus with other specified complication: Secondary | ICD-10-CM | POA: Diagnosis not present

## 2020-03-22 ENCOUNTER — Telehealth: Payer: Self-pay | Admitting: Internal Medicine

## 2020-03-22 ENCOUNTER — Ambulatory Visit (INDEPENDENT_AMBULATORY_CARE_PROVIDER_SITE_OTHER): Payer: Medicare HMO | Admitting: *Deleted

## 2020-03-22 DIAGNOSIS — I255 Ischemic cardiomyopathy: Secondary | ICD-10-CM

## 2020-03-22 NOTE — Telephone Encounter (Signed)
Advised pt transmission has been received, no concerns with results

## 2020-03-22 NOTE — Telephone Encounter (Signed)
  1. Has your device fired? No   2. Is you device beeping? No   3. Are you experiencing draining or swelling at device site? No   4. Are you calling to see if we received your device transmission? Yes  5. Have you passed out? No     Please route to Device Clinic Pool  

## 2020-03-22 NOTE — Telephone Encounter (Signed)
No answer/mailbox not set up

## 2020-03-24 LAB — CUP PACEART REMOTE DEVICE CHECK
Battery Remaining Longevity: 118 mo
Battery Voltage: 3 V
Brady Statistic AP VP Percent: 0.02 %
Brady Statistic AP VS Percent: 28.12 %
Brady Statistic AS VP Percent: 0.02 %
Brady Statistic AS VS Percent: 71.84 %
Brady Statistic RA Percent Paced: 28.13 %
Brady Statistic RV Percent Paced: 0.04 %
Date Time Interrogation Session: 20210716125413
HighPow Impedance: 59 Ohm
HighPow Impedance: 78 Ohm
Implantable Lead Implant Date: 20111111
Implantable Lead Implant Date: 20111111
Implantable Lead Location: 753859
Implantable Lead Location: 753860
Implantable Lead Model: 5076
Implantable Lead Model: 6947
Implantable Pulse Generator Implant Date: 20200506
Lead Channel Impedance Value: 437 Ohm
Lead Channel Impedance Value: 494 Ohm
Lead Channel Impedance Value: 513 Ohm
Lead Channel Pacing Threshold Amplitude: 0.625 V
Lead Channel Pacing Threshold Amplitude: 0.75 V
Lead Channel Pacing Threshold Pulse Width: 0.4 ms
Lead Channel Pacing Threshold Pulse Width: 0.4 ms
Lead Channel Sensing Intrinsic Amplitude: 15.75 mV
Lead Channel Sensing Intrinsic Amplitude: 15.75 mV
Lead Channel Sensing Intrinsic Amplitude: 3.125 mV
Lead Channel Sensing Intrinsic Amplitude: 3.125 mV
Lead Channel Setting Pacing Amplitude: 2 V
Lead Channel Setting Pacing Amplitude: 2.5 V
Lead Channel Setting Pacing Pulse Width: 0.4 ms
Lead Channel Setting Sensing Sensitivity: 0.3 mV

## 2020-03-26 NOTE — Progress Notes (Signed)
Remote ICD transmission.   

## 2020-04-16 ENCOUNTER — Encounter: Payer: Medicare HMO | Admitting: Physician Assistant

## 2020-04-17 DIAGNOSIS — Z713 Dietary counseling and surveillance: Secondary | ICD-10-CM | POA: Diagnosis not present

## 2020-04-17 DIAGNOSIS — E119 Type 2 diabetes mellitus without complications: Secondary | ICD-10-CM | POA: Diagnosis not present

## 2020-05-01 ENCOUNTER — Encounter: Payer: Self-pay | Admitting: Student

## 2020-05-01 ENCOUNTER — Other Ambulatory Visit: Payer: Self-pay

## 2020-05-01 ENCOUNTER — Ambulatory Visit: Payer: Medicare HMO | Admitting: Student

## 2020-05-01 VITALS — BP 122/70 | HR 65 | Ht 68.0 in | Wt 185.0 lb

## 2020-05-01 DIAGNOSIS — I10 Essential (primary) hypertension: Secondary | ICD-10-CM

## 2020-05-01 DIAGNOSIS — I5022 Chronic systolic (congestive) heart failure: Secondary | ICD-10-CM | POA: Diagnosis not present

## 2020-05-01 DIAGNOSIS — I25118 Atherosclerotic heart disease of native coronary artery with other forms of angina pectoris: Secondary | ICD-10-CM | POA: Diagnosis not present

## 2020-05-01 DIAGNOSIS — I255 Ischemic cardiomyopathy: Secondary | ICD-10-CM | POA: Diagnosis not present

## 2020-05-01 LAB — CUP PACEART INCLINIC DEVICE CHECK
Battery Remaining Longevity: 116 mo
Battery Voltage: 2.93 V
Brady Statistic AP VP Percent: 0.01 %
Brady Statistic AP VS Percent: 15.09 %
Brady Statistic AS VP Percent: 0.03 %
Brady Statistic AS VS Percent: 84.87 %
Brady Statistic RA Percent Paced: 15.1 %
Brady Statistic RV Percent Paced: 0.04 %
Date Time Interrogation Session: 20210825131623
HighPow Impedance: 53 Ohm
HighPow Impedance: 75 Ohm
Implantable Lead Implant Date: 20111111
Implantable Lead Implant Date: 20111111
Implantable Lead Location: 753859
Implantable Lead Location: 753860
Implantable Lead Model: 5076
Implantable Lead Model: 6947
Implantable Pulse Generator Implant Date: 20200506
Lead Channel Impedance Value: 437 Ohm
Lead Channel Impedance Value: 494 Ohm
Lead Channel Impedance Value: 513 Ohm
Lead Channel Pacing Threshold Amplitude: 0.625 V
Lead Channel Pacing Threshold Amplitude: 0.625 V
Lead Channel Pacing Threshold Pulse Width: 0.4 ms
Lead Channel Pacing Threshold Pulse Width: 0.4 ms
Lead Channel Sensing Intrinsic Amplitude: 14.875 mV
Lead Channel Sensing Intrinsic Amplitude: 15.5 mV
Lead Channel Sensing Intrinsic Amplitude: 3.25 mV
Lead Channel Sensing Intrinsic Amplitude: 3.875 mV
Lead Channel Setting Pacing Amplitude: 2 V
Lead Channel Setting Pacing Amplitude: 2.5 V
Lead Channel Setting Pacing Pulse Width: 0.4 ms
Lead Channel Setting Sensing Sensitivity: 0.3 mV

## 2020-05-01 NOTE — Progress Notes (Signed)
Electrophysiology Office Note Date: 05/01/2020  ID:  Zachary Berry, DOB 1955/10/11, MRN [id number]  PCP: System, Pcp Not In Primary Cardiologist: Lance Muss, MD Electrophysiologist: Hillis Range, MD   CC: Routine ICD follow-up  Zachary Berry is a 64 y.o. male seen today for Hillis Range, MD for routine electrophysiology followup.  Since last being seen in our clinic the patient reports doing very well.  he denies chest pain, palpitations, dyspnea, PND, orthopnea, nausea, vomiting, dizziness, syncope, edema, weight gain, or early satiety. He has not had ICD shocks.   Device History: MDT dual chamber ICD implanted 2011 for cardiomyopathy History of appropriate therapy: No History of AAD therapy: No  Past Medical History:  Diagnosis Date  . CAD (coronary artery disease)    a. s/p Ant STEMI >> LHC (04/03/2009): EF 30%, proximal LAD occluded >>> PCI: Xience DES to prox LAD.;  b.  LHC (9/15): Proximal LAD stent patent  . Chronic systolic heart failure (HCC)   . Depression   . Diabetes mellitus   . HTN (hypertension)   . Hyperlipidemia   . Ischemic cardiomyopathy    a.  Echo (12/2012): EF 30-35%, anteroseptal hypokinesis, mild LAE;   b.  Cardiac MRI (06/2010): Full thickness scar of the dist ant wall, septum, apex with 2/3 thickness scar of mid and basal ant wall, EF 34%.  . Learning disability   . Poor vision   . S/P implantation of automatic cardioverter/defibrillator (AICD)    Past Surgical History:  Procedure Laterality Date  . HAND SURGERY    . ICD GENERATOR CHANGEOUT N/A 01/11/2019   Procedure: ICD GENERATOR CHANGEOUT;  Surgeon: Hillis Range, MD;  [facility] INVASIVE CV LAB;  Service: Cardiovascular;  Laterality: N/A;  . ICD Implant  07/18/10   MDT ICD implanted by Dr Johney Frame  . LEFT HEART CATHETERIZATION WITH CORONARY ANGIOGRAM N/A 05/25/2014   Procedure: LEFT HEART CATHETERIZATION WITH CORONARY ANGIOGRAM;  Surgeon: Thurmon Fair, MD;  Location: [facility] CATH  LAB;  Service: Cardiovascular;  Laterality: N/A;  . PCI LAD  7/10    Current Outpatient Medications  Medication Sig Dispense Refill  . aspirin 325 MG tablet Take 325 mg by mouth at bedtime.    . clopidogrel (PLAVIX) 75 MG tablet Take 1 tablet (75 mg total) by mouth daily. 90 tablet 1  . Dulaglutide (TRULICITY) 0.75 MG/0.5ML SOPN Inject 0.75 mg into the skin every Monday.    . fenofibrate (TRICOR) 145 MG tablet Take 145 mg by mouth every evening.     . furosemide (LASIX) 40 MG tablet TAKE 1 TABLET BY MOUTH ONCE DAILY. 90 tablet 1  . glipiZIDE (GLUCOTROL) 10 MG tablet Take 10 mg by mouth 2 (two) times daily before a meal.      . insulin NPH-regular Human (70-30) 100 UNIT/ML injection Inject 15-25 Units into the skin See admin instructions. 25 units in the morning & 15 unit in the afternoon    . metFORMIN (GLUCOPHAGE) 1000 MG tablet Take 1,000 mg by mouth 2 (two) times daily.     . nitroGLYCERIN (NITROSTAT) 0.4 MG SL tablet Place 1 tablet (0.4 mg total) under the tongue every 5 (five) minutes as needed for chest pain. 25 tablet 3  . rosuvastatin (CRESTOR) 40 MG tablet Take 1 tablet by mouth daily.    . sertraline (ZOLOFT) 100 MG tablet Take 100 mg by mouth 2 (two) times daily.      No current facility-administered medications for this visit.    Allergies:  Patient has no known allergies.   Social History: Social History   Socioeconomic History  . Marital status: Married    Spouse name: Not on file  . Number of children: Not on file  . Years of education: Not on file  . Highest education level: Not on file  Occupational History  . Occupation: Disabled    Comment: due to CAD  Tobacco Use  . Smoking status: Never Smoker  . Smokeless tobacco: Never Used  . Tobacco comment: former passive smoker  Vaping Use  . Vaping Use: Never used  Substance and Sexual Activity  . Alcohol use: No  . Drug use: No  . Sexual activity: Not on file  Other Topics Concern  . Not on file  Social  History Narrative  . Not on file   Social Determinants of Health   Financial Resource Strain:   . Difficulty of Paying Living Expenses: Not on file  Food Insecurity:   . Worried About Programme researcher, broadcasting/film/video in the Last Year: Not on file  . Ran Out of Food in the Last Year: Not on file  Transportation Needs:   . Lack of Transportation (Medical): Not on file  . Lack of Transportation (Non-Medical): Not on file  Physical Activity:   . Days of Exercise per Week: Not on file  . Minutes of Exercise per Session: Not on file  Stress:   . Feeling of Stress : Not on file  Social Connections:   . Frequency of Communication with Friends and Family: Not on file  . Frequency of Social Gatherings with Friends and Family: Not on file  . Attends Religious Services: Not on file  . Active Member of Clubs or Organizations: Not on file  . Attends Banker Meetings: Not on file  . Marital Status: Not on file  Intimate Partner Violence:   . Fear of Current or Ex-Partner: Not on file  . Emotionally Abused: Not on file  . Physically Abused: Not on file  . Sexually Abused: Not on file    Family History: Family History  Problem Relation Age of Onset  . Heart disease Mother   . Heart failure Mother   . Diabetes Mother   . Stroke Mother   . Diabetes Father   . Hypertension Father   . Heart attack Brother 45  . Stomach cancer Other        uncle  . Diabetes Sister   . Diabetes Brother   . Sudden death Brother     Review of Systems: All other systems reviewed and are otherwise negative except as noted above.   Physical Exam: Vitals:   05/01/20 1212  BP: 122/70  Pulse: 65  SpO2: 97%  Weight: 185 lb (83.9 kg)  Height: 5\' 8"  (1.727 m)     GEN- The patient is well appearing, alert and oriented x 3 today.   HEENT: normocephalic, atraumatic; sclera clear, conjunctiva pink; hearing intact; oropharynx clear; neck supple, no JVP Lymph- no cervical lymphadenopathy Lungs- Clear to  ausculation bilaterally, normal work of breathing.  No wheezes, rales, rhonchi Heart- Regular rate and rhythm, no murmurs, rubs or gallops, PMI not laterally displaced GI- soft, non-tender, non-distended, bowel sounds present, no hepatosplenomegaly Extremities- no clubbing or cyanosis. No edema; DP/PT/radial pulses 2+ bilaterally MS- no significant deformity or atrophy Skin- warm and dry, no rash or lesion; ICD pocket well healed Psych- euthymic mood, full affect Neuro- strength and sensation are intact  ICD interrogation- reviewed in detail  today,  See PACEART report  EKG:  EKG is not ordered today.  Recent Labs: No results found for requested labs within last 8760 hours.   Wt Readings from Last 3 Encounters:  05/01/20 185 lb (83.9 kg)  12/12/19 181 lb 6.4 oz (82.3 kg)  04/17/19 179 lb (81.2 kg)     Other studies Reviewed: Additional studies/ records that were reviewed today include: Previous EP office notes   Assessment and Plan:  1.  Chronic systolic dysfunction s/p Medtronic dual chamber ICD  euvolemic today Stable on an appropriate medical regimen Normal ICD function See Pace Art report No changes today  2.  CAD  No recent ischemic symptoms Continue medical therapy  3.  HTN Stable No change required today  Current medicines are reviewed at length with the patient today.   The patient does not have concerns regarding his medicines.  The following changes were made today:  none  Labs/ tests ordered today include:  No orders of the defined types were placed in this encounter.    Disposition:   Follow up with EP APP  12 months   Signed, Graciella Freer, PA-C  05/01/2020 12:19 PM  [facility] [address] [place] [address] [phone] (office) [phone] (fax)

## 2020-05-01 NOTE — Patient Instructions (Signed)
Medication Instructions:  *If you need a refill on your cardiac medications before your next appointment, please call your pharmacy*  Follow-Up: At [facility], you and your health needs are our priority.  As part of our continuing mission to provide you with exceptional heart care, we have created designated Provider Care Teams.  These Care Teams include your primary Cardiologist (physician) and Advanced Practice Providers (APPs -  Physician Assistants and Nurse Practitioners) who all work together to provide you with the care you need, when you need it.  We recommend signing up for the patient portal called "MyChart".  Sign up information is provided on this After Visit Summary.  MyChart is used to connect with patients for Virtual Visits (Telemedicine).  Patients are able to view lab/test results, encounter notes, upcoming appointments, etc.  Non-urgent messages can be sent to your provider as well.   To learn more about what you can do with MyChart, go to [email or web address].    Your next appointment:   Your physician wants you to follow-up in: 1 YEAR. You will receive a reminder letter in the mail two months in advance. If you don't receive a letter, please call our office to schedule the follow-up appointment.   The format for your next appointment:   In Person with You may see Hillis Range, MD or one of the following Advanced Practice Providers on your designated Care Team:    Gypsy Balsam, NP  Francis Dowse, PA-C  Casimiro Needle "[place]" [place], [place]

## 2020-05-07 ENCOUNTER — Telehealth: Payer: Self-pay

## 2020-05-07 NOTE — Telephone Encounter (Signed)
Called pt's PCP office to have patent's most recent labs faxed over Otilio Saber, PA-C to review.  Stacy, RN told me that labs have been ordered but have not been received or resulted yet.  Requested a note be put on labs to have them faxed to medical records upon completion.

## 2020-07-08 DIAGNOSIS — I2510 Atherosclerotic heart disease of native coronary artery without angina pectoris: Secondary | ICD-10-CM | POA: Diagnosis not present

## 2020-07-08 DIAGNOSIS — Z23 Encounter for immunization: Secondary | ICD-10-CM | POA: Diagnosis not present

## 2020-07-08 DIAGNOSIS — I429 Cardiomyopathy, unspecified: Secondary | ICD-10-CM | POA: Diagnosis not present

## 2020-07-08 DIAGNOSIS — Z6828 Body mass index (BMI) 28.0-28.9, adult: Secondary | ICD-10-CM | POA: Diagnosis not present

## 2020-07-08 DIAGNOSIS — F339 Major depressive disorder, recurrent, unspecified: Secondary | ICD-10-CM | POA: Diagnosis not present

## 2020-07-08 DIAGNOSIS — E785 Hyperlipidemia, unspecified: Secondary | ICD-10-CM | POA: Diagnosis not present

## 2020-07-08 DIAGNOSIS — E1169 Type 2 diabetes mellitus with other specified complication: Secondary | ICD-10-CM | POA: Diagnosis not present

## 2020-07-08 DIAGNOSIS — E1165 Type 2 diabetes mellitus with hyperglycemia: Secondary | ICD-10-CM | POA: Diagnosis not present

## 2020-08-28 DIAGNOSIS — Z23 Encounter for immunization: Secondary | ICD-10-CM | POA: Diagnosis not present

## 2020-09-20 ENCOUNTER — Ambulatory Visit (INDEPENDENT_AMBULATORY_CARE_PROVIDER_SITE_OTHER): Payer: Medicare HMO

## 2020-09-20 DIAGNOSIS — I5022 Chronic systolic (congestive) heart failure: Secondary | ICD-10-CM | POA: Diagnosis not present

## 2020-09-23 LAB — CUP PACEART REMOTE DEVICE CHECK
Battery Remaining Longevity: 111 mo
Battery Voltage: 3.01 V
Brady Statistic AP VP Percent: 0.02 %
Brady Statistic AP VS Percent: 36.83 %
Brady Statistic AS VP Percent: 0.02 %
Brady Statistic AS VS Percent: 63.13 %
Brady Statistic RA Percent Paced: 36.83 %
Brady Statistic RV Percent Paced: 0.04 %
Date Time Interrogation Session: 20220114043722
HighPow Impedance: 53 Ohm
HighPow Impedance: 67 Ohm
Implantable Lead Implant Date: 20111111
Implantable Lead Implant Date: 20111111
Implantable Lead Location: 753859
Implantable Lead Location: 753860
Implantable Lead Model: 5076
Implantable Lead Model: 6947
Implantable Pulse Generator Implant Date: 20200506
Lead Channel Impedance Value: 399 Ohm
Lead Channel Impedance Value: 494 Ohm
Lead Channel Impedance Value: 513 Ohm
Lead Channel Pacing Threshold Amplitude: 0.625 V
Lead Channel Pacing Threshold Amplitude: 0.75 V
Lead Channel Pacing Threshold Pulse Width: 0.4 ms
Lead Channel Pacing Threshold Pulse Width: 0.4 ms
Lead Channel Sensing Intrinsic Amplitude: 15.625 mV
Lead Channel Sensing Intrinsic Amplitude: 15.625 mV
Lead Channel Sensing Intrinsic Amplitude: 2.875 mV
Lead Channel Sensing Intrinsic Amplitude: 2.875 mV
Lead Channel Setting Pacing Amplitude: 2 V
Lead Channel Setting Pacing Amplitude: 2.5 V
Lead Channel Setting Pacing Pulse Width: 0.4 ms
Lead Channel Setting Sensing Sensitivity: 0.3 mV

## 2020-09-24 DIAGNOSIS — E785 Hyperlipidemia, unspecified: Secondary | ICD-10-CM | POA: Diagnosis not present

## 2020-09-24 DIAGNOSIS — Z1331 Encounter for screening for depression: Secondary | ICD-10-CM | POA: Diagnosis not present

## 2020-09-24 DIAGNOSIS — Z9181 History of falling: Secondary | ICD-10-CM | POA: Diagnosis not present

## 2020-09-24 DIAGNOSIS — Z0000 Encounter for general adult medical examination without abnormal findings: Secondary | ICD-10-CM | POA: Diagnosis not present

## 2020-10-04 NOTE — Progress Notes (Signed)
Remote ICD transmission.   

## 2020-10-10 DIAGNOSIS — E785 Hyperlipidemia, unspecified: Secondary | ICD-10-CM | POA: Diagnosis not present

## 2020-10-10 DIAGNOSIS — E1169 Type 2 diabetes mellitus with other specified complication: Secondary | ICD-10-CM | POA: Diagnosis not present

## 2020-10-10 DIAGNOSIS — I429 Cardiomyopathy, unspecified: Secondary | ICD-10-CM | POA: Diagnosis not present

## 2020-10-10 DIAGNOSIS — I2510 Atherosclerotic heart disease of native coronary artery without angina pectoris: Secondary | ICD-10-CM | POA: Diagnosis not present

## 2020-10-10 DIAGNOSIS — Z6827 Body mass index (BMI) 27.0-27.9, adult: Secondary | ICD-10-CM | POA: Diagnosis not present

## 2020-10-10 DIAGNOSIS — I10 Essential (primary) hypertension: Secondary | ICD-10-CM | POA: Diagnosis not present

## 2020-10-10 DIAGNOSIS — F339 Major depressive disorder, recurrent, unspecified: Secondary | ICD-10-CM | POA: Diagnosis not present

## 2020-10-10 DIAGNOSIS — E1165 Type 2 diabetes mellitus with hyperglycemia: Secondary | ICD-10-CM | POA: Diagnosis not present

## 2020-10-23 DIAGNOSIS — E1165 Type 2 diabetes mellitus with hyperglycemia: Secondary | ICD-10-CM | POA: Diagnosis not present

## 2020-12-20 ENCOUNTER — Ambulatory Visit (INDEPENDENT_AMBULATORY_CARE_PROVIDER_SITE_OTHER): Payer: Medicare HMO

## 2020-12-20 DIAGNOSIS — I255 Ischemic cardiomyopathy: Secondary | ICD-10-CM | POA: Diagnosis not present

## 2020-12-24 LAB — CUP PACEART REMOTE DEVICE CHECK
Battery Remaining Longevity: 108 mo
Battery Voltage: 3 V
Brady Statistic AP VP Percent: 0.02 %
Brady Statistic AP VS Percent: 19.21 %
Brady Statistic AS VP Percent: 0.03 %
Brady Statistic AS VS Percent: 80.75 %
Brady Statistic RA Percent Paced: 19.22 %
Brady Statistic RV Percent Paced: 0.04 %
Date Time Interrogation Session: 20220415033623
HighPow Impedance: 54 Ohm
HighPow Impedance: 69 Ohm
Implantable Lead Implant Date: 20111111
Implantable Lead Implant Date: 20111111
Implantable Lead Location: 753859
Implantable Lead Location: 753860
Implantable Lead Model: 5076
Implantable Lead Model: 6947
Implantable Pulse Generator Implant Date: 20200506
Lead Channel Impedance Value: 437 Ohm
Lead Channel Impedance Value: 494 Ohm
Lead Channel Impedance Value: 494 Ohm
Lead Channel Pacing Threshold Amplitude: 0.625 V
Lead Channel Pacing Threshold Amplitude: 0.75 V
Lead Channel Pacing Threshold Pulse Width: 0.4 ms
Lead Channel Pacing Threshold Pulse Width: 0.4 ms
Lead Channel Sensing Intrinsic Amplitude: 17.125 mV
Lead Channel Sensing Intrinsic Amplitude: 17.125 mV
Lead Channel Sensing Intrinsic Amplitude: 3.625 mV
Lead Channel Sensing Intrinsic Amplitude: 3.625 mV
Lead Channel Setting Pacing Amplitude: 2 V
Lead Channel Setting Pacing Amplitude: 2.5 V
Lead Channel Setting Pacing Pulse Width: 0.4 ms
Lead Channel Setting Sensing Sensitivity: 0.3 mV

## 2021-01-07 NOTE — Progress Notes (Signed)
Remote ICD transmission.   

## 2021-03-06 DIAGNOSIS — F339 Major depressive disorder, recurrent, unspecified: Secondary | ICD-10-CM | POA: Diagnosis not present

## 2021-03-06 DIAGNOSIS — E1165 Type 2 diabetes mellitus with hyperglycemia: Secondary | ICD-10-CM | POA: Diagnosis not present

## 2021-03-06 DIAGNOSIS — I2510 Atherosclerotic heart disease of native coronary artery without angina pectoris: Secondary | ICD-10-CM | POA: Diagnosis not present

## 2021-03-06 DIAGNOSIS — Z794 Long term (current) use of insulin: Secondary | ICD-10-CM | POA: Diagnosis not present

## 2021-03-06 DIAGNOSIS — I429 Cardiomyopathy, unspecified: Secondary | ICD-10-CM | POA: Diagnosis not present

## 2021-03-06 DIAGNOSIS — I10 Essential (primary) hypertension: Secondary | ICD-10-CM | POA: Diagnosis not present

## 2021-03-06 DIAGNOSIS — E785 Hyperlipidemia, unspecified: Secondary | ICD-10-CM | POA: Diagnosis not present

## 2021-03-06 DIAGNOSIS — Z6827 Body mass index (BMI) 27.0-27.9, adult: Secondary | ICD-10-CM | POA: Diagnosis not present

## 2021-03-06 DIAGNOSIS — E1169 Type 2 diabetes mellitus with other specified complication: Secondary | ICD-10-CM | POA: Diagnosis not present

## 2021-03-06 DIAGNOSIS — Z125 Encounter for screening for malignant neoplasm of prostate: Secondary | ICD-10-CM | POA: Diagnosis not present

## 2021-03-19 DIAGNOSIS — E1165 Type 2 diabetes mellitus with hyperglycemia: Secondary | ICD-10-CM | POA: Diagnosis not present

## 2021-03-21 ENCOUNTER — Ambulatory Visit (INDEPENDENT_AMBULATORY_CARE_PROVIDER_SITE_OTHER): Payer: Medicare HMO

## 2021-03-21 DIAGNOSIS — I255 Ischemic cardiomyopathy: Secondary | ICD-10-CM | POA: Diagnosis not present

## 2021-03-24 LAB — CUP PACEART REMOTE DEVICE CHECK
Battery Remaining Longevity: 104 mo
Battery Voltage: 3 V
Brady Statistic AP VP Percent: 0.02 %
Brady Statistic AP VS Percent: 31.78 %
Brady Statistic AS VP Percent: 0.02 %
Brady Statistic AS VS Percent: 68.17 %
Brady Statistic RA Percent Paced: 31.8 %
Brady Statistic RV Percent Paced: 0.04 %
Date Time Interrogation Session: 20220715044224
HighPow Impedance: 53 Ohm
HighPow Impedance: 69 Ohm
Implantable Lead Implant Date: 20111111
Implantable Lead Implant Date: 20111111
Implantable Lead Location: 753859
Implantable Lead Location: 753860
Implantable Lead Model: 5076
Implantable Lead Model: 6947
Implantable Pulse Generator Implant Date: 20200506
Lead Channel Impedance Value: 399 Ohm
Lead Channel Impedance Value: 494 Ohm
Lead Channel Impedance Value: 494 Ohm
Lead Channel Pacing Threshold Amplitude: 0.5 V
Lead Channel Pacing Threshold Amplitude: 0.75 V
Lead Channel Pacing Threshold Pulse Width: 0.4 ms
Lead Channel Pacing Threshold Pulse Width: 0.4 ms
Lead Channel Sensing Intrinsic Amplitude: 16.125 mV
Lead Channel Sensing Intrinsic Amplitude: 16.125 mV
Lead Channel Sensing Intrinsic Amplitude: 2.875 mV
Lead Channel Sensing Intrinsic Amplitude: 2.875 mV
Lead Channel Setting Pacing Amplitude: 2 V
Lead Channel Setting Pacing Amplitude: 2.5 V
Lead Channel Setting Pacing Pulse Width: 0.4 ms
Lead Channel Setting Sensing Sensitivity: 0.3 mV

## 2021-04-14 NOTE — Progress Notes (Signed)
Remote ICD transmission.   

## 2021-06-10 DIAGNOSIS — I2510 Atherosclerotic heart disease of native coronary artery without angina pectoris: Secondary | ICD-10-CM | POA: Diagnosis not present

## 2021-06-10 DIAGNOSIS — E1169 Type 2 diabetes mellitus with other specified complication: Secondary | ICD-10-CM | POA: Diagnosis not present

## 2021-06-10 DIAGNOSIS — I10 Essential (primary) hypertension: Secondary | ICD-10-CM | POA: Diagnosis not present

## 2021-06-20 ENCOUNTER — Ambulatory Visit (INDEPENDENT_AMBULATORY_CARE_PROVIDER_SITE_OTHER): Payer: Medicare HMO

## 2021-06-20 DIAGNOSIS — I5022 Chronic systolic (congestive) heart failure: Secondary | ICD-10-CM

## 2021-06-20 DIAGNOSIS — I255 Ischemic cardiomyopathy: Secondary | ICD-10-CM

## 2021-06-23 LAB — CUP PACEART REMOTE DEVICE CHECK
Battery Remaining Longevity: 100 mo
Battery Voltage: 3 V
Brady Statistic AP VP Percent: 0.03 %
Brady Statistic AP VS Percent: 41.37 %
Brady Statistic AS VP Percent: 0.01 %
Brady Statistic AS VS Percent: 58.59 %
Brady Statistic RA Percent Paced: 41.39 %
Brady Statistic RV Percent Paced: 0.04 %
Date Time Interrogation Session: 20221014012405
HighPow Impedance: 48 Ohm
HighPow Impedance: 64 Ohm
Implantable Lead Implant Date: 20111111
Implantable Lead Implant Date: 20111111
Implantable Lead Location: 753859
Implantable Lead Location: 753860
Implantable Lead Model: 5076
Implantable Lead Model: 6947
Implantable Pulse Generator Implant Date: 20200506
Lead Channel Impedance Value: 399 Ohm
Lead Channel Impedance Value: 456 Ohm
Lead Channel Impedance Value: 494 Ohm
Lead Channel Pacing Threshold Amplitude: 0.625 V
Lead Channel Pacing Threshold Amplitude: 0.75 V
Lead Channel Pacing Threshold Pulse Width: 0.4 ms
Lead Channel Pacing Threshold Pulse Width: 0.4 ms
Lead Channel Sensing Intrinsic Amplitude: 14.625 mV
Lead Channel Sensing Intrinsic Amplitude: 14.625 mV
Lead Channel Sensing Intrinsic Amplitude: 2.75 mV
Lead Channel Sensing Intrinsic Amplitude: 2.75 mV
Lead Channel Setting Pacing Amplitude: 2 V
Lead Channel Setting Pacing Amplitude: 2.5 V
Lead Channel Setting Pacing Pulse Width: 0.4 ms
Lead Channel Setting Sensing Sensitivity: 0.3 mV

## 2021-06-30 NOTE — Progress Notes (Signed)
Remote ICD transmission.   

## 2021-07-10 DIAGNOSIS — E785 Hyperlipidemia, unspecified: Secondary | ICD-10-CM | POA: Diagnosis not present

## 2021-07-10 DIAGNOSIS — I10 Essential (primary) hypertension: Secondary | ICD-10-CM | POA: Diagnosis not present

## 2021-07-10 DIAGNOSIS — Z23 Encounter for immunization: Secondary | ICD-10-CM | POA: Diagnosis not present

## 2021-07-10 DIAGNOSIS — F339 Major depressive disorder, recurrent, unspecified: Secondary | ICD-10-CM | POA: Diagnosis not present

## 2021-07-10 DIAGNOSIS — I2510 Atherosclerotic heart disease of native coronary artery without angina pectoris: Secondary | ICD-10-CM | POA: Diagnosis not present

## 2021-07-10 DIAGNOSIS — I429 Cardiomyopathy, unspecified: Secondary | ICD-10-CM | POA: Diagnosis not present

## 2021-07-10 DIAGNOSIS — E1165 Type 2 diabetes mellitus with hyperglycemia: Secondary | ICD-10-CM | POA: Diagnosis not present

## 2021-07-10 DIAGNOSIS — E1169 Type 2 diabetes mellitus with other specified complication: Secondary | ICD-10-CM | POA: Diagnosis not present

## 2021-09-05 DIAGNOSIS — I10 Essential (primary) hypertension: Secondary | ICD-10-CM | POA: Diagnosis not present

## 2021-09-05 DIAGNOSIS — E1169 Type 2 diabetes mellitus with other specified complication: Secondary | ICD-10-CM | POA: Diagnosis not present

## 2021-09-05 DIAGNOSIS — I2510 Atherosclerotic heart disease of native coronary artery without angina pectoris: Secondary | ICD-10-CM | POA: Diagnosis not present

## 2021-09-19 ENCOUNTER — Ambulatory Visit (INDEPENDENT_AMBULATORY_CARE_PROVIDER_SITE_OTHER): Payer: Medicare HMO

## 2021-09-19 DIAGNOSIS — I255 Ischemic cardiomyopathy: Secondary | ICD-10-CM | POA: Diagnosis not present

## 2021-09-19 LAB — CUP PACEART REMOTE DEVICE CHECK
Battery Remaining Longevity: 95 mo
Battery Voltage: 3 V
Brady Statistic AP VP Percent: 0.03 %
Brady Statistic AP VS Percent: 39.24 %
Brady Statistic AS VP Percent: 0.02 %
Brady Statistic AS VS Percent: 60.72 %
Brady Statistic RA Percent Paced: 39.25 %
Brady Statistic RV Percent Paced: 0.05 %
Date Time Interrogation Session: 20230113033421
HighPow Impedance: 54 Ohm
HighPow Impedance: 70 Ohm
Implantable Lead Implant Date: 20111111
Implantable Lead Implant Date: 20111111
Implantable Lead Location: 753859
Implantable Lead Location: 753860
Implantable Lead Model: 5076
Implantable Lead Model: 6947
Implantable Pulse Generator Implant Date: 20200506
Lead Channel Impedance Value: 399 Ohm
Lead Channel Impedance Value: 456 Ohm
Lead Channel Impedance Value: 494 Ohm
Lead Channel Pacing Threshold Amplitude: 0.625 V
Lead Channel Pacing Threshold Amplitude: 0.75 V
Lead Channel Pacing Threshold Pulse Width: 0.4 ms
Lead Channel Pacing Threshold Pulse Width: 0.4 ms
Lead Channel Sensing Intrinsic Amplitude: 15.5 mV
Lead Channel Sensing Intrinsic Amplitude: 15.5 mV
Lead Channel Sensing Intrinsic Amplitude: 3.125 mV
Lead Channel Sensing Intrinsic Amplitude: 3.125 mV
Lead Channel Setting Pacing Amplitude: 2 V
Lead Channel Setting Pacing Amplitude: 2.5 V
Lead Channel Setting Pacing Pulse Width: 0.4 ms
Lead Channel Setting Sensing Sensitivity: 0.3 mV

## 2021-09-30 NOTE — Progress Notes (Signed)
Remote ICD transmission.   

## 2021-11-04 DIAGNOSIS — E1169 Type 2 diabetes mellitus with other specified complication: Secondary | ICD-10-CM | POA: Diagnosis not present

## 2021-11-04 DIAGNOSIS — I2510 Atherosclerotic heart disease of native coronary artery without angina pectoris: Secondary | ICD-10-CM | POA: Diagnosis not present

## 2021-11-04 DIAGNOSIS — I10 Essential (primary) hypertension: Secondary | ICD-10-CM | POA: Diagnosis not present

## 2021-11-18 DIAGNOSIS — E785 Hyperlipidemia, unspecified: Secondary | ICD-10-CM | POA: Diagnosis not present

## 2021-11-18 DIAGNOSIS — Z6829 Body mass index (BMI) 29.0-29.9, adult: Secondary | ICD-10-CM | POA: Diagnosis not present

## 2021-11-18 DIAGNOSIS — I10 Essential (primary) hypertension: Secondary | ICD-10-CM | POA: Diagnosis not present

## 2021-11-18 DIAGNOSIS — E1165 Type 2 diabetes mellitus with hyperglycemia: Secondary | ICD-10-CM | POA: Diagnosis not present

## 2021-12-19 ENCOUNTER — Ambulatory Visit (INDEPENDENT_AMBULATORY_CARE_PROVIDER_SITE_OTHER): Payer: Medicare HMO

## 2021-12-19 DIAGNOSIS — I255 Ischemic cardiomyopathy: Secondary | ICD-10-CM

## 2021-12-19 DIAGNOSIS — I5022 Chronic systolic (congestive) heart failure: Secondary | ICD-10-CM

## 2021-12-19 LAB — CUP PACEART REMOTE DEVICE CHECK
Battery Remaining Longevity: 90 mo
Battery Voltage: 3 V
Brady Statistic AP VP Percent: 0.02 %
Brady Statistic AP VS Percent: 31.04 %
Brady Statistic AS VP Percent: 0.02 %
Brady Statistic AS VS Percent: 68.91 %
Brady Statistic RA Percent Paced: 30.98 %
Brady Statistic RV Percent Paced: 0.05 %
Date Time Interrogation Session: 20230414001603
HighPow Impedance: 55 Ohm
HighPow Impedance: 75 Ohm
Implantable Lead Implant Date: 20111111
Implantable Lead Implant Date: 20111111
Implantable Lead Location: 753859
Implantable Lead Location: 753860
Implantable Lead Model: 5076
Implantable Lead Model: 6947
Implantable Pulse Generator Implant Date: 20200506
Lead Channel Impedance Value: 437 Ohm
Lead Channel Impedance Value: 494 Ohm
Lead Channel Impedance Value: 513 Ohm
Lead Channel Pacing Threshold Amplitude: 0.5 V
Lead Channel Pacing Threshold Amplitude: 0.75 V
Lead Channel Pacing Threshold Pulse Width: 0.4 ms
Lead Channel Pacing Threshold Pulse Width: 0.4 ms
Lead Channel Sensing Intrinsic Amplitude: 14.25 mV
Lead Channel Sensing Intrinsic Amplitude: 14.25 mV
Lead Channel Sensing Intrinsic Amplitude: 3.25 mV
Lead Channel Sensing Intrinsic Amplitude: 3.25 mV
Lead Channel Setting Pacing Amplitude: 2 V
Lead Channel Setting Pacing Amplitude: 2.5 V
Lead Channel Setting Pacing Pulse Width: 0.4 ms
Lead Channel Setting Sensing Sensitivity: 0.3 mV

## 2022-01-04 DIAGNOSIS — E1169 Type 2 diabetes mellitus with other specified complication: Secondary | ICD-10-CM | POA: Diagnosis not present

## 2022-01-04 DIAGNOSIS — I10 Essential (primary) hypertension: Secondary | ICD-10-CM | POA: Diagnosis not present

## 2022-01-04 DIAGNOSIS — I2510 Atherosclerotic heart disease of native coronary artery without angina pectoris: Secondary | ICD-10-CM | POA: Diagnosis not present

## 2022-01-06 NOTE — Progress Notes (Signed)
Remote ICD transmission.   

## 2022-03-20 ENCOUNTER — Ambulatory Visit (INDEPENDENT_AMBULATORY_CARE_PROVIDER_SITE_OTHER): Payer: Medicare HMO

## 2022-03-20 DIAGNOSIS — I255 Ischemic cardiomyopathy: Secondary | ICD-10-CM | POA: Diagnosis not present

## 2022-03-23 LAB — CUP PACEART REMOTE DEVICE CHECK
Battery Remaining Longevity: 86 mo
Battery Voltage: 2.99 V
Brady Statistic AP VP Percent: 0.02 %
Brady Statistic AP VS Percent: 28.73 %
Brady Statistic AS VP Percent: 0.02 %
Brady Statistic AS VS Percent: 71.23 %
Brady Statistic RA Percent Paced: 28.66 %
Brady Statistic RV Percent Paced: 0.04 %
Date Time Interrogation Session: 20230714223630
HighPow Impedance: 53 Ohm
HighPow Impedance: 69 Ohm
Implantable Lead Implant Date: 20111111
Implantable Lead Implant Date: 20111111
Implantable Lead Location: 753859
Implantable Lead Location: 753860
Implantable Lead Model: 5076
Implantable Lead Model: 6947
Implantable Pulse Generator Implant Date: 20200506
Lead Channel Impedance Value: 437 Ohm
Lead Channel Impedance Value: 456 Ohm
Lead Channel Impedance Value: 494 Ohm
Lead Channel Pacing Threshold Amplitude: 0.5 V
Lead Channel Pacing Threshold Amplitude: 0.75 V
Lead Channel Pacing Threshold Pulse Width: 0.4 ms
Lead Channel Pacing Threshold Pulse Width: 0.4 ms
Lead Channel Sensing Intrinsic Amplitude: 15.625 mV
Lead Channel Sensing Intrinsic Amplitude: 15.625 mV
Lead Channel Sensing Intrinsic Amplitude: 3 mV
Lead Channel Sensing Intrinsic Amplitude: 3 mV
Lead Channel Setting Pacing Amplitude: 2 V
Lead Channel Setting Pacing Amplitude: 2.5 V
Lead Channel Setting Pacing Pulse Width: 0.4 ms
Lead Channel Setting Sensing Sensitivity: 0.3 mV

## 2022-03-25 DIAGNOSIS — E1165 Type 2 diabetes mellitus with hyperglycemia: Secondary | ICD-10-CM | POA: Diagnosis not present

## 2022-03-25 DIAGNOSIS — Z6829 Body mass index (BMI) 29.0-29.9, adult: Secondary | ICD-10-CM | POA: Diagnosis not present

## 2022-03-25 DIAGNOSIS — Z794 Long term (current) use of insulin: Secondary | ICD-10-CM | POA: Diagnosis not present

## 2022-03-25 DIAGNOSIS — I10 Essential (primary) hypertension: Secondary | ICD-10-CM | POA: Diagnosis not present

## 2022-03-25 DIAGNOSIS — E119 Type 2 diabetes mellitus without complications: Secondary | ICD-10-CM | POA: Diagnosis not present

## 2022-03-25 DIAGNOSIS — I2510 Atherosclerotic heart disease of native coronary artery without angina pectoris: Secondary | ICD-10-CM | POA: Diagnosis not present

## 2022-03-25 DIAGNOSIS — F321 Major depressive disorder, single episode, moderate: Secondary | ICD-10-CM | POA: Diagnosis not present

## 2022-04-02 NOTE — Progress Notes (Signed)
Remote ICD transmission.   

## 2022-06-19 ENCOUNTER — Ambulatory Visit (INDEPENDENT_AMBULATORY_CARE_PROVIDER_SITE_OTHER): Payer: Medicare PPO

## 2022-06-19 DIAGNOSIS — I255 Ischemic cardiomyopathy: Secondary | ICD-10-CM

## 2022-06-21 LAB — CUP PACEART REMOTE DEVICE CHECK
Battery Remaining Longevity: 80 mo
Battery Voltage: 2.99 V
Brady Statistic AP VP Percent: 0.05 %
Brady Statistic AP VS Percent: 47.54 %
Brady Statistic AS VP Percent: 0.01 %
Brady Statistic AS VS Percent: 52.4 %
Brady Statistic RA Percent Paced: 47.19 %
Brady Statistic RV Percent Paced: 0.07 %
Date Time Interrogation Session: 20231014003625
HighPow Impedance: 51 Ohm
HighPow Impedance: 71 Ohm
Implantable Lead Implant Date: 20111111
Implantable Lead Implant Date: 20111111
Implantable Lead Location: 753859
Implantable Lead Location: 753860
Implantable Lead Model: 5076
Implantable Lead Model: 6947
Implantable Pulse Generator Implant Date: 20200506
Lead Channel Impedance Value: 437 Ohm
Lead Channel Impedance Value: 494 Ohm
Lead Channel Impedance Value: 494 Ohm
Lead Channel Pacing Threshold Amplitude: 0.5 V
Lead Channel Pacing Threshold Amplitude: 0.75 V
Lead Channel Pacing Threshold Pulse Width: 0.4 ms
Lead Channel Pacing Threshold Pulse Width: 0.4 ms
Lead Channel Sensing Intrinsic Amplitude: 17 mV
Lead Channel Sensing Intrinsic Amplitude: 17 mV
Lead Channel Sensing Intrinsic Amplitude: 3.25 mV
Lead Channel Sensing Intrinsic Amplitude: 3.25 mV
Lead Channel Setting Pacing Amplitude: 2 V
Lead Channel Setting Pacing Amplitude: 2.5 V
Lead Channel Setting Pacing Pulse Width: 0.4 ms
Lead Channel Setting Sensing Sensitivity: 0.3 mV

## 2022-06-24 NOTE — Progress Notes (Signed)
Remote ICD transmission.   

## 2022-08-17 DIAGNOSIS — Z23 Encounter for immunization: Secondary | ICD-10-CM | POA: Diagnosis not present

## 2022-08-17 DIAGNOSIS — E785 Hyperlipidemia, unspecified: Secondary | ICD-10-CM | POA: Diagnosis not present

## 2022-08-17 DIAGNOSIS — Z125 Encounter for screening for malignant neoplasm of prostate: Secondary | ICD-10-CM | POA: Diagnosis not present

## 2022-08-17 DIAGNOSIS — I10 Essential (primary) hypertension: Secondary | ICD-10-CM | POA: Diagnosis not present

## 2022-08-17 DIAGNOSIS — I2510 Atherosclerotic heart disease of native coronary artery without angina pectoris: Secondary | ICD-10-CM | POA: Diagnosis not present

## 2022-08-17 DIAGNOSIS — E1165 Type 2 diabetes mellitus with hyperglycemia: Secondary | ICD-10-CM | POA: Diagnosis not present

## 2022-09-18 ENCOUNTER — Ambulatory Visit (INDEPENDENT_AMBULATORY_CARE_PROVIDER_SITE_OTHER): Payer: Medicare PPO

## 2022-09-18 DIAGNOSIS — I255 Ischemic cardiomyopathy: Secondary | ICD-10-CM

## 2022-09-18 LAB — CUP PACEART REMOTE DEVICE CHECK
Battery Remaining Longevity: 78 mo
Battery Voltage: 2.99 V
Brady Statistic AP VP Percent: 0.06 %
Brady Statistic AP VS Percent: 54.82 %
Brady Statistic AS VP Percent: 0.01 %
Brady Statistic AS VS Percent: 45.11 %
Brady Statistic RA Percent Paced: 54.59 %
Brady Statistic RV Percent Paced: 0.08 %
Date Time Interrogation Session: 20240112031703
HighPow Impedance: 51 Ohm
HighPow Impedance: 70 Ohm
Implantable Lead Connection Status: 753985
Implantable Lead Connection Status: 753985
Implantable Lead Implant Date: 20111111
Implantable Lead Implant Date: 20111111
Implantable Lead Location: 753859
Implantable Lead Location: 753860
Implantable Lead Model: 5076
Implantable Lead Model: 6947
Implantable Pulse Generator Implant Date: 20200506
Lead Channel Impedance Value: 399 Ohm
Lead Channel Impedance Value: 494 Ohm
Lead Channel Impedance Value: 494 Ohm
Lead Channel Pacing Threshold Amplitude: 0.5 V
Lead Channel Pacing Threshold Amplitude: 0.75 V
Lead Channel Pacing Threshold Pulse Width: 0.4 ms
Lead Channel Pacing Threshold Pulse Width: 0.4 ms
Lead Channel Sensing Intrinsic Amplitude: 15 mV
Lead Channel Sensing Intrinsic Amplitude: 15 mV
Lead Channel Sensing Intrinsic Amplitude: 2.875 mV
Lead Channel Sensing Intrinsic Amplitude: 2.875 mV
Lead Channel Setting Pacing Amplitude: 2 V
Lead Channel Setting Pacing Amplitude: 2.5 V
Lead Channel Setting Pacing Pulse Width: 0.4 ms
Lead Channel Setting Sensing Sensitivity: 0.3 mV
Zone Setting Status: 755011
Zone Setting Status: 755011

## 2022-10-06 NOTE — Progress Notes (Signed)
Remote ICD transmission.   

## 2022-12-18 ENCOUNTER — Ambulatory Visit (INDEPENDENT_AMBULATORY_CARE_PROVIDER_SITE_OTHER): Payer: Medicare PPO

## 2022-12-18 DIAGNOSIS — I255 Ischemic cardiomyopathy: Secondary | ICD-10-CM | POA: Diagnosis not present

## 2022-12-18 LAB — CUP PACEART REMOTE DEVICE CHECK
Battery Remaining Longevity: 74 mo
Battery Voltage: 2.99 V
Brady Statistic AP VP Percent: 0.07 %
Brady Statistic AP VS Percent: 60.28 %
Brady Statistic AS VP Percent: 0.01 %
Brady Statistic AS VS Percent: 39.64 %
Brady Statistic RA Percent Paced: 60 %
Brady Statistic RV Percent Paced: 0.09 %
Date Time Interrogation Session: 20240412073724
HighPow Impedance: 52 Ohm
HighPow Impedance: 68 Ohm
Implantable Lead Connection Status: 753985
Implantable Lead Connection Status: 753985
Implantable Lead Implant Date: 20111111
Implantable Lead Implant Date: 20111111
Implantable Lead Location: 753859
Implantable Lead Location: 753860
Implantable Lead Model: 5076
Implantable Lead Model: 6947
Implantable Pulse Generator Implant Date: 20200506
Lead Channel Impedance Value: 380 Ohm
Lead Channel Impedance Value: 456 Ohm
Lead Channel Impedance Value: 456 Ohm
Lead Channel Pacing Threshold Amplitude: 0.5 V
Lead Channel Pacing Threshold Amplitude: 0.75 V
Lead Channel Pacing Threshold Pulse Width: 0.4 ms
Lead Channel Pacing Threshold Pulse Width: 0.4 ms
Lead Channel Sensing Intrinsic Amplitude: 15.25 mV
Lead Channel Sensing Intrinsic Amplitude: 15.25 mV
Lead Channel Sensing Intrinsic Amplitude: 2.5 mV
Lead Channel Sensing Intrinsic Amplitude: 2.5 mV
Lead Channel Setting Pacing Amplitude: 2 V
Lead Channel Setting Pacing Amplitude: 2.5 V
Lead Channel Setting Pacing Pulse Width: 0.4 ms
Lead Channel Setting Sensing Sensitivity: 0.3 mV
Zone Setting Status: 755011
Zone Setting Status: 755011

## 2023-01-21 NOTE — Progress Notes (Signed)
Remote ICD transmission.   

## 2023-02-17 DIAGNOSIS — Z1331 Encounter for screening for depression: Secondary | ICD-10-CM | POA: Diagnosis not present

## 2023-02-17 DIAGNOSIS — Z9181 History of falling: Secondary | ICD-10-CM | POA: Diagnosis not present

## 2023-02-17 DIAGNOSIS — Z0000 Encounter for general adult medical examination without abnormal findings: Secondary | ICD-10-CM | POA: Diagnosis not present

## 2023-03-19 ENCOUNTER — Ambulatory Visit (INDEPENDENT_AMBULATORY_CARE_PROVIDER_SITE_OTHER): Payer: Medicare PPO

## 2023-03-19 DIAGNOSIS — I255 Ischemic cardiomyopathy: Secondary | ICD-10-CM | POA: Diagnosis not present

## 2023-03-19 LAB — CUP PACEART REMOTE DEVICE CHECK
Battery Remaining Longevity: 70 mo
Battery Voltage: 2.98 V
Brady Statistic AP VP Percent: 0.06 %
Brady Statistic AP VS Percent: 51.3 %
Brady Statistic AS VP Percent: 0.01 %
Brady Statistic AS VS Percent: 48.63 %
Brady Statistic RA Percent Paced: 51 %
Brady Statistic RV Percent Paced: 0.09 %
Date Time Interrogation Session: 20240712033623
HighPow Impedance: 55 Ohm
HighPow Impedance: 73 Ohm
Implantable Lead Connection Status: 753985
Implantable Lead Connection Status: 753985
Implantable Lead Implant Date: 20111111
Implantable Lead Implant Date: 20111111
Implantable Lead Location: 753859
Implantable Lead Location: 753860
Implantable Lead Model: 5076
Implantable Lead Model: 6947
Implantable Pulse Generator Implant Date: 20200506
Lead Channel Impedance Value: 399 Ohm
Lead Channel Impedance Value: 494 Ohm
Lead Channel Impedance Value: 513 Ohm
Lead Channel Pacing Threshold Amplitude: 0.625 V
Lead Channel Pacing Threshold Amplitude: 0.75 V
Lead Channel Pacing Threshold Pulse Width: 0.4 ms
Lead Channel Pacing Threshold Pulse Width: 0.4 ms
Lead Channel Sensing Intrinsic Amplitude: 15.5 mV
Lead Channel Sensing Intrinsic Amplitude: 15.5 mV
Lead Channel Sensing Intrinsic Amplitude: 3.625 mV
Lead Channel Sensing Intrinsic Amplitude: 3.625 mV
Lead Channel Setting Pacing Amplitude: 2 V
Lead Channel Setting Pacing Amplitude: 2.5 V
Lead Channel Setting Pacing Pulse Width: 0.4 ms
Lead Channel Setting Sensing Sensitivity: 0.3 mV
Zone Setting Status: 755011
Zone Setting Status: 755011

## 2023-04-05 NOTE — Progress Notes (Signed)
Remote ICD transmission.   

## 2023-04-09 DIAGNOSIS — E785 Hyperlipidemia, unspecified: Secondary | ICD-10-CM | POA: Diagnosis not present

## 2023-04-09 DIAGNOSIS — E1165 Type 2 diabetes mellitus with hyperglycemia: Secondary | ICD-10-CM | POA: Diagnosis not present

## 2023-04-09 DIAGNOSIS — E1169 Type 2 diabetes mellitus with other specified complication: Secondary | ICD-10-CM | POA: Diagnosis not present

## 2023-04-09 DIAGNOSIS — I2510 Atherosclerotic heart disease of native coronary artery without angina pectoris: Secondary | ICD-10-CM | POA: Diagnosis not present

## 2023-04-09 DIAGNOSIS — I10 Essential (primary) hypertension: Secondary | ICD-10-CM | POA: Diagnosis not present

## 2023-04-09 DIAGNOSIS — Z6830 Body mass index (BMI) 30.0-30.9, adult: Secondary | ICD-10-CM | POA: Diagnosis not present

## 2023-04-09 DIAGNOSIS — Z139 Encounter for screening, unspecified: Secondary | ICD-10-CM | POA: Diagnosis not present

## 2023-06-18 ENCOUNTER — Ambulatory Visit (INDEPENDENT_AMBULATORY_CARE_PROVIDER_SITE_OTHER): Payer: Medicare PPO

## 2023-06-18 DIAGNOSIS — I255 Ischemic cardiomyopathy: Secondary | ICD-10-CM | POA: Diagnosis not present

## 2023-06-18 LAB — CUP PACEART REMOTE DEVICE CHECK
Battery Remaining Longevity: 63 mo
Battery Voltage: 2.98 V
Brady Statistic AP VP Percent: 0.07 %
Brady Statistic AP VS Percent: 61.14 %
Brady Statistic AS VP Percent: 0 %
Brady Statistic AS VS Percent: 38.78 %
Brady Statistic RA Percent Paced: 59.94 %
Brady Statistic RV Percent Paced: 0.1 %
Date Time Interrogation Session: 20241011063523
HighPow Impedance: 55 Ohm
HighPow Impedance: 74 Ohm
Implantable Lead Connection Status: 753985
Implantable Lead Connection Status: 753985
Implantable Lead Implant Date: 20111111
Implantable Lead Implant Date: 20111111
Implantable Lead Location: 753859
Implantable Lead Location: 753860
Implantable Lead Model: 5076
Implantable Lead Model: 6947
Implantable Pulse Generator Implant Date: 20200506
Lead Channel Impedance Value: 437 Ohm
Lead Channel Impedance Value: 513 Ohm
Lead Channel Impedance Value: 703 Ohm
Lead Channel Pacing Threshold Amplitude: 0.75 V
Lead Channel Pacing Threshold Amplitude: 0.875 V
Lead Channel Pacing Threshold Pulse Width: 0.4 ms
Lead Channel Pacing Threshold Pulse Width: 0.4 ms
Lead Channel Sensing Intrinsic Amplitude: 15.25 mV
Lead Channel Sensing Intrinsic Amplitude: 15.25 mV
Lead Channel Sensing Intrinsic Amplitude: 3.25 mV
Lead Channel Sensing Intrinsic Amplitude: 3.25 mV
Lead Channel Setting Pacing Amplitude: 2 V
Lead Channel Setting Pacing Amplitude: 2.5 V
Lead Channel Setting Pacing Pulse Width: 0.4 ms
Lead Channel Setting Sensing Sensitivity: 0.3 mV
Zone Setting Status: 755011
Zone Setting Status: 755011

## 2023-06-28 NOTE — Progress Notes (Signed)
Remote ICD transmission.   

## 2023-08-03 DIAGNOSIS — I110 Hypertensive heart disease with heart failure: Secondary | ICD-10-CM | POA: Diagnosis not present

## 2023-08-03 DIAGNOSIS — E1159 Type 2 diabetes mellitus with other circulatory complications: Secondary | ICD-10-CM | POA: Diagnosis not present

## 2023-08-03 DIAGNOSIS — I255 Ischemic cardiomyopathy: Secondary | ICD-10-CM | POA: Diagnosis not present

## 2023-08-03 DIAGNOSIS — Z79899 Other long term (current) drug therapy: Secondary | ICD-10-CM | POA: Diagnosis not present

## 2023-08-03 DIAGNOSIS — E782 Mixed hyperlipidemia: Secondary | ICD-10-CM | POA: Diagnosis not present

## 2023-08-03 DIAGNOSIS — I2510 Atherosclerotic heart disease of native coronary artery without angina pectoris: Secondary | ICD-10-CM | POA: Diagnosis not present

## 2023-08-03 DIAGNOSIS — Z6829 Body mass index (BMI) 29.0-29.9, adult: Secondary | ICD-10-CM | POA: Diagnosis not present

## 2023-08-10 DIAGNOSIS — E1159 Type 2 diabetes mellitus with other circulatory complications: Secondary | ICD-10-CM | POA: Diagnosis not present

## 2023-08-10 DIAGNOSIS — E119 Type 2 diabetes mellitus without complications: Secondary | ICD-10-CM | POA: Diagnosis not present

## 2023-08-10 DIAGNOSIS — Z6830 Body mass index (BMI) 30.0-30.9, adult: Secondary | ICD-10-CM | POA: Diagnosis not present

## 2023-08-10 DIAGNOSIS — Z794 Long term (current) use of insulin: Secondary | ICD-10-CM | POA: Diagnosis not present

## 2023-09-17 ENCOUNTER — Ambulatory Visit (INDEPENDENT_AMBULATORY_CARE_PROVIDER_SITE_OTHER): Payer: Medicare HMO

## 2023-09-17 DIAGNOSIS — I255 Ischemic cardiomyopathy: Secondary | ICD-10-CM | POA: Diagnosis not present

## 2023-09-18 LAB — CUP PACEART REMOTE DEVICE CHECK
Battery Remaining Longevity: 60 mo
Battery Voltage: 2.98 V
Brady Statistic AP VP Percent: 0.06 %
Brady Statistic AP VS Percent: 54.75 %
Brady Statistic AS VP Percent: 0.01 %
Brady Statistic AS VS Percent: 45.19 %
Brady Statistic RA Percent Paced: 54.27 %
Brady Statistic RV Percent Paced: 0.08 %
Date Time Interrogation Session: 20250110073823
HighPow Impedance: 55 Ohm
HighPow Impedance: 73 Ohm
Implantable Lead Connection Status: 753985
Implantable Lead Connection Status: 753985
Implantable Lead Implant Date: 20111111
Implantable Lead Implant Date: 20111111
Implantable Lead Location: 753859
Implantable Lead Location: 753860
Implantable Lead Model: 5076
Implantable Lead Model: 6947
Implantable Pulse Generator Implant Date: 20200506
Lead Channel Impedance Value: 399 Ohm
Lead Channel Impedance Value: 494 Ohm
Lead Channel Impedance Value: 703 Ohm
Lead Channel Pacing Threshold Amplitude: 0.75 V
Lead Channel Pacing Threshold Amplitude: 0.875 V
Lead Channel Pacing Threshold Pulse Width: 0.4 ms
Lead Channel Pacing Threshold Pulse Width: 0.4 ms
Lead Channel Sensing Intrinsic Amplitude: 15.375 mV
Lead Channel Sensing Intrinsic Amplitude: 15.375 mV
Lead Channel Sensing Intrinsic Amplitude: 3 mV
Lead Channel Sensing Intrinsic Amplitude: 3 mV
Lead Channel Setting Pacing Amplitude: 2 V
Lead Channel Setting Pacing Amplitude: 2.5 V
Lead Channel Setting Pacing Pulse Width: 0.4 ms
Lead Channel Setting Sensing Sensitivity: 0.3 mV
Zone Setting Status: 755011
Zone Setting Status: 755011

## 2023-10-05 ENCOUNTER — Ambulatory Visit: Payer: Medicare PPO | Admitting: Student

## 2023-10-08 DIAGNOSIS — Z794 Long term (current) use of insulin: Secondary | ICD-10-CM | POA: Diagnosis not present

## 2023-10-08 DIAGNOSIS — Z6831 Body mass index (BMI) 31.0-31.9, adult: Secondary | ICD-10-CM | POA: Diagnosis not present

## 2023-10-08 DIAGNOSIS — M25511 Pain in right shoulder: Secondary | ICD-10-CM | POA: Diagnosis not present

## 2023-10-08 DIAGNOSIS — E1159 Type 2 diabetes mellitus with other circulatory complications: Secondary | ICD-10-CM | POA: Diagnosis not present

## 2023-10-11 NOTE — Progress Notes (Deleted)
  Cardiology Office Note:  .   Date:  10/11/2023  ID:  Zachary Berry, DOB [birth year], MRN [id number] PCP: Freddrick Johns  [facility] Providers Cardiologist:  Candyce Reek, MD Electrophysiologist:  Lynwood Rakers, MD (Inactive) {  History of Present Illness: .   Zachary Berry is a 68 y.o. male w/PMHx of CAD (s/p Ant STEMI >> LHC (04/03/2009):  EF 30%, proximal LAD occluded >>> PCI:  Xience DES to prox LAD.;  b.  LHC (9/15): Proximal LAD stent patent), HTN, DM HLD, ICM, ICD, chronic CHF (systolic)  He saw dr. Rakers in 2020  Dr. Reek 12/12/19, doing well, no changes were made  Last visit was with A. Tillery, PA, again doing well, no ICD shocks, no changes were made   Today's visit is scheduled as an over due annual visit  ROS:   *** way overdue for cards >> needs new doc *** CL is reading remotes *** symptoms *** volume *** labs, lytes? Lipids?   Device information MDT dual chamber ICD implanted 07/18/2010  Arrhythmia/AAD hx None to date  Studies Reviewed: SABRA    EKG done today and reviewed by myself:  ***  DEVICE interrogation done today and reviewed by myself *** Battery and lead measurements are good ***   12/30/2018: TTE 1. The left ventricle has moderately reduced systolic function, with an  ejection fraction of 35-40%. The cavity size was mildly dilated. Left  ventricular diastolic Doppler parameters are indeterminate. Left  ventricular diffuse hypokinesis.   2. Severe hypokinesis of the left ventricular, entire inferoseptal wall.   3. Severe hypokinesis of the left ventricular, mid-apical anteroseptal  wall.   4. The right ventricle has normal systolic function. The cavity was  normal. There is no increase in right ventricular wall thickness.   5. The mitral valve is grossly normal.   6. Tricuspid valve regurgitation is mild-moderate.   7. The aortic valve is tricuspid.   8. The aortic arch is normal in size and structure.   9. There is mild  to moderate dilatation of the ascending aorta measuring  42 mm.  10. The inferior vena cava was normal in size with <50% respiratory  variability.    Risk Assessment/Calculations:    Physical Exam:   VS:  There were no vitals taken for this visit.   Wt Readings from Last 3 Encounters:  05/01/20 185 lb (83.9 kg)  12/12/19 181 lb 6.4 oz (82.3 kg)  04/17/19 179 lb (81.2 kg)    GEN: Well nourished, well developed in no acute distress NECK: No JVD; No carotid bruits CARDIAC: ***RRR, no murmurs, rubs, gallops RESPIRATORY:  *** CTA b/l without rales, wheezing or rhonchi  ABDOMEN: Soft, non-tender, non-distended EXTREMITIES: *** No edema; No deformity   ICD site: *** is stable, no thinning, fluctuation, tethering  ASSESSMENT AND PLAN: .    ICD *** intact function *** no programming changes made  CAD *** Needs to catch up with cardiology team ***  ICM Chronic CHF (systolic) *** Plan cards f/u  HTN ***      {Are you ordering a CV Procedure (e.g. stress test, cath, DCCV, TEE, etc)?   Press F2        :[id number]}     Dispo: ***  Signed, Charlies Macario Arthur, PA-C

## 2023-10-13 ENCOUNTER — Ambulatory Visit: Payer: Medicare HMO | Admitting: Physician Assistant

## 2023-10-22 DIAGNOSIS — F324 Major depressive disorder, single episode, in partial remission: Secondary | ICD-10-CM | POA: Diagnosis not present

## 2023-10-22 DIAGNOSIS — I110 Hypertensive heart disease with heart failure: Secondary | ICD-10-CM | POA: Diagnosis not present

## 2023-10-22 DIAGNOSIS — E66811 Obesity, class 1: Secondary | ICD-10-CM | POA: Diagnosis not present

## 2023-10-22 DIAGNOSIS — E1159 Type 2 diabetes mellitus with other circulatory complications: Secondary | ICD-10-CM | POA: Diagnosis not present

## 2023-10-22 DIAGNOSIS — Z794 Long term (current) use of insulin: Secondary | ICD-10-CM | POA: Diagnosis not present

## 2023-10-22 DIAGNOSIS — Z6830 Body mass index (BMI) 30.0-30.9, adult: Secondary | ICD-10-CM | POA: Diagnosis not present

## 2023-10-22 DIAGNOSIS — M25511 Pain in right shoulder: Secondary | ICD-10-CM | POA: Diagnosis not present

## 2023-10-22 DIAGNOSIS — E6609 Other obesity due to excess calories: Secondary | ICD-10-CM | POA: Diagnosis not present

## 2023-10-26 NOTE — Progress Notes (Signed)
 Remote ICD transmission.

## 2023-10-26 NOTE — Addendum Note (Signed)
 Addended by: Elease Etienne A on: 10/26/2023 11:57 AM   Modules accepted: Orders

## 2023-11-01 NOTE — Progress Notes (Unsigned)
  Cardiology Office Note:  .   Date:  11/01/2023  ID:  Zachary Berry, DOB 08/06/1956, MRN [id number] PCP: Aviva Kluver  [facility] Providers Cardiologist:  Lance Muss, MD Electrophysiologist:  Hillis Range, MD (Inactive) {  History of Present Illness: .   Zachary Berry is a 68 y.o. male w/PMHx of CAD (s/p Ant STEMI >> LHC (04/03/2009):  EF 30%, proximal LAD occluded >>> PCI:  Xience DES to prox LAD.;  b.  LHC (9/15): Proximal LAD stent patent), HTN, DM HLD, ICM, ICD, chronic CHF (systolic)  He saw dr. Johney Frame in 2020  Dr. Eldridge Dace 12/12/19, doing well, no changes were made  Last visit was with A. Tillery, PA, again doing well, no ICD shocks, no changes were made   Today's visit is scheduled as an over due annual visit  ROS:   *** way overdue for cards >> needs new doc *** CL is reading remotes *** symptoms *** volume *** labs, lytes? Lipids?   Device information MDT dual chamber ICD implanted 07/18/2010  Arrhythmia/AAD hx None to date  Studies Reviewed: Marland Kitchen    EKG done today and reviewed by myself:  ***  DEVICE interrogation done today and reviewed by myself *** Battery and lead measurements are good ***   12/30/2018: TTE 1. The left ventricle has moderately reduced systolic function, with an  ejection fraction of 35-40%. The cavity size was mildly dilated. Left  ventricular diastolic Doppler parameters are indeterminate. Left  ventricular diffuse hypokinesis.   2. Severe hypokinesis of the left ventricular, entire inferoseptal wall.   3. Severe hypokinesis of the left ventricular, mid-apical anteroseptal  wall.   4. The right ventricle has normal systolic function. The cavity was  normal. There is no increase in right ventricular wall thickness.   5. The mitral valve is grossly normal.   6. Tricuspid valve regurgitation is mild-moderate.   7. The aortic valve is tricuspid.   8. The aortic arch is normal in size and structure.   9. There is mild  to moderate dilatation of the ascending aorta measuring  42 mm.  10. The inferior vena cava was normal in size with <50% respiratory  variability.    Risk Assessment/Calculations:    Physical Exam:   VS:  There were no vitals taken for this visit.   Wt Readings from Last 3 Encounters:  05/01/20 185 lb (83.9 kg)  12/12/19 181 lb 6.4 oz (82.3 kg)  04/17/19 179 lb (81.2 kg)    GEN: Well nourished, well developed in no acute distress NECK: No JVD; No carotid bruits CARDIAC: ***RRR, no murmurs, rubs, gallops RESPIRATORY:  *** CTA b/l without rales, wheezing or rhonchi  ABDOMEN: Soft, non-tender, non-distended EXTREMITIES: *** No edema; No deformity   ICD site: *** is stable, no thinning, fluctuation, tethering  ASSESSMENT AND PLAN: .    ICD *** intact function *** no programming changes made  CAD *** Needs to catch up with cardiology team ***  ICM Chronic CHF (systolic) *** Plan cards f/u  HTN ***      {Are you ordering a CV Procedure (e.g. stress test, cath, DCCV, TEE, etc)?   Press F2        :[id number]}     Dispo: ***  Signed, Sheilah Pigeon, PA-C

## 2023-11-03 ENCOUNTER — Ambulatory Visit: Payer: Medicare HMO | Attending: Physician Assistant | Admitting: Physician Assistant

## 2023-11-03 ENCOUNTER — Encounter: Payer: Self-pay | Admitting: Physician Assistant

## 2023-11-03 VITALS — BP 124/80 | HR 67 | Ht 68.0 in | Wt 193.4 lb

## 2023-11-03 DIAGNOSIS — Z95810 Presence of automatic (implantable) cardiac defibrillator: Secondary | ICD-10-CM | POA: Diagnosis not present

## 2023-11-03 DIAGNOSIS — I5022 Chronic systolic (congestive) heart failure: Secondary | ICD-10-CM

## 2023-11-03 DIAGNOSIS — I2510 Atherosclerotic heart disease of native coronary artery without angina pectoris: Secondary | ICD-10-CM | POA: Diagnosis not present

## 2023-11-03 DIAGNOSIS — I255 Ischemic cardiomyopathy: Secondary | ICD-10-CM

## 2023-11-03 DIAGNOSIS — I10 Essential (primary) hypertension: Secondary | ICD-10-CM

## 2023-11-03 LAB — CUP PACEART INCLINIC DEVICE CHECK
Battery Remaining Longevity: 59 mo
Battery Voltage: 2.98 V
Brady Statistic AP VP Percent: 0.04 %
Brady Statistic AP VS Percent: 42.63 %
Brady Statistic AS VP Percent: 0.01 %
Brady Statistic AS VS Percent: 57.32 %
Brady Statistic RA Percent Paced: 42.46 %
Brady Statistic RV Percent Paced: 0.06 %
Date Time Interrogation Session: 20250226171339
HighPow Impedance: 64 Ohm
HighPow Impedance: 92 Ohm
Implantable Lead Connection Status: 753985
Implantable Lead Connection Status: 753985
Implantable Lead Implant Date: 20111111
Implantable Lead Implant Date: 20111111
Implantable Lead Location: 753859
Implantable Lead Location: 753860
Implantable Lead Model: 5076
Implantable Lead Model: 6947
Implantable Pulse Generator Implant Date: 20200506
Lead Channel Impedance Value: 456 Ohm
Lead Channel Impedance Value: 513 Ohm
Lead Channel Impedance Value: 760 Ohm
Lead Channel Pacing Threshold Amplitude: 0.75 V
Lead Channel Pacing Threshold Amplitude: 1 V
Lead Channel Pacing Threshold Pulse Width: 0.4 ms
Lead Channel Pacing Threshold Pulse Width: 0.4 ms
Lead Channel Sensing Intrinsic Amplitude: 14.375 mV
Lead Channel Sensing Intrinsic Amplitude: 17.375 mV
Lead Channel Sensing Intrinsic Amplitude: 2.75 mV
Lead Channel Sensing Intrinsic Amplitude: 3.75 mV
Lead Channel Setting Pacing Amplitude: 2 V
Lead Channel Setting Pacing Amplitude: 2.5 V
Lead Channel Setting Pacing Pulse Width: 0.4 ms
Lead Channel Setting Sensing Sensitivity: 0.3 mV
Zone Setting Status: 755011
Zone Setting Status: 755011

## 2023-11-03 MED ORDER — NITROGLYCERIN 0.4 MG SL SUBL: 0.4000 mg | SUBLINGUAL_TABLET | SUBLINGUAL | 3 refills | Status: AC | PRN

## 2023-11-03 NOTE — Patient Instructions (Addendum)
 Medication Instructions:   Your physician recommends that you continue on your current medications as directed. Please refer to the Current Medication list given to you today.    *If you need a refill on your cardiac medications before your next appointment, please call your pharmacy*   Lab Work:  NONE ORDERED  TODAY    If you have labs (blood work) drawn today and your tests are completely normal, you will receive your results only by: MyChart Message (if you have MyChart) OR A paper copy in the mail If you have any lab test that is abnormal or we need to change your treatment, we will call you to review the results.   Testing/Procedures: NONE ORDERED  TODAY    Follow-Up: At [facility], you and your health needs are our priority.  As part of our continuing mission to provide you with exceptional heart care, we have created designated Provider Care Teams.  These Care Teams include your primary Cardiologist (physician) and Advanced Practice Providers (APPs -  Physician Assistants and Nurse Practitioners) who all work together to provide you with the care you need, when you need it.  We recommend signing up for the patient portal called "MyChart".  Sign up information is provided on this After Visit Summary.  MyChart is used to connect with patients for Virtual Visits (Telemedicine).  Patients are able to view lab/test results, encounter notes, upcoming appointments, etc.  Non-urgent messages can be sent to your provider as well.   To learn more about what you can do with MyChart, go to [email or web address].    Your next appointment:   NEW PATIENT AVAILABLE CARDIOLOGIST  IN [place] TO BE ESTABLISHED WITH   1 year(s)   Provider:   You may see  Dr, Lalla Brothers    Other Instructions    1st Floor: - Lobby - Registration  - Pharmacy  - Lab - Cafe  2nd Floor: - PV Lab - Diagnostic Testing (echo, CT, nuclear med)  3rd Floor: - Vacant  4th Floor: - TCTS  (cardiothoracic surgery) - AFib Clinic - Structural Heart Clinic - Vascular Surgery  - Vascular Ultrasound  5th Floor: - [facility] (general and EP) - Clinical Pharmacy for coumadin, hypertension, lipid, weight-loss medications, and med management appointments    Valet parking services will be available as well.

## 2023-11-19 DIAGNOSIS — E782 Mixed hyperlipidemia: Secondary | ICD-10-CM | POA: Diagnosis not present

## 2023-11-19 DIAGNOSIS — E663 Overweight: Secondary | ICD-10-CM | POA: Diagnosis not present

## 2023-11-19 DIAGNOSIS — E1159 Type 2 diabetes mellitus with other circulatory complications: Secondary | ICD-10-CM | POA: Diagnosis not present

## 2023-11-19 DIAGNOSIS — Z794 Long term (current) use of insulin: Secondary | ICD-10-CM | POA: Diagnosis not present

## 2023-11-19 DIAGNOSIS — I255 Ischemic cardiomyopathy: Secondary | ICD-10-CM | POA: Diagnosis not present

## 2023-11-19 DIAGNOSIS — I5022 Chronic systolic (congestive) heart failure: Secondary | ICD-10-CM | POA: Diagnosis not present

## 2023-11-19 DIAGNOSIS — I152 Hypertension secondary to endocrine disorders: Secondary | ICD-10-CM | POA: Diagnosis not present

## 2023-11-19 DIAGNOSIS — Z6828 Body mass index (BMI) 28.0-28.9, adult: Secondary | ICD-10-CM | POA: Diagnosis not present

## 2023-12-17 ENCOUNTER — Ambulatory Visit (INDEPENDENT_AMBULATORY_CARE_PROVIDER_SITE_OTHER): Payer: Medicare HMO

## 2023-12-17 DIAGNOSIS — I255 Ischemic cardiomyopathy: Secondary | ICD-10-CM | POA: Diagnosis not present

## 2023-12-20 LAB — CUP PACEART REMOTE DEVICE CHECK
Battery Remaining Longevity: 53 mo
Battery Voltage: 2.98 V
Brady Statistic AP VP Percent: 0.04 %
Brady Statistic AP VS Percent: 46.33 %
Brady Statistic AS VP Percent: 0.01 %
Brady Statistic AS VS Percent: 53.61 %
Brady Statistic RA Percent Paced: 46.08 %
Brady Statistic RV Percent Paced: 0.07 %
Date Time Interrogation Session: 20250411033622
HighPow Impedance: 52 Ohm
HighPow Impedance: 72 Ohm
Implantable Lead Connection Status: 753985
Implantable Lead Connection Status: 753985
Implantable Lead Implant Date: 20111111
Implantable Lead Implant Date: 20111111
Implantable Lead Location: 753859
Implantable Lead Location: 753860
Implantable Lead Model: 5076
Implantable Lead Model: 6947
Implantable Pulse Generator Implant Date: 20200506
Lead Channel Impedance Value: 399 Ohm
Lead Channel Impedance Value: 494 Ohm
Lead Channel Impedance Value: 703 Ohm
Lead Channel Pacing Threshold Amplitude: 0.75 V
Lead Channel Pacing Threshold Amplitude: 0.875 V
Lead Channel Pacing Threshold Pulse Width: 0.4 ms
Lead Channel Pacing Threshold Pulse Width: 0.4 ms
Lead Channel Sensing Intrinsic Amplitude: 13.625 mV
Lead Channel Sensing Intrinsic Amplitude: 13.625 mV
Lead Channel Sensing Intrinsic Amplitude: 2.5 mV
Lead Channel Sensing Intrinsic Amplitude: 2.5 mV
Lead Channel Setting Pacing Amplitude: 2 V
Lead Channel Setting Pacing Amplitude: 2.5 V
Lead Channel Setting Pacing Pulse Width: 0.4 ms
Lead Channel Setting Sensing Sensitivity: 0.3 mV
Zone Setting Status: 755011
Zone Setting Status: 755011

## 2024-01-24 NOTE — Progress Notes (Signed)
 Remote ICD transmission.

## 2024-01-24 NOTE — Addendum Note (Signed)
 Addended by: Lott Rouleau A on: 01/24/2024 07:40 AM   Modules accepted: Orders

## 2024-02-15 DIAGNOSIS — E782 Mixed hyperlipidemia: Secondary | ICD-10-CM | POA: Diagnosis not present

## 2024-02-15 DIAGNOSIS — Z794 Long term (current) use of insulin: Secondary | ICD-10-CM | POA: Diagnosis not present

## 2024-02-15 DIAGNOSIS — I152 Hypertension secondary to endocrine disorders: Secondary | ICD-10-CM | POA: Diagnosis not present

## 2024-02-15 DIAGNOSIS — F324 Major depressive disorder, single episode, in partial remission: Secondary | ICD-10-CM | POA: Diagnosis not present

## 2024-02-15 DIAGNOSIS — E1159 Type 2 diabetes mellitus with other circulatory complications: Secondary | ICD-10-CM | POA: Diagnosis not present

## 2024-02-15 DIAGNOSIS — Z6828 Body mass index (BMI) 28.0-28.9, adult: Secondary | ICD-10-CM | POA: Diagnosis not present

## 2024-02-23 DIAGNOSIS — E1159 Type 2 diabetes mellitus with other circulatory complications: Secondary | ICD-10-CM | POA: Diagnosis not present

## 2024-03-16 DIAGNOSIS — Z9181 History of falling: Secondary | ICD-10-CM | POA: Diagnosis not present

## 2024-03-16 DIAGNOSIS — Z0000 Encounter for general adult medical examination without abnormal findings: Secondary | ICD-10-CM | POA: Diagnosis not present

## 2024-03-17 ENCOUNTER — Ambulatory Visit: Payer: Medicare HMO

## 2024-03-17 DIAGNOSIS — I255 Ischemic cardiomyopathy: Secondary | ICD-10-CM | POA: Diagnosis not present

## 2024-03-18 ENCOUNTER — Ambulatory Visit: Payer: Self-pay | Admitting: Cardiology

## 2024-03-18 LAB — CUP PACEART REMOTE DEVICE CHECK
Battery Remaining Longevity: 48 mo
Battery Voltage: 2.98 V
Brady Statistic AP VP Percent: 0.05 %
Brady Statistic AP VS Percent: 42.43 %
Brady Statistic AS VP Percent: 0.01 %
Brady Statistic AS VS Percent: 57.51 %
Brady Statistic RA Percent Paced: 42.2 %
Brady Statistic RV Percent Paced: 0.08 %
Date Time Interrogation Session: 20250711012203
HighPow Impedance: 55 Ohm
HighPow Impedance: 76 Ohm
Implantable Lead Connection Status: 753985
Implantable Lead Connection Status: 753985
Implantable Lead Implant Date: 20111111
Implantable Lead Implant Date: 20111111
Implantable Lead Location: 753859
Implantable Lead Location: 753860
Implantable Lead Model: 5076
Implantable Lead Model: 6947
Implantable Pulse Generator Implant Date: 20200506
Lead Channel Impedance Value: 399 Ohm
Lead Channel Impedance Value: 494 Ohm
Lead Channel Impedance Value: 760 Ohm
Lead Channel Pacing Threshold Amplitude: 0.75 V
Lead Channel Pacing Threshold Amplitude: 0.875 V
Lead Channel Pacing Threshold Pulse Width: 0.4 ms
Lead Channel Pacing Threshold Pulse Width: 0.4 ms
Lead Channel Sensing Intrinsic Amplitude: 16.625 mV
Lead Channel Sensing Intrinsic Amplitude: 16.625 mV
Lead Channel Sensing Intrinsic Amplitude: 2.375 mV
Lead Channel Sensing Intrinsic Amplitude: 2.375 mV
Lead Channel Setting Pacing Amplitude: 2 V
Lead Channel Setting Pacing Amplitude: 2.5 V
Lead Channel Setting Pacing Pulse Width: 0.4 ms
Lead Channel Setting Sensing Sensitivity: 0.3 mV
Zone Setting Status: 755011
Zone Setting Status: 755011

## 2024-06-15 NOTE — Progress Notes (Signed)
 Remote ICD Transmission

## 2024-06-16 ENCOUNTER — Encounter

## 2024-06-30 DIAGNOSIS — Z794 Long term (current) use of insulin: Secondary | ICD-10-CM | POA: Diagnosis not present

## 2024-06-30 DIAGNOSIS — E782 Mixed hyperlipidemia: Secondary | ICD-10-CM | POA: Diagnosis not present

## 2024-06-30 DIAGNOSIS — Z125 Encounter for screening for malignant neoplasm of prostate: Secondary | ICD-10-CM | POA: Diagnosis not present

## 2024-06-30 DIAGNOSIS — I10 Essential (primary) hypertension: Secondary | ICD-10-CM | POA: Diagnosis not present

## 2024-06-30 DIAGNOSIS — I255 Ischemic cardiomyopathy: Secondary | ICD-10-CM | POA: Diagnosis not present

## 2024-06-30 DIAGNOSIS — I2510 Atherosclerotic heart disease of native coronary artery without angina pectoris: Secondary | ICD-10-CM | POA: Diagnosis not present

## 2024-06-30 DIAGNOSIS — F324 Major depressive disorder, single episode, in partial remission: Secondary | ICD-10-CM | POA: Diagnosis not present

## 2024-06-30 DIAGNOSIS — I5022 Chronic systolic (congestive) heart failure: Secondary | ICD-10-CM | POA: Diagnosis not present

## 2024-06-30 DIAGNOSIS — E1159 Type 2 diabetes mellitus with other circulatory complications: Secondary | ICD-10-CM | POA: Diagnosis not present

## 2024-09-15 ENCOUNTER — Encounter

## 2024-12-15 ENCOUNTER — Encounter

## 2025-03-16 ENCOUNTER — Encounter

## 2025-06-15 ENCOUNTER — Encounter
